# Patient Record
Sex: Female | Born: 1935 | Race: Black or African American | Hispanic: No | Marital: Married | Smoking: Former smoker
Health system: Southern US, Community
[De-identification: ages and names within clinical notes are randomized; demographics above are authoritative.]

## PROBLEM LIST (undated history)

## (undated) DIAGNOSIS — K5909 Other constipation: Secondary | ICD-10-CM

## (undated) DIAGNOSIS — I2510 Atherosclerotic heart disease of native coronary artery without angina pectoris: Secondary | ICD-10-CM

## (undated) DIAGNOSIS — E785 Hyperlipidemia, unspecified: Secondary | ICD-10-CM

## (undated) DIAGNOSIS — J189 Pneumonia, unspecified organism: Secondary | ICD-10-CM

## (undated) DIAGNOSIS — D649 Anemia, unspecified: Secondary | ICD-10-CM

## (undated) DIAGNOSIS — I6529 Occlusion and stenosis of unspecified carotid artery: Secondary | ICD-10-CM

## (undated) DIAGNOSIS — I10 Essential (primary) hypertension: Secondary | ICD-10-CM

## (undated) DIAGNOSIS — M1990 Unspecified osteoarthritis, unspecified site: Secondary | ICD-10-CM

## (undated) DIAGNOSIS — R05 Cough: Secondary | ICD-10-CM

## (undated) DIAGNOSIS — N189 Chronic kidney disease, unspecified: Secondary | ICD-10-CM

## (undated) DIAGNOSIS — R600 Localized edema: Secondary | ICD-10-CM

## (undated) DIAGNOSIS — J8410 Pulmonary fibrosis, unspecified: Secondary | ICD-10-CM

## (undated) DIAGNOSIS — R058 Other specified cough: Secondary | ICD-10-CM

## (undated) DIAGNOSIS — Z8619 Personal history of other infectious and parasitic diseases: Secondary | ICD-10-CM

## (undated) DIAGNOSIS — I5181 Takotsubo syndrome: Secondary | ICD-10-CM

## (undated) DIAGNOSIS — R609 Edema, unspecified: Secondary | ICD-10-CM

## (undated) DIAGNOSIS — M4802 Spinal stenosis, cervical region: Secondary | ICD-10-CM

## (undated) HISTORY — DX: Pneumonia, unspecified organism: J18.9

## (undated) HISTORY — DX: Spinal stenosis, cervical region: M48.02

## (undated) HISTORY — PX: CHOLECYSTECTOMY: SHX55

## (undated) HISTORY — DX: Personal history of other infectious and parasitic diseases: Z86.19

## (undated) HISTORY — DX: Anemia, unspecified: D64.9

## (undated) HISTORY — DX: Chronic kidney disease, unspecified: N18.9

## (undated) HISTORY — DX: Atherosclerotic heart disease of native coronary artery without angina pectoris: I25.10

## (undated) HISTORY — PX: ABDOMINAL HYSTERECTOMY: SHX81

## (undated) HISTORY — DX: Takotsubo syndrome: I51.81

## (undated) HISTORY — DX: Hyperlipidemia, unspecified: E78.5

## (undated) HISTORY — DX: Occlusion and stenosis of unspecified carotid artery: I65.29

---

## 1997-07-04 ENCOUNTER — Other Ambulatory Visit: Admission: RE | Admit: 1997-07-04 | Discharge: 1997-07-04 | Payer: Self-pay | Admitting: Nephrology

## 1998-08-12 ENCOUNTER — Encounter: Payer: Self-pay | Admitting: Surgery

## 1998-08-12 ENCOUNTER — Ambulatory Visit (HOSPITAL_COMMUNITY): Admission: RE | Admit: 1998-08-12 | Discharge: 1998-08-12 | Payer: Self-pay | Admitting: Surgery

## 1998-12-22 ENCOUNTER — Encounter: Payer: Self-pay | Admitting: Nephrology

## 1998-12-22 ENCOUNTER — Ambulatory Visit (HOSPITAL_COMMUNITY): Admission: RE | Admit: 1998-12-22 | Discharge: 1998-12-22 | Payer: Self-pay | Admitting: Nephrology

## 1999-02-16 ENCOUNTER — Encounter: Admission: RE | Admit: 1999-02-16 | Discharge: 1999-02-16 | Payer: Self-pay | Admitting: Nephrology

## 1999-02-16 ENCOUNTER — Encounter: Payer: Self-pay | Admitting: Nephrology

## 1999-05-28 ENCOUNTER — Encounter: Admission: RE | Admit: 1999-05-28 | Discharge: 1999-05-28 | Payer: Self-pay | Admitting: Nephrology

## 1999-05-28 ENCOUNTER — Encounter: Payer: Self-pay | Admitting: Nephrology

## 2000-09-09 ENCOUNTER — Ambulatory Visit (HOSPITAL_COMMUNITY): Admission: RE | Admit: 2000-09-09 | Discharge: 2000-09-09 | Payer: Self-pay | Admitting: Cardiology

## 2000-09-09 ENCOUNTER — Encounter: Payer: Self-pay | Admitting: Cardiology

## 2000-11-01 ENCOUNTER — Ambulatory Visit (HOSPITAL_COMMUNITY): Admission: RE | Admit: 2000-11-01 | Discharge: 2000-11-01 | Payer: Self-pay | Admitting: Cardiology

## 2001-08-11 ENCOUNTER — Encounter: Payer: Self-pay | Admitting: Nephrology

## 2001-08-11 ENCOUNTER — Ambulatory Visit (HOSPITAL_COMMUNITY): Admission: RE | Admit: 2001-08-11 | Discharge: 2001-08-11 | Payer: Self-pay | Admitting: Nephrology

## 2001-08-14 ENCOUNTER — Ambulatory Visit (HOSPITAL_COMMUNITY): Admission: RE | Admit: 2001-08-14 | Discharge: 2001-08-14 | Payer: Self-pay | Admitting: Nephrology

## 2001-08-14 ENCOUNTER — Encounter: Payer: Self-pay | Admitting: Nephrology

## 2002-07-24 ENCOUNTER — Encounter: Admission: RE | Admit: 2002-07-24 | Discharge: 2002-07-24 | Payer: Self-pay | Admitting: Nephrology

## 2002-07-24 ENCOUNTER — Encounter: Payer: Self-pay | Admitting: Nephrology

## 2002-07-31 ENCOUNTER — Encounter: Payer: Self-pay | Admitting: Nephrology

## 2002-07-31 ENCOUNTER — Encounter: Admission: RE | Admit: 2002-07-31 | Discharge: 2002-07-31 | Payer: Self-pay | Admitting: Nephrology

## 2002-08-24 ENCOUNTER — Encounter: Payer: Self-pay | Admitting: Nephrology

## 2002-08-24 ENCOUNTER — Encounter: Admission: RE | Admit: 2002-08-24 | Discharge: 2002-08-24 | Payer: Self-pay | Admitting: Nephrology

## 2002-11-15 ENCOUNTER — Other Ambulatory Visit: Admission: RE | Admit: 2002-11-15 | Discharge: 2002-11-15 | Payer: Self-pay | Admitting: Cardiology

## 2003-10-31 ENCOUNTER — Encounter: Admission: RE | Admit: 2003-10-31 | Discharge: 2003-10-31 | Payer: Self-pay | Admitting: Nephrology

## 2003-11-27 ENCOUNTER — Ambulatory Visit (HOSPITAL_COMMUNITY): Admission: RE | Admit: 2003-11-27 | Discharge: 2003-11-27 | Payer: Self-pay | Admitting: Nephrology

## 2004-01-13 ENCOUNTER — Encounter: Admission: RE | Admit: 2004-01-13 | Discharge: 2004-01-13 | Payer: Self-pay | Admitting: Nephrology

## 2004-07-08 ENCOUNTER — Encounter: Admission: RE | Admit: 2004-07-08 | Discharge: 2004-07-08 | Payer: Self-pay | Admitting: Nephrology

## 2004-11-13 ENCOUNTER — Ambulatory Visit: Payer: Self-pay | Admitting: Internal Medicine

## 2004-11-18 ENCOUNTER — Ambulatory Visit: Payer: Self-pay | Admitting: Internal Medicine

## 2004-11-18 ENCOUNTER — Ambulatory Visit: Payer: Self-pay | Admitting: Physical Medicine & Rehabilitation

## 2004-11-18 ENCOUNTER — Inpatient Hospital Stay (HOSPITAL_COMMUNITY): Admission: RE | Admit: 2004-11-18 | Discharge: 2004-12-16 | Payer: Self-pay | Admitting: Internal Medicine

## 2004-11-28 ENCOUNTER — Encounter (INDEPENDENT_AMBULATORY_CARE_PROVIDER_SITE_OTHER): Payer: Self-pay | Admitting: Specialist

## 2005-03-04 ENCOUNTER — Other Ambulatory Visit: Admission: RE | Admit: 2005-03-04 | Discharge: 2005-03-04 | Payer: Self-pay | Admitting: Nephrology

## 2005-04-19 ENCOUNTER — Ambulatory Visit (HOSPITAL_COMMUNITY): Admission: RE | Admit: 2005-04-19 | Discharge: 2005-04-19 | Payer: Self-pay | Admitting: General Surgery

## 2005-04-22 ENCOUNTER — Ambulatory Visit (HOSPITAL_COMMUNITY): Admission: RE | Admit: 2005-04-22 | Discharge: 2005-04-22 | Payer: Self-pay | Admitting: General Surgery

## 2005-06-09 ENCOUNTER — Inpatient Hospital Stay (HOSPITAL_COMMUNITY): Admission: RE | Admit: 2005-06-09 | Discharge: 2005-06-14 | Payer: Self-pay | Admitting: General Surgery

## 2006-04-26 ENCOUNTER — Other Ambulatory Visit: Admission: RE | Admit: 2006-04-26 | Discharge: 2006-04-26 | Payer: Self-pay | Admitting: Nephrology

## 2007-06-07 ENCOUNTER — Encounter: Admission: RE | Admit: 2007-06-07 | Discharge: 2007-06-07 | Payer: Self-pay | Admitting: Nephrology

## 2007-07-07 ENCOUNTER — Encounter: Admission: RE | Admit: 2007-07-07 | Discharge: 2007-07-07 | Payer: Self-pay | Admitting: Nephrology

## 2007-07-27 ENCOUNTER — Encounter: Admission: RE | Admit: 2007-07-27 | Discharge: 2007-07-27 | Payer: Self-pay | Admitting: Nephrology

## 2008-12-29 IMAGING — CR DG ANKLE COMPLETE 3+V*R*
3 series · 3 of 3 positions shown · non-contrast
Comparison: 07/08/2004

CLINICAL DATA: Right ankle pain and swelling

RIGHT ANKLE - COMPLETE 3+ VIEW

[view not recorded (1 of 3)]
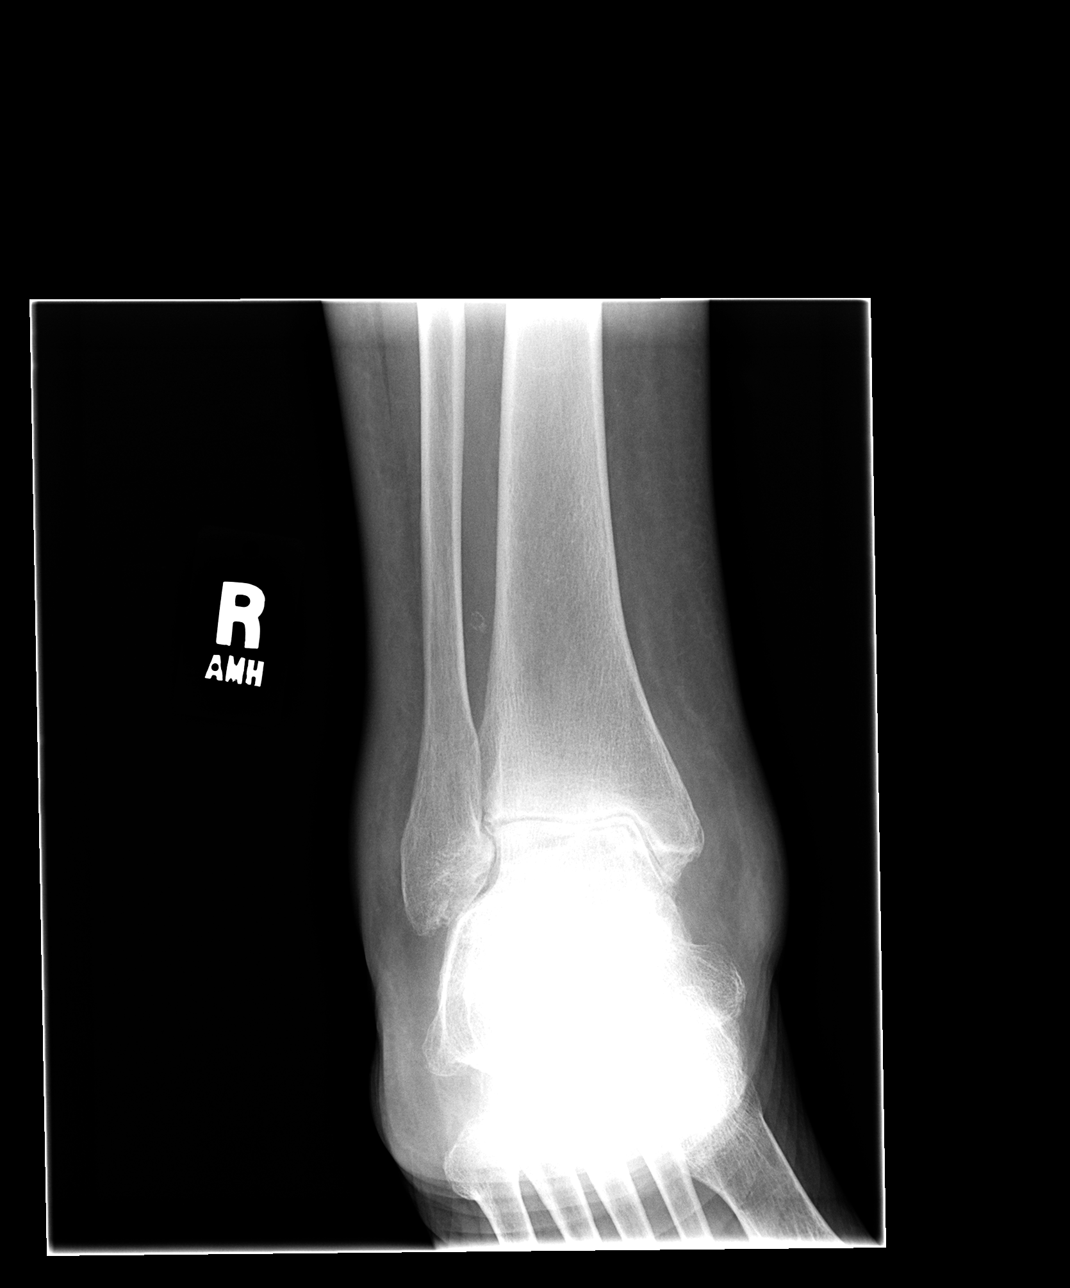

[view not recorded (2 of 3)]
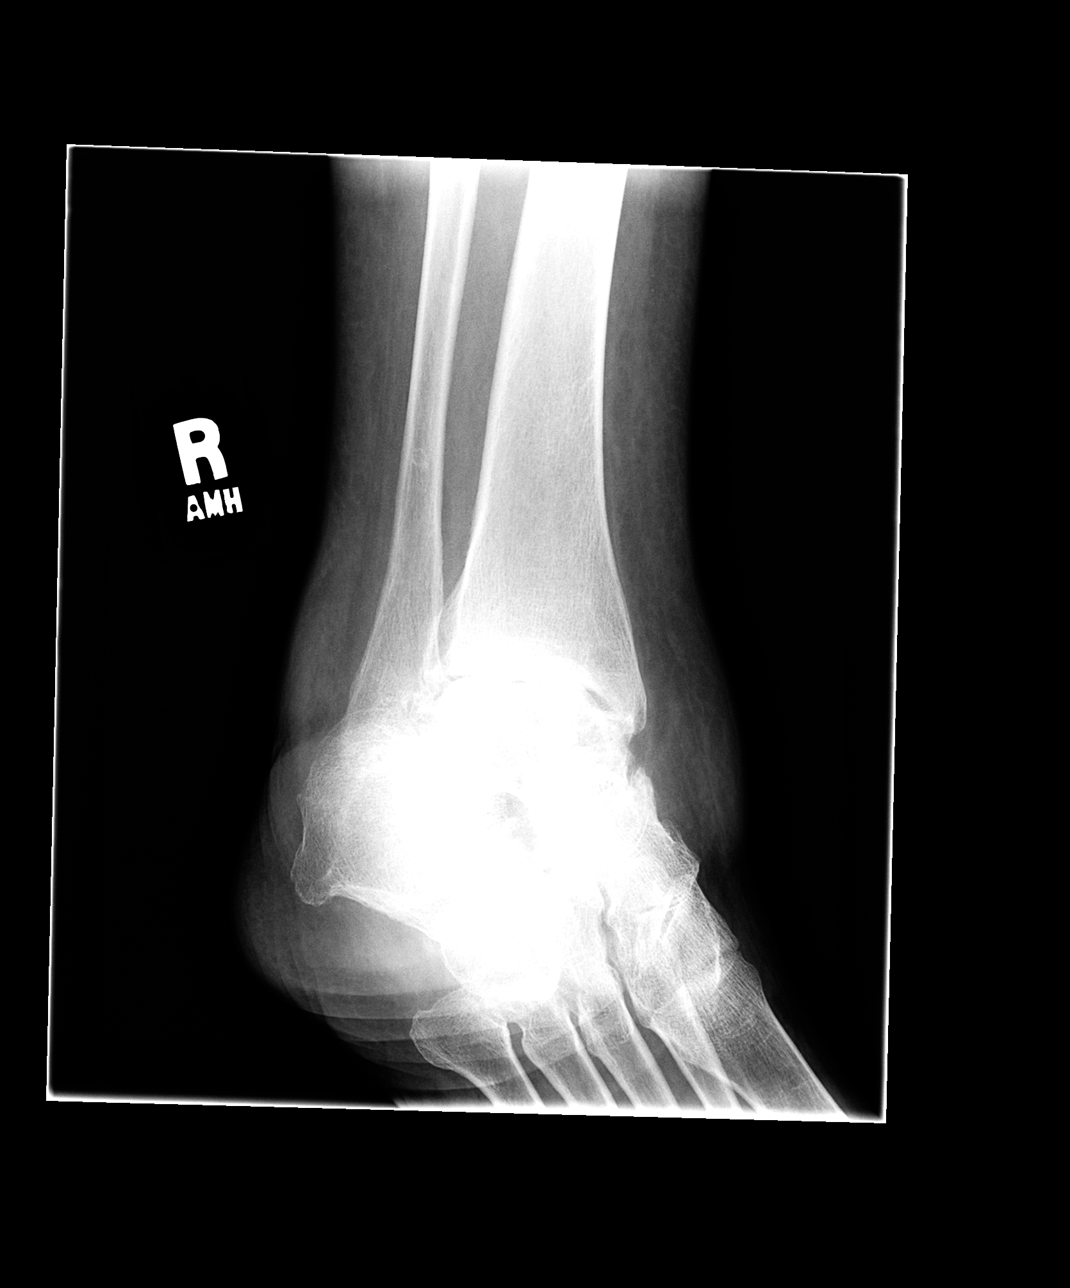

[view not recorded (3 of 3)]
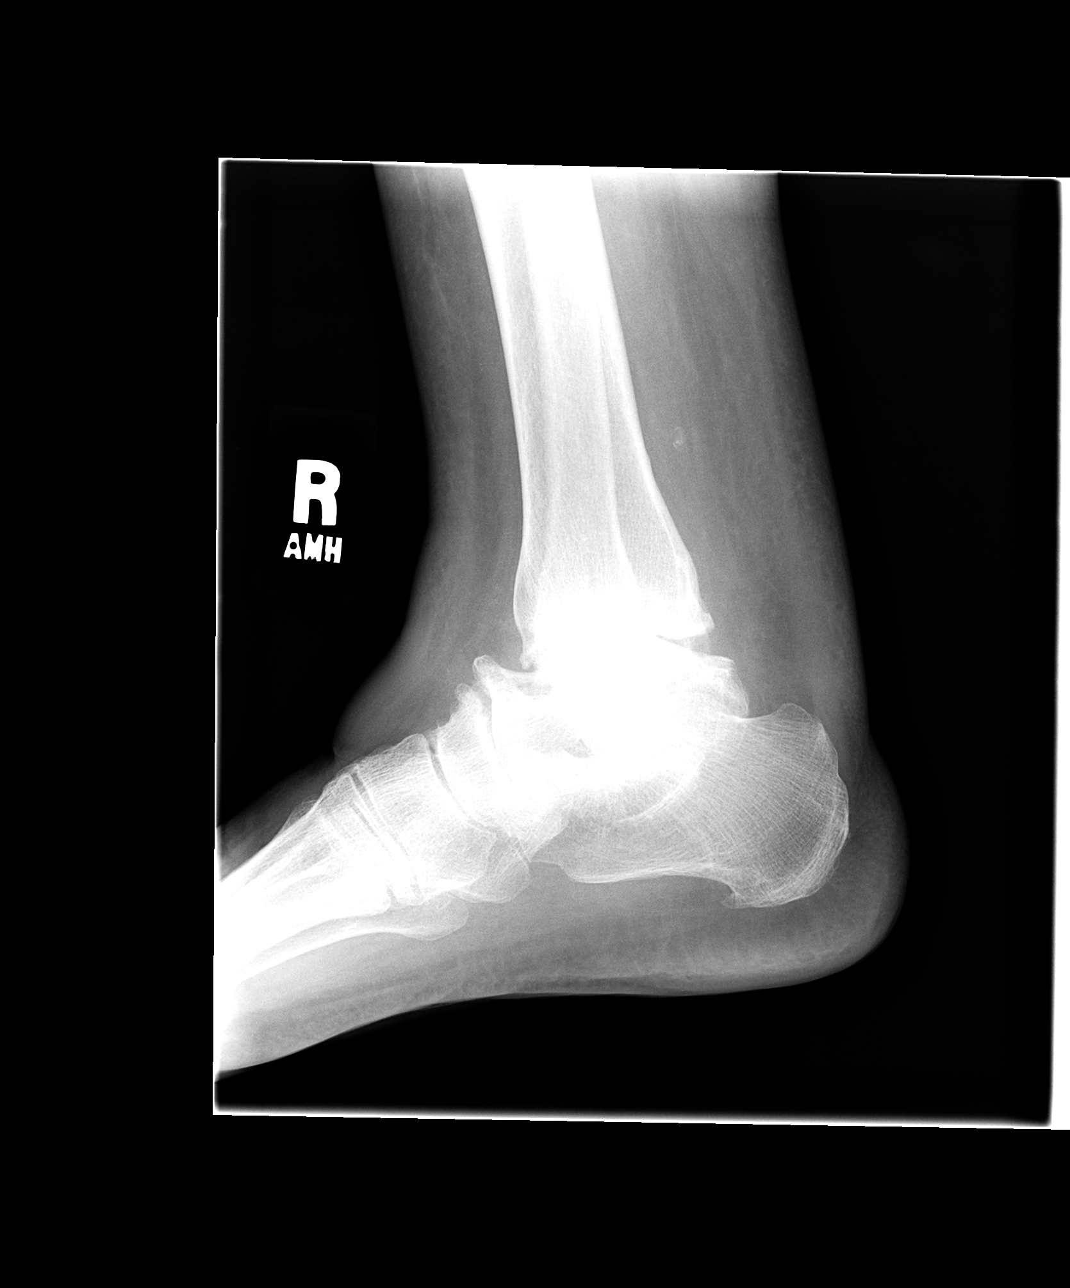

[3 of 3 positions shown; findings below may reference images not displayed]

FINDINGS: There are stable significant degenerative changes
involving the ankle with significant loss of joint space height at
the ankle mortise and proliferative changes involving the malleoli
and talus.  Diffuse soft tissue swelling present surrounding the
ankle. No evidence of fracture or dislocation.
IMPRESSION: Stable severe degenerative changes of the ankle.  Diffuse soft
tissue swelling present.

## 2009-02-06 ENCOUNTER — Encounter: Admission: RE | Admit: 2009-02-06 | Discharge: 2009-02-06 | Payer: Self-pay | Admitting: Nephrology

## 2010-06-12 NOTE — Op Note (Signed)
NAMEMATTEA, SEGER               ACCOUNT NO.:  [id number]   MEDICAL RECORD NO.:  [id number]          PATIENT TYPE:  INP   LOCATION:  1429                         FACILITY:  [facility]   PHYSICIAN:  Angelia Mould. Derrell Lolling, M.D.DATE OF BIRTH:  Jul 20, 1922   DATE OF PROCEDURE:  06/09/2005  DATE OF DISCHARGE:                                 OPERATIVE REPORT   PREOPERATIVE DIAGNOSIS:  Right transverse loop colostomy, status post repair  of rectosigmoid perforation with abscess.   POSTOPERATIVE DIAGNOSIS:  Right transverse loop colostomy, status post  repair of rectosigmoid perforation with abscess.   OPERATION PERFORMED:  Closure of the right transverse loop colostomy.   SURGEON:  Angelia Mould. Derrell Lolling, M.D.   FIRST ASSISTANT:  Anselm Pancoast. Zachery Dakins, M.D.   OPERATIVE INDICATIONS:  This is an 77 year old black female in reasonably  good health.  She was operated upon as an emergency on 11/18/04 for a  colonoscopic perforation of the proximal rectum.  That was repaired  primarily but in the postoperative period, that leaked and she developed a  pelvic abscess and we had to go back and drain the abscess and performed a  right transverse loop colostomy.  She did recover from that and we have  waited several months for her to get completely well.  She is now quite well  and fully active and wants to have her colostomy closed.  We evaluated  colostomy with flexible sigmoidoscopy and barium enema per rectum and per  ostomy and everything looks reasonably normal.  She is brought to operating  room following a two-day bowel prep at home.   OPERATIVE TECHNIQUE:  Following induction of general endotracheal  anesthesia, Foley catheter was inserted.  Intravenous antibiotics were  given.  The patient was identified.  The abdomen was prepped and draped in  sterile fashion.  The right transverse loop colostomy was observed in the  right upper quadrant.  There was an elliptical shaped opening there.  With a  sharp knife, we cut right around the edge of this and then began slowly to  mobilize the colon away from the subcutaneous tissue.  We did this with  electrocautery and sharp and blunt dissection.  Dissection was carried down  to the fascia where we dissected it away from the fascia slowly.  We had  used a lot of traction and countertraction but ultimately we were able to  completely free the colon from the underside of the fascia to where it was  completely mobile.  We closed the stoma transversely with interrupted  sutures of 3-0 silk.  We placed corner stitches of 2-0 silk to set this up  and then placed interrupted sutures of 3-0 silk in an interrupted inverting  fashion.  After all the sutures were tied, it looked good.  We compressed  the colon on each side and it had no air leak.  The colon was pink and  healthy and had bled fairly well throughout the procedure.  The anastomosis  lumen appeared to be about one and one half finger widths.  We washed out  the wound, checked for  bleeding and there was none.  We painted the suture  line with Tisseel and at that harden for about 4 or 5 minutes.  We then  dropped colon back in the abdominal cavity.  We closed the fascia  transversely with about 8 interrupted sutures of  #1 Novofil.  We irrigated the wound and closed the skin loosely with a few  staples and placed Telfa wicks in between for drainage.  Clean bandage was  placed.  The patient taken recovery in stable condition.  Estimated blood  loss was about 40-50 mL.  Complications none. Sponge, needle and instrument  counts were correct.      Angelia Mould. Derrell Lolling, M.D.  Electronically Signed     HMI/MEDQ  D:  06/09/2005  T:  06/10/2005  Job:  [id number]   cc:   Jarome Matin, M.D.  Fax: [phone]   Lina Sar, M.D. [facility]  [address]  [place]  [place] [address]

## 2010-06-12 NOTE — H&P (Signed)
Alicia [name], Alicia [name]               ACCOUNT NO.:  [id number]   MEDICAL RECORD NO.:  [id number]          PATIENT TYPE:  INP   LOCATION:  0101                         FACILITY:  [facility]   PHYSICIAN:  Angelia Mould. Derrell Lolling, M.D.DATE OF BIRTH:  December 22, 1922   DATE OF ADMISSION:  11/18/2004  DATE OF DISCHARGE:                                HISTORY & PHYSICAL   CHIEF COMPLAINT:  Abdominal pain and suspected perforated viscus.   HISTORY OF PRESENT ILLNESS:  This is a 77 year old black female who has had  a problem with chronic constipation but no other major GI problems. She  underwent elective colonoscopy this morning for evaluation of her chronic  constipation. Dr. Lina Sar called me and told her that she thinks that  she tore the colon at about 25 cm. She sent the patient to the emergency  room where an abdominal x-ray shows bilateral subdiaphragmatic air  consistent with a perforated viscus. The patient complains of lower  abdominal pain but no nausea. She is admitted to the hospital for urgent  surgery.   PAST SURGICAL HISTORY:  She thinks she has been told she had diverticulosis.  She has chronic constipation. She has had a total abdominal hysterectomy and  bilateral salpingo-oophorectomy through a lower midline incision many years  ago. She had a laparoscopic cholecystectomy by Dr. Lurene Shadow a few years ago  otherwise no real major medical problems.   CURRENT MEDICATIONS:  None.   ALLERGIES:  PENICILLIN causes a rash. She is intolerant of aspirin.   FAMILY HISTORY:  Father died of old age, mother died of some type of  abdominal cancer.   SOCIAL HISTORY:  The patient is married, lives in [place], her husband is  with her today, they have one son. They are retired. Denies the use of  alcohol or tobacco.   REVIEW OF SYMPTOMS:  A 15 system review of systems is performed and is  noncontributory except as described above.   PHYSICAL EXAMINATION:  GENERAL:  Pleasant, older, black  female in mild  distress. She is cooperative and alert.  VITAL SIGNS:  Temperature 97.7, pulse 80, respirations 20, blood pressure  143/50. Oxygen saturation 94% on room air.  HEENT:  Eyes sclera clear, extraocular movements intact. Ears, nose, mouth  and throat, nose, lips, tongue and oropharynx are without gross lesions. She  has upper and lower dentures.  NECK:  Supple, nontender, no mass, no bruit, no jugular venous distention.  LUNGS:  Clear to auscultation, no chest wall tenderness.  HEART:  Regular rate and rhythm, no murmur. Radial, femoral and dorsalis  pedis pulse are palpable bilaterally.  BREASTS:  Not examined.  ABDOMEN:  Slightly obese, reasonably soft with diminished bowel sounds. She  is tender across the lower abdomen with some guarding but there is no  rigidity. There is a lower midline scar. No palpable mass. Liver and spleen  not enlarged. No inguinal mass.  EXTREMITIES:  She moves all four extremities reasonably well without pain or  deformity.  NEUROLOGIC:  No gross motor or sensory deficits.   ASSESSMENT:  1.  Perforated viscus with  pneumoperitoneum. Suspect a developing bacterial      peritonitis secondary to sigmoid colon perforation.  2.  Status post total abdominal hysterectomy and bilateral salpingo-      oophorectomy.  3.  Status post laparoscopic cholecystectomy.   PLAN:  The patient to be taken to the operating room for exploratory  laparotomy either primary repair or if the contamination is severe resection  and diverting colostomy.   I have discussed the indications and details of surgery with the patient and  her husband. The risks and complications have been outlined, including but  not limited to bleeding, infection, postoperative abscess, injury to  adjacent organs such as the ureter, bladder, major vascular structures,  major reconstructive surgery, wound problems such as infection, hernia or  dehiscence, cardiac, pulmonary and thromboembolic  problems. She seems to  understand these issues well. All of her questions are answered. She seems  to understand these issues and is in full agreement with the plan.      Angelia Mould. Derrell Lolling, M.D.  Electronically Signed     HMI/MEDQ  D:  11/18/2004  T:  11/18/2004  Job:  [id number]   cc:   Jarome Matin, M.D.  Fax: [phone]   Lina Sar, M.D. [facility]  [address]  [place]  [place] [address]

## 2010-06-12 NOTE — Discharge Summary (Signed)
[name], [name]               ACCOUNT NO.:  [id number]   MEDICAL RECORD NO.:  [id number]           PATIENT TYPE:   LOCATION:                                 FACILITY:   PHYSICIAN:  Angelia Mould. Derrell Lolling, M.D.     DATE OF BIRTH:   DATE OF ADMISSION:  06/09/2005  DATE OF DISCHARGE:  06/14/2005                                 DISCHARGE SUMMARY   FINAL DIAGNOSES:  1.  Functioning right transverse loop colostomy.  2.  Status post perforation of rectosigmoid colon following colonoscopy      November 18, 2004.  3.  Status post laparotomy with drainage of pelvic abscess and partial      omentectomy and right transverse loop colostomy November 28, 2004.   OPERATION PERFORMED:  Closure of right transverse loop colostomy on Jun 09, 2005.   HISTORY:  This is a 77 year old black female in reasonably good health.  She  was operated upon emergently on October 25,2 006 for a colonic perforation  of the proximal rectum right lateral wall following a colonoscopy.  That was  repaired primarily, but in the postoperative period that repair broke down  and leaked and she developed a pelvic abscess; and ultimately we had to re-  explore her to drain the abscess and perform a right transverse loop  colostomy.  She recovered from that surgery; and we have waited several  months for her to get completely well.  We have evaluated the colon with  barium enema and endoscopy and have found no stricture or leak.  She has  undergone a bowel prep at home; and was brought to hospital electively for  closure of her right transverse loop colostomy.   HOSPITAL COURSE:  On the day of admission the patient was taken to the  operating room.  We found that we were able to mobilize the transverse colon  through the right upper quadrant wound and simply close the colostomy with  interrupted sutures, closing the colon transversely.  We were able to do  this without narrowing the lumen; and we simply closed the abdominal  wall  layers.  Postoperatively she did well.  Foley catheter was removed on  postoperative day #1 and she began ambulating.  She started on a liquid diet  on Jun 13, 2005, got a little gaseous, but did okay.  She had a little bit  of chest discomfort on May 19, but EKG with cardiac enzymes were normal; and  it was thought that this was more likely due to a reflux; and with antacids  she became asymptomatic.  On Jun 14, 2005 she was ready to go home.  She was tolerating a solid diet, and had  several stools per rectum.  Her wound looked good.  Her pain was under good  control.  Her staples were removed and Steri-Strips were applied.  She was  given a prescription for Darvocet N 100.  She was given an appointment to  follow-up with me in 2 weeks.      Angelia Mould. Derrell Lolling, M.D.  Electronically Signed  HMI/MEDQ  D:  06/28/2005  T:  06/28/2005  Job:  [id number]   cc:   Jarome Matin, M.D.  Fax: [phone]   Lina Sar, M.D. [facility]  [address]  [place]  [place] [address]

## 2010-06-12 NOTE — Op Note (Signed)
Alicia [name], Alicia [name]               ACCOUNT NO.:  [id number]   MEDICAL RECORD NO.:  [id number]          PATIENT TYPE:  INP   LOCATION:  0101                         FACILITY:  [facility]   PHYSICIAN:  Angelia Mould. Derrell Lolling, M.D.DATE OF BIRTH:  19-Feb-1922   DATE OF PROCEDURE:  11/18/2004  DATE OF DISCHARGE:                                 OPERATIVE REPORT   PREOPERATIVE DIAGNOSIS:  Perforated viscus.   POSTOPERATIVE DIAGNOSIS:  Perforation of rectosigmoid colon.   OPERATION PERFORMED:  Exploratory laparotomy, lysis of adhesions,  colonorrhaphy x1, rigid proctoscopy.   SURGEON:  Angelia Mould. Derrell Lolling, M.D.   OPERATIVE INDICATIONS:  This is a 77 year old black female who underwent  elective colonoscopy today because of a chronic constipation. Dr. Lina Sar felt that she tore the colon at about 25 cm and abandoned the  procedure. The patient went to the emergency room and had abdominal x-rays  which showed significant pneumoperitoneum. I came to evaluate the patient  and found that she was stable but with significant lower abdominal  tenderness consistent with a perforation of the rectosigmoid colon. She was  given IV fluid resuscitation, intravenous antibiotics and was counseled  regarding surgery as was her husband. She agreed to this plan and is brought  to the operating room urgently.   OPERATIVE FINDINGS:  There was a perforation on the right side of the  rectosigmoid junction. After untangling the colon, this was just above the  sacral promontory, but probably was below the sacral promontory at the time  of the perforation. There were extensive small bowel adhesions in the pelvis  which were chronic but there was no primary disease of the small bowel.  There were extensive omental adhesions to the intra-abdominal wall and the  pelvis which were chronic and all were taken down. The mid and proximal  sigmoid colon looked perfectly normal. There was cloudy fluid throughout the  pelvis but  did it did not have any odor and there was no gross fecal  soilage.   OPERATIVE TECHNIQUE:  Following the induction of general endotracheal  anesthesia, a Foley catheter and a nasogastric tube were placed. The abdomen  was prepped and draped in a sterile fashion. The patient had been given  broad-spectrum antibiotics preoperatively. A lower midline incision was made  through the previous scar. The fascia was incised in the midline and the  abdominal cavity entered under direct vision. We had to spend about 20-30  minutes taking down the omental adhesions to the anterior abdominal wall and  in the pelvis. We also had to take down some adhesions of the small bowel  where it was stuck to the pelvis but we also got all of that freed up  without any incident or problem. We mobilized the proximal rectum and distal  sigmoid by dividing the lateral peritoneal attachments so that we could  freely examine this. We found the perforation on the right side of the  rectosigmoid junction between a couple of large appendices epiploica. We  inspected the sigmoid and the distal rectum and found no other evidence of  injury. We  spent some time dissecting the pericolonic fat and appendices  epiploica away from the perforation until we could clearly see all the edges  all the way around for a good closure. This looked healthy, there was no  ischemia. After irrigating this area, we closed it with about 6 or 7  interrupted sutures of 2-0 silk with taking wide bites. These sutures were  placed transversely and did not compromise the lumen.   We then irrigated out the lower abdomen and pelvis more extensively. There  was no bleeding or any signs of any soilage whatsoever. We placed Tisseel  tissue sealant all around the closure and let that harden. I then put her up  in stirrups and performed rigid proctoscopy up to about 15 cm and saw no  abnormalities. I insufflated the colon until it was insufflated well  above  the repair and there was no air leak or air bubbles. We removed as much air  as we could.   I then regowned and regloved. We irrigated a little bit more. We returned  the omentum to its anatomic position. The midline fascia was closed with a  running suture of #1 PDS. The wound was irrigated with saline and I closed  the skin with a few skin staples but placed Telfa wicks between the skin  staples for drainage. A clean bandage was placed. The patient tolerated the  procedure well and was taken to the recovery room in stable condition.  Estimated blood loss was about 150 mL. Complications none. Sponge, needle  and instrument counts were correct.      Angelia Mould. Derrell Lolling, M.D.  Electronically Signed     HMI/MEDQ  D:  11/18/2004  T:  11/19/2004  Job:  [id number]   cc:   Jarome Matin, M.D.  Fax: [phone]   Lina Sar, M.D. [facility]  [address]  [place]  [place] [address]

## 2010-06-12 NOTE — Op Note (Signed)
Alicia [name], Alicia [name]               ACCOUNT NO.:  [id number]   MEDICAL RECORD NO.:  [id number]          PATIENT TYPE:  INP   LOCATION:  1419                         [facility]ACILITY:  [facility]   PHYSICIAN:  Angelia Mould. Derrell Lolling, M.D.DATE OF BIRTH:  [birth year]   DATE OF PROCEDURE:  11/28/2004  DATE OF DISCHARGE:                                 OPERATIVE REPORT   PREOPERATIVE DIAGNOSIS:  Pelvic abscess secondary to leak of colorrhaphy,  recent colonoscopic perforation of rectosigmoid colon.   POSTOPERATIVE DIAGNOSIS:  Pelvic abscess secondary to leak of colorrhaphy,  recent colonoscopic perforation of rectosigmoid colon.   OPERATION PERFORMED:  1.  Exploratory laparotomy, drainage of pelvic abscess.  2.  Partial omentectomy.  3.  Application of Tisseel to rectosigmoid leak.  4.  Right transverse loop colostomy.   SURGEON:  Dr. Claud Kelp.   FIRST ASSISTANT:  Dr. Avel Peace   OPERATIVE INDICATIONS:  This is a 77 year old African-American female, who  underwent elective colonoscopy on November 18, 2004.  A tear of the  rectosigmoid colon estimated at 25 cm was immediately recognized.  The  patient was transferred to the hospital where abdominal x-rays showed  pneumoperitoneum.  I saw the patient thereafter and found that she had a  tender abdomen, and she was taken to the operating room.  On that date, I  found that she had a single perforation of the rectosigmoid colon on the  right side.  The colon otherwise appeared reasonably healthy and given that  she had a bowel prep, I elected to debride the edges of the small wound and  closed it primarily with interrupted silk sutures and Tisseel.  The patient  initially did well but developed fever and was found to have a pelvic  abscess.  This area was drained 48 hours ago by the radiologist and was  found to contain feces material.  The abscess was walled off, and our  radiology department felt that this might control the abscess and even  allow  the rectum to heal.  The patient continued to feel somewhat poorly, although  her abdomen was not very tender from this walled off abscess.  I repeated  her CT scan today, and she still had a large abscess.  I felt that she would  need better drainage and diversion of the fecal stream to recover from this.   OPERATIVE FINDINGS:  The patient had a pelvic abscess which was well walled  off and contained mostly feces-type material.  This was very liquid.  I  examined the suture line in the rectosigmoid which was part of the wall of  the abscess, and there appeared to be a small defect in the closure.  The  colon was quite rigid and rubbery, and I did not feel that it could be  sewed.  The rest of the small bowel and colon looked fairly good except for  that which was involving the wall of the abscess cavity.  There was no  significant bleeding or hematoma.  Pigtail catheter was noted in the pelvis  and was removed.   OPERATIVE TECHNIQUE:  Following the induction of general endotracheal  anesthesia, the patient's abdomen was prepped and draped in a sterile  fashion.  I reopened the midline laparotomy incision and carefully entered  the abdominal cavity.  The adhesions were not too severe, and I was able to  gently bluntly take the adhesions of the omentum and small bowel away from  the anterior abdominal wall without any trauma.  We slowly took the  adhesions down and went down into the pelvis and entered the abscess cavity.  We identified the pigtail catheter and cut it off and removed the  intrapelvic part.  We washed this out fairly extensively, and also we washed  out the rest of the abdominal cavity, although there really did not appear  to be any infection elsewhere in the abdominal or subphrenic spaces.  We did  irrigate with several liters.   I did inspect the suture line on the right side of the rectosigmoid colon.  There was no obvious active leak but upon closer  inspection, I found a small  opening at the edge of the suture line.  I packed the pelvis with gauze.   We went above and in the upper abdomen and found the transverse colon.  It  was quite soft and mobile.  I chose to do a right transverse loop colostomy.  I made a transverse incision about 6 cm below the right costal margin.  The  fascia was incised transversely.  The muscle was simply split, and the  posterior rectus sheath was incised transversely.  We took the right  transverse colon and cleaned some of the omentum away and brought it out  through the transverse incision in the right upper quadrant.  A plastic  bridge was placed under this, and the bridge was sutured to the skin with  nylon sutures.  We were able to do this without much tension, and the colon  was quite healthy and viable.   The lower end of the omental apron had been part of the abscess wall and it  had continued to bleed, and so we felt that we had to amputate that part of  the omentum.  This was done by placing several Kelly clamps across this area  of the distal omentum, amputating the bleeding part of the omentum and  ligating the omental vessels with 2-0 silk ties.  This provided very good  closure and good hemostasis.  The omental specimen was sent to the lab.   We then took the packing out of the pelvis and took a look everything.  There really did not appear to be any bleeding.  We irrigated it out fairly  thoroughly one more time.  We placed Tisseel on the colon or at the suture  line in hopes of obtaining a good seal there.  We placed two 19-French Blake  drains in the pelvis.  One was brought out at the right lower quadrant, and  one was brought out at the left lower quadrant and sutured to the skin with  nylon sutures and hooked to suction bulbs.  The small bowel and omentum were  inserted in their anatomic positions.  The midline fascia was closed with a running suture of #1 PDS as well as 5  interrupted retention sutures of #5  Ethibond tied over red rubber catheter bolsters.  We packed this wound with  saline moistened gauze.   We matured the right transverse loop colostomy with about 8-10 interrupted  sutures of 3-0  Vicryl.  The colon was quite pink and healthy and bled  easily.  I was able to pass my finger down both proximal and distal limbs.  A  colostomy appliance was placed.  Clean bandages were placed over the midline  wound.  The patient tolerated the procedure well and was taken to the  recovery room in stable condition.  She was extubated and was stable.  Estimated blood loss was about 100 mL.  Complications none.  Sponge, needle,  and instrument counts were correct.      Angelia Mould. Derrell Lolling, M.D.  Electronically Signed     HMI/MEDQ  D:  11/28/2004  T:  11/28/2004  Job:  [id number]   cc:   Jarome Matin, M.D.  Fax: [phone]   Lina Sar, M.D. [facility]  [address]  [place]  [place] [address]

## 2010-06-12 NOTE — Cardiovascular Report (Signed)
[place]  Patient:    Alicia, [name] Visit Number: [id number] MRN: [id number]          Service Type: CAT Location: [facility] 2860 01 Attending Physician:  Robynn Pane Dictated by:   Eduardo Osier Sharyn Lull, M.D. Proc. Date: 11/01/00 Admit Date:  11/01/2000   CC:         Jarome Matin, M.D.  Cardiac Catheterization Laboratory   Cardiac Catheterization  PROCEDURE: Left cardiac catheterization with selective right and left coronary angiography, left ventriculography via right groin using Judkins technique.  INDICATIONS FOR PROCEDURE: The patient is a 77 year old, black female with a past medical history significant for hypertension, arthritis, morbid obesity, complains of vague precordial chest pain radiating to left arm without any associated symptoms. The patient also gives history of exertional dyspnea and feeling weak and tired and fatigues easily. The patient denies any diaphoresis, nausea, or vomiting. Denies lightheadedness, palpitations or syncope. The patient underwent a Persantine Cardiolite on August 16, which showed a small area of reversible ischemia in the inferolateral wall with EF of 68%.  PAST MEDICAL HISTORY: As above plus history of legionnaires disease.  PAST SURGICAL HISTORY: She had a hysterectomy 30 years ago. She had a cholecystectomy approximately 12 years ago.  ALLERGIES: She is allergic to PENICILLIN, she gets rash. She has an intolerance to ASPIRIN and CODEINE.  SOCIAL HISTORY: She is married and has one son.  Smoked one-pack per day for 15 years, quit about 21 years ago. She used to drink socially. 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  FAMILY HISTORY: Positive for cancer.  PHYSICAL EXAMINATION: On examination, she is alert, awake, oriented x3 in no acute distress.  Blood pressure was 160/90, pulse was 76, regular. Conjunctiva was pink. Neck:  Supple, no JVD, no bruit.  Lungs are clear to auscultation without rhonchi or rales.  Cardiovascular examination: S1, S2 was normal. There was no S3 gallop. Abdomen: Soft, bowel sounds are present, obese, nontender. Extremities: There is no clubbing, cyanosis or edema.  IMPRESSION: New onset angina, positive Persantine Cardiolite, hypertension, history of tobacco abuse, morbid obesity, osteoarthritis.  PLAN: Discussed with patient and husband various options of treatment, i.e., medical versus invasive, its risks, i.e. death, MI, stroke, need for emergency CABG, risk of restenosis, local vascular complications, etc., and consented for the procedure.  DESCRIPTION OF PROCEDURE: After obtaining the informed consent, the patient was brought to the catheterization lab and was placed on the fluoroscopy table.  The right groin was prepped and draped in the usual fashion. Xylocaine 2% was used for local anesthesia in the right groin. With the help of a thin-walled needle, a 6 French arterial sheath was placed.  The sheath was aspirated and flushed.  Next, a 6 French left Judkins catheter was advanced over the wire under fluoroscopic guidance up to the ascending aorta. The wire was pulled out, the catheter was aspirated and connected to the manifold. The catheter was further advanced and engaged into the left coronary ostium. Multiple views of the left system were taken. Next, the catheter was disengaged and was pulled out over the wire and was replaced with a 6 French right Judkins catheter which was advanced over the wire under fluoroscopic guidance up to the ascending aorta. The wire was pulled out, the catheter was aspirated and connected to the manifold. The catheter was further advanced and engaged into right coronary ostium.  Multiple views of  the right system were taken. Next, the catheter was disengaged and was pulled out over the wire and was replaced with a 6 French pigtail catheter  which was advanced over the wire under fluoroscopic guidance up to the ascending aorta.  The wire was pulled out, the catheter was aspirated and connected to the manifold.  The catheter was further advanced across the aortic valve into the LV.  LV pressures were recorded. Next, LV graph was done in 30 degree RAO position. Post cineangiographic pressures were recorded from LV and then pullback pressures were recorded from aorta. There was no gradient across the aortic valve. Next, a pigtail catheter was pulled out, sheaths were aspirated and flushed.  FINDINGS: LV: The patient has good left ventricular systolic function, EF of 60-65%.  The left main is patent.  LAD has mild disease in the midportion.  Diagonal #1 and diagonal #2 are very small which is patent.  Left circumflex is large which is patent.  OM-1 is medium sized which is patent.  OM-2 is small which is patent.  OM-3 is large which is patent.  RCA was patent which was nondominant.  The patient tolerated the procedure well. There were no complications. The patient was transferred to the recovery room in stable condition. Dictated by:   Eduardo Osier Sharyn Lull, M.D. Attending Physician:  Robynn Pane DD:  11/01/00 TD:  11/01/00 Job: [id number]

## 2010-06-12 NOTE — Discharge Summary (Signed)
Alicia [name], Alicia [name]               ACCOUNT NO.:  [id number]   MEDICAL RECORD NO.:  [id number]          PATIENT TYPE:  INP   LOCATION:  1516                         FACILITY:  [facility]   PHYSICIAN:  Angelia Mould. Derrell Lolling, M.D.DATE OF BIRTH:  [birth year]   DATE OF ADMISSION:  11/18/2004  DATE OF DISCHARGE:  12/16/2004                                 DISCHARGE SUMMARY   FINAL DIAGNOSES:  1.  Perforation of rectosigmoid colon, status post colonoscopy.  2.  Chronic constipation.  3.  Postop pelvic abscess.  4.  Hypertension.   OPERATIONS PERFORMED:  1.  Exploratory laparotomy with lysis of adhesions, colorrhaphy, proctoscopy      date November 18, 2004.  2.  Laparotomy with drainage of pelvic abscess, partial omentectomy, right      transverse loop colostomy date November 28, 2004.   HISTORY:  This is a 77 year old black female who has enjoyed reasonably good  health notable only for chronic constipation and hypertension. She underwent  elective colonoscopy on the day of admission by Dr. Lina Sar.  She was  able to pass the colonoscope only to 20 to 25 cm at which point she felt  that the mucosa had torn and the procedure was terminated.  Abdominal x-ray  showed free intraperitoneal air.  The patient was sent promptly to the  [facility] emergency room.  I was asked to see the patient.  The patient  was admitted for urgent laparotomy.   HISTORY AND PHYSICAL EXAM:  An elderly, black female in moderate distress  from abdominal pain.  She was hemodynamically stable.  The abdomen was  somewhat distended and diffusely tender, more so in the lower abdomen.   HOSPITAL COURSE:  The patient was taken to operating room emergently where  she underwent laparotomy.  It was found that she had extensive lysis of  adhesions.  Apparently, she had a small perforation at the rectosigmoid  junction about the level of the sacral promontory or, perhaps slightly  above.  This looked clean.  There was minimal  contamination.  A primary  repair was performed and Tisseel was placed over the primary repair and  proctoscopy was performed revealing no air leak.   Postoperatively, the patient was stable.  She was monitored in the intensive  care unit for a few days and then transferred to the floor.  She was  followed by Jarome Matin, M.D., her internist.  Her creatinine was  elevated to 1.9 but really never got much worse than that.  In fact, it  improved as the days went by.  About the fifth postop day, she began passing  flatus, was feeling somewhat better and was started on a clear liquid diet.  We involved physical therapy at that time.   On November 24, 2004 she had an episode of transient left anterior chest pain  which was pressure-like, not sharp, and not pleuritic. EKG showed no acute  changes.  This resolved.  Cardiac enzymes were negative and we presume that  this was reflux and intensified her proton pump inhibitors.   On November 25, 2004 she was noted to have fever up to 101 but was otherwise  stable.  The CT scan was then- obtained which showed a 8-10 cm pelvic  abscess which appeared walled off.  Since she was doing well, we chose to do  a percutaneous drainage of that which went well but it drained thin and  watery feculent material.  On November 26, 2004 follow-up CT scan showed that  the oral contrast entered the abscess cavity confirming that she had a leak,  presumably from the suture breakdown.  I discussed the options for  intervention with Glenn T. Fredia Sorrow, M.D. in interventional radiology.  He  felt that there was a reasonable chance that this could seal with  percutaneous drainage and so since the patient was alert and stable and not  too uncomfortable, we chose to do that. Follow-up CT on November 28, 2004  showed somewhat a smaller but still significant fluid and air collection  walled off in the pelvis.  The drain was occluded probably by feces.  At  that point, I  felt that we could not control this with percutaneous drains.  I advised the patient to return to the operating room for drainage of the  abscess and diverting colostomy.  She was returned the operating room on  November 28, 2004.  We drained the pelvic abscess which was walled off and  had not contaminated the rest of the abdominal cavity. We found a small  pinhole in the suture line.  This was oversewn and more to Tisseel was  placed on this and multiple drains were placed in the pelvis and a right  transverse loop colostomy was performed.   Following the surgery, she slowly slowly improved.  She was quite  deconditioned and had to have a lot of help with physical therapy and  occupational therapy.  Eventually, her colostomy started working and we were  able to resume a diet which she tolerated.  She was somewhat anemic with  hemoglobin 8.2 but seemed to be tolerating that so we started her on  Aranesp.  We got her Foley removed which she was incontinent of urine and  had to have that put back in for a few days.  We spent some time with the  physical medicine department talking to the patient and evaluating her and  talking to her husband about whether she should go home or to the rehab  unit.  Ostomy clinical specialist followed the patient for education and  that went well.  During the remainder of her hospitalization, she became  stable.  We also got her Foley out and she was voiding well with good  control.  She and her husband ultimately decided that they wanted her to go  home with home health nursing,home health PT and OT and that was arranged.  She had  retention sutures in place and we left those in place.   She was discharged on December 16, 2004.  At that time, she was tolerating  her diet well, voiding well, and was afebrile.  Her abdomen was soft and  nontender and her midline wound was almost healed with retention sutures in place.   DISCHARGE MEDICATIONS:  1.  Protonix  40 mg a day.  2.  Senokot-S at bedtime.  3.  Megace 40 mg 3 times a day.  4.  Zoloft 50 mg at bedtime.  5.  Ferrous sulfate 325 mg twice daily.  6.  Darvocet N 100.   She  was scheduled to return to see me in the office in 5-7 days.  She was  also advised to follow-up Dr. Jeri Cos for management of her medical  problems.      Angelia Mould. Derrell Lolling, M.D.  Electronically Signed     HMI/MEDQ  D:  01/05/2005  T:  01/06/2005  Job:  [id number]   cc:   Jarome Matin, M.D.  Fax: [phone]   Lina Sar, M.D. [facility]  [address]  [place]  [place] [address]   Jodi Marble. Fredia Sorrow, M.D.  Fax: [phone]

## 2010-06-12 NOTE — H&P (Signed)
Alicia [name], SWARTZENTRUBER NO.:  [id number]   MEDICAL RECORD NO.:  [id number]          PATIENT TYPE:  INP   LOCATION:  0163                         FACILITY:  [facility]   PHYSICIAN:  Lina Sar, M.D. [facility]  DATE OF BIRTH:  Dec 19, 1922   DATE OF ADMISSION:  11/18/2004  DATE OF DISCHARGE:                                HISTORY & PHYSICAL   HISTORY OF PRESENT ILLNESS:  This 77 year old African-American female was  admitted on emergency basis through the emergency room after her x-ray of  the abdomen showed marked pneumoperitoneum.  The patient underwent  outpatient colonoscopy at 8 o'clock this morning.  The exam was incomplete,  reached only 25 cm from the rectum because of severe angulation of the  sigmoid colon at the pelvic rim.  Due to a tear, the patient developed  perforation and pneumoperitoneum.  KUB confirmed the abnormality, and the  patient was admitted through the emergency room for surgical treatment.   The reason for colonoscopy was severe constipation and recent change in  bowel habits as well as positive family history of colon polyps in patient's  brother.  She was evaluated in the office on November 13, 2004, at which time  the abdominal exam was unremarkable with marked tenderness in the left lower  quadrant with stools hemoccult negative.   MEDICATIONS:  1.  Vitamin A and D.  2.  Vitamin B complex.  3.  Vitamin C.  4.  She is also treated for hypertension by Dr. Jeri Cos.   PAST MEDICAL HISTORY:  History of Legionnaire's disease 10 years ago   PAST SURGICAL HISTORY:  1.  Cholecystectomy 4 years ago.  2.  Hysterectomy 30 years ago.   FAMILY HISTORY:  Positive for prostate cancer in her brother, colon polyps  in another brother, diabetes in another brother.   SOCIAL HISTORY:  She is married and has 1 son.  She does not smoke and does  not drink alcohol.   REVIEW OF SYSTEMS:  Positive for arthritic complaints, back  pain, decreased   hearing.   PHYSICAL EXAMINATION:  VITAL SIGNS: Blood pressure 138/78, pulse 68, and  weight 196 pounds.  GENERAL:  The patient was in pain, in distress, complaining of bilateral  shoulder pain.  HEENT:  Sclerae nonicteric.  Oral cavity was normal.  LUNGS:  Clear to auscultation.  COR: Quiet precordium.  Normal S1 and normal S2.  ABDOMEN:  Soft and nondistended at this time with decreased bowel sounds and  tenderness diffusely, more so in upper quadrant.  There was no rebound at  the time of the exam.  RECTAL:  Not done at admission; the just had exam of the sigmoid colon.  Her  prep was good on examination.   IMPRESSION:  1.  Postprocedure pneumoperitoneum, most likely due to colon perforation.  2.  Diverticulosis of the left colon by history.  3.  History of high blood pressure.   PLAN:  1.  Surgical consultation has been obtained from Dr. Claud Kelp who will      see patient for surgical intervention.  2.  IV fluids.  3.  Antibiotics.   I have communicated with the family as well as Dr. Bascom Levels to notify them of  patient's status.      Lina Sar, M.D. [facility]  Electronically Signed     DB/MEDQ  D:  11/18/2004  T:  11/18/2004  Job:  [id number]   cc:   Jarome Matin, M.D.  Fax: [phone]   Eduardo Osier. Sharyn Lull, M.D.  Fax: [phone]   Angelia Mould. Derrell Lolling, M.D.  [address], [address]  [place]  [place] [address]

## 2013-09-25 ENCOUNTER — Ambulatory Visit
Admission: RE | Admit: 2013-09-25 | Discharge: 2013-09-25 | Disposition: A | Discharge status: Home / Self Care | Payer: Medicare Other | Source: Ambulatory Visit | Attending: Internal Medicine | Admitting: Internal Medicine

## 2013-09-25 ENCOUNTER — Other Ambulatory Visit: Payer: Self-pay | Admitting: Internal Medicine

## 2013-09-25 DIAGNOSIS — R05 Cough: Secondary | ICD-10-CM

## 2013-09-25 DIAGNOSIS — R059 Cough, unspecified: Secondary | ICD-10-CM

## 2013-10-08 ENCOUNTER — Encounter: Payer: Self-pay | Admitting: Internal Medicine

## 2013-10-08 ENCOUNTER — Ambulatory Visit (INDEPENDENT_AMBULATORY_CARE_PROVIDER_SITE_OTHER): Payer: Medicare Other | Admitting: Internal Medicine

## 2013-10-08 VITALS — BP 142/70 | HR 72 | Temp 98.2°F | Ht 64.5 in | Wt 140.0 lb

## 2013-10-08 DIAGNOSIS — J8410 Pulmonary fibrosis, unspecified: Secondary | ICD-10-CM | POA: Insufficient documentation

## 2013-10-08 DIAGNOSIS — R0600 Dyspnea, unspecified: Secondary | ICD-10-CM

## 2013-10-08 DIAGNOSIS — R058 Other specified cough: Secondary | ICD-10-CM

## 2013-10-08 DIAGNOSIS — R059 Cough, unspecified: Secondary | ICD-10-CM

## 2013-10-08 DIAGNOSIS — R0609 Other forms of dyspnea: Secondary | ICD-10-CM

## 2013-10-08 DIAGNOSIS — R0989 Other specified symptoms and signs involving the circulatory and respiratory systems: Secondary | ICD-10-CM

## 2013-10-08 DIAGNOSIS — R05 Cough: Secondary | ICD-10-CM

## 2013-10-08 NOTE — Patient Instructions (Addendum)
Please see patient coordinator before you leave today  to schedule sinus CT   No further chest xrays recommended Late add Based on neg ct sinus rec For drainage take chlortrimeton (chlorpheniramine) 4 mg every 4 hours available over the counter (may cause drowsiness)   Return in one month for repeat walking sats

## 2013-10-08 NOTE — Progress Notes (Signed)
   Subjective:    Patient ID: Alicia [name], female    DOB: 1922/12/05  MRN: [id number]  HPI  71 yobf quit smoking 1979 when dx of Legionaire's dz in [place] sick x 2 months with extensive scarring on cxr's dating back to 2004 involving both lower lobes referred by Dr Arther Dames to pulmonary clinic seen first on 10/08/2013 for PF  10/08/2013 1st [facility] office visit/ Wert   Chief Complaint  Patient presents with  . Pulmonary Consult    Referred by Dr Christene Slates for eval of abn cxr. Pt c/o night sweats for the past few months. She denies any respiratory co's today.   was coughing per report but now pt denies Post nasal Drainage sensation daytime only  x 3-4 m s itching sneezing ant rhinorrhea Not limited by breathing from desired activities  But very sedentary Does have overt HB  No obvious other patterns in day to day or daytime variabilty or assoc chronic cough or cp or chest tightness, subjective wheeze    No unusual exp hx or h/o childhood pna/ asthma or knowledge of premature birth.  Sleeping ok without nocturnal  or early am exacerbation  of respiratory  c/o's or need for noct saba. Also denies any obvious fluctuation of symptoms with weather or environmental changes or other aggravating or alleviating factors except as outlined above   Current Medications, Allergies, Complete Past Medical History, Past Surgical History, Family History, and Social History were reviewed in [employer] record.               Review of Systems  Constitutional: Positive for unexpected weight change. Negative for fever and chills.  HENT: Negative for congestion, dental problem, ear pain, nosebleeds, postnasal drip, rhinorrhea, sinus pressure, sneezing, sore throat, trouble swallowing and voice change.   Eyes: Negative for visual disturbance.  Respiratory: Negative for cough, choking and shortness of breath.   Cardiovascular: Negative for chest pain and leg swelling.    Gastrointestinal: Negative for vomiting, abdominal pain and diarrhea.  Genitourinary: Negative for difficulty urinating.       Acid heartburn  Indigestion  Musculoskeletal: Negative for arthralgias.  Skin: Positive for rash.  Neurological: Negative for tremors, syncope and headaches.  Hematological: Does not bruise/bleed easily.       Objective:   Physical Exam  Wt Readings from Last 3 Encounters:  10/08/13 140 lb (63.504 kg)      HEENT: edentulous, turbinates, and orophanx. Nl external ear canals without cough reflex   NECK :  without JVD/Nodes/TM/ nl carotid upstrokes bilaterally   LUNGS: no acc muscle use, clear to A and P bilaterally without cough on insp or exp maneuvers   CV:  RRR  no s3 or murmur or increase in P2, no edema   ABD:  soft and nontender with nl excursion in the supine position. No bruits or organomegaly, bowel sounds nl  MS:  warm without deformities, calf tenderness, cyanosis or clubbing  SKIN: warm and dry without lesions    NEURO:  alert, approp, no deficits    chest X-ray 09/25/13 COPD with pulmonary parenchymal scarring. There is no evidence of  CHF, pneumonia, nor suspicious masses. Further evaluation with chest  CT scanning is recommended if the patient's cough persists.       Assessment & Plan:

## 2013-10-09 ENCOUNTER — Ambulatory Visit (INDEPENDENT_AMBULATORY_CARE_PROVIDER_SITE_OTHER)
Admission: RE | Admit: 2013-10-09 | Discharge: 2013-10-09 | Disposition: A | Discharge status: Home / Self Care | Payer: Medicare Other | Source: Ambulatory Visit | Attending: Internal Medicine | Admitting: Internal Medicine

## 2013-10-09 DIAGNOSIS — R05 Cough: Secondary | ICD-10-CM

## 2013-10-09 DIAGNOSIS — R058 Other specified cough: Secondary | ICD-10-CM

## 2013-10-09 DIAGNOSIS — R059 Cough, unspecified: Secondary | ICD-10-CM

## 2013-10-10 ENCOUNTER — Encounter: Payer: Self-pay | Admitting: Internal Medicine

## 2013-10-10 ENCOUNTER — Telehealth: Payer: Self-pay | Admitting: *Deleted

## 2013-10-10 NOTE — Assessment & Plan Note (Signed)
10/08/2013  Walked @ slow pace  RA x 1 lap @ 185 ft each stopped due to  desat to 84% s sob   She is not aware when her sats are dropping and Not limited by breathing from desired activities  - she's a candidate for amb 02 but doubt she would wear it. - absence of symptoms and h/o remote severe lung injury s new changes on cxr strongly suggest this is a very longstanding problem and not likely at all to be responsive or benefit from aggressive w/u or rx so will f/u conservatively for now

## 2013-10-10 NOTE — Assessment & Plan Note (Signed)
The most common causes of chronic cough in immunocompetent adults include the following: upper airway cough syndrome (UACS), previously referred to as postnasal drip syndrome (PNDS), which is caused by variety of rhinosinus conditions; (2) asthma; (3) GERD; (4) chronic bronchitis from cigarette smoking or other inhaled environmental irritants; (5) nonasthmatic eosinophilic bronchitis; and (6) bronchiectasis.   These conditions, singly or in combination, have accounted for up to 94% of the causes of chronic cough in prospective studies.   Other conditions have constituted no >6% of the causes in prospective studies These have included bronchogenic carcinoma, chronic interstitial pneumonia, sarcoidosis, left ventricular failure, ACEI-induced cough, and aspiration from a condition associated with pharyngeal dysfunction.    Chronic cough is often simultaneously caused by more than one condition. A single cause has been found from 38 to 82% of the time, multiple causes from 18 to 62%. Multiply caused cough has been the result of three diseases up to 42% of the time.       Based on hx and exam, this is most likely:  Classic Upper airway cough syndrome, so named because it's frequently impossible to sort out how much is  CR/sinusitis with freq throat clearing (which can be related to primary GERD)   vs  causing  secondary (" extra esophageal")  GERD from wide swings in gastric pressure that occur with throat clearing, often  promoting self use of mint and menthol lozenges that reduce the lower esophageal sphincter tone and exacerbate the problem further in a cyclical fashion.   These are the same pts (now being labeled as having "irritable larynx syndrome" by some cough centers) who not infrequently have a history of having failed to tolerate ace inhibitors,  dry powder inhalers or biphosphonates or report having atypical reflux symptoms that don't respond to standard doses of PPI , and are easily confused as  having aecopd or asthma flares by even experienced allergists/ pulmonologists.   The first step is to  Rule out structural sinus dz then try antihistamines keeping in mind the ones that dry up the dry the best are the most sedating and may not be as well tolerated

## 2013-10-10 NOTE — Telephone Encounter (Signed)
Message copied by Christen Butter on Wed Oct 10, 2013  8:39 AM ------      Message from: Sandrea Hughs B      Created: Wed Oct 10, 2013  7:26 AM       Based on neg ct sinus rec      For drainage take chlortrimeton (chlorpheniramine) 4 mg every 4 hours available over the counter (may cause drowsiness)             Return in one month for repeat walking sats ------

## 2013-10-10 NOTE — Telephone Encounter (Signed)
Spoke with the pt and notified of recs per MW  Pt verbalized understanding   

## 2013-10-10 NOTE — Progress Notes (Signed)
Quick Note:  Spoke with pt and notified of results per Dr. Wert. Pt verbalized understanding and denied any questions.  ______ 

## 2013-11-05 ENCOUNTER — Ambulatory Visit (INDEPENDENT_AMBULATORY_CARE_PROVIDER_SITE_OTHER): Payer: Medicare Other | Admitting: Internal Medicine

## 2013-11-05 ENCOUNTER — Encounter: Payer: Self-pay | Admitting: Internal Medicine

## 2013-11-05 VITALS — BP 132/80 | HR 74 | Temp 98.4°F | Ht 64.0 in | Wt 139.0 lb

## 2013-11-05 DIAGNOSIS — R05 Cough: Secondary | ICD-10-CM

## 2013-11-05 DIAGNOSIS — R058 Other specified cough: Secondary | ICD-10-CM

## 2013-11-05 DIAGNOSIS — J8410 Pulmonary fibrosis, unspecified: Secondary | ICD-10-CM

## 2013-11-05 MED ORDER — FAMOTIDINE 20 MG PO TABS: ORAL_TABLET | ORAL | Status: DC

## 2013-11-05 MED ORDER — PANTOPRAZOLE SODIUM 40 MG PO TBEC: 40.0000 mg | DELAYED_RELEASE_TABLET | Freq: Every day | ORAL | Status: DC

## 2013-11-05 NOTE — Progress Notes (Signed)
Subjective:    Patient ID: Alicia Karvonenlberta S Peed, female    DOB: [birth year]  MRN: [id number]    Brief patient profile:  [age] yobf quit smoking 1979 when dx of Legionaire's dz in [place] sick x 2 months with extensive scarring on cxr's dating back to 2004 involving both lower lobes referred by Dr Arther DamesK husain to pulmonary clinic seen first on 10/08/2013 for PF   History of Present Illness  10/08/2013 1st [facility] office visit/ Jovonte Commins   Chief Complaint  Patient presents with  . Pulmonary Consult    Referred by Dr Christene SlatesKarar Hussain for eval of abn cxr. Pt c/o night sweats for the past few months. She denies any respiratory co's today.   was coughing per report but now pt denies Post nasal Drainage sensation daytime only  x 3-4 m s itching sneezing ant rhinorrhea Not limited by breathing from desired activities  But very sedentary Does not  have overt HB rec Please see patient coordinator before you leave today  to schedule sinus CT  No further chest xrays recommended Late add Based on neg ct sinus rec For drainage take chlortrimeton (chlorpheniramine) 4 mg every 4 hours available over the counter (may cause drowsiness)       11/05/2013 f/u ov/Stephone Gum re: pf/ uacs  Chief Complaint  Patient presents with  . Follow-up    Pt states that she is doing well and denies any new co's today.    Not limited by breathing from desired activities   Still has drainge / nasal drainage clear and nasal congestion complaints day > noct s flare in am or excess mucus and sinus ct neg/ no excess or purulent secretions No better on h1 to date but present x years   No obvious day to day or daytime variabilty or assoc  cp or chest tightness, subjective wheeze overt sinus or hb symptoms. No unusual exp hx or h/o childhood pna/ asthma or knowledge of premature birth.  Sleeping ok without nocturnal  or early am exacerbation  of respiratory  c/o's or need for noct saba. Also denies any obvious fluctuation of symptoms with  weather or environmental changes or other aggravating or alleviating factors except as outlined above   Current Medications, Allergies, Complete Past Medical History, Past Surgical History, Family History, and Social History were reviewed in [facility] electronic medical record.  ROS  The following are not active complaints unless bolded sore throat, dysphagia, dental problems, itching, sneezing,  nasal congestion or excess/ purulent secretions, ear ache,   fever, chills, sweats, unintended wt loss, pleuritic or exertional cp, hemoptysis,  orthopnea pnd or leg swelling, presyncope, palpitations, heartburn, abdominal pain, anorexia, nausea, vomiting, diarrhea  or change in bowel or urinary habits, change in stools or urine, dysuria,hematuria,  rash, arthralgias, visual complaints, headache, numbness weakness or ataxia or problems with walking or coordination,  change in mood/affect or memory.                   Objective:   Physical Exam  Wt Readings from Last 3 Encounters:  11/05/13 139 lb (63.05 kg)  10/08/13 140 lb (63.504 kg)   Mod  hoarse amb wf nad    HEENT: edentulous, turbinates, and orophanx. Nl external ear canals without cough reflex   NECK :  without JVD/Nodes/TM/ nl carotid upstrokes bilaterally   LUNGS: no acc muscle use, clear to A and P bilaterally without cough on insp or exp maneuvers   CV:  RRR  no  s3 or murmur or increase in P2, no edema   ABD:  soft and nontender with nl excursion in the supine position. No bruits or organomegaly, bowel sounds nl  MS:  warm without deformities, calf tenderness, cyanosis or clubbing  SKIN: warm and dry without lesions          chest X-ray 09/25/13 COPD with pulmonary parenchymal scarring. There is no evidence of  CHF, pneumonia, nor suspicious masses. Further evaluation with chest  CT scanning is recommended if the patient's cough persists.       Assessment & Plan:

## 2013-11-05 NOTE — Patient Instructions (Addendum)
Add Pantoprazole (protonix) 40 mg   Take 30-60 min before first meal of the day and Pepcid 20 mg one bedtime until return to office - this is the best way to tell whether stomach acid is contributing to your problem.    GERD (REFLUX)  is an extremely common cause of respiratory symptoms, many times with no significant heartburn at all.    It can be treated with medication, but also with lifestyle changes including avoidance of late meals, excessive alcohol, smoking cessation, and avoid fatty foods, chocolate, peppermint, colas, red wine, and acidic juices such as orange juice.  NO MINT OR MENTHOL PRODUCTS SO NO COUGH DROPS  USE SUGARLESS CANDY INSTEAD (jolley ranchers or Stover's)  NO OIL BASED VITAMINS - use powdered substitutes.    Please schedule a follow up office visit in 6 weeks, call sooner if needed

## 2013-11-05 NOTE — Assessment & Plan Note (Addendum)
Upper airway cough syndrome, so named because it's frequently impossible to sort out how much is  CR/sinusitis with freq throat clearing (which can be related to primary GERD)   vs  causing  secondary (" extra esophageal")  GERD from wide swings in gastric pressure that occur with throat clearing, often  promoting self use of mint and menthol lozenges that reduce the lower esophageal sphincter tone and exacerbate the problem further in a cyclical fashion.   These are the same pts (now being labeled as having "irritable larynx syndrome" by some cough centers) who not infrequently have a history of having failed to tolerate ace inhibitors,  dry powder inhalers or biphosphonates or report having atypical reflux symptoms that don't respond to standard doses of PPI , and are easily confused as having aecopd or asthma flares by even experienced allergists/ pulmonologists.     - Sinus CT  10/09/13> No evidence of sinusitis > rec trial of 1st gen H1 plus H2 hs and am ppi

## 2013-11-06 NOTE — Assessment & Plan Note (Signed)
-   10/08/2013  Walked @ slow pace  RA x 1 lap @ 185 ft each stopped due to  desat to 84% s sob  - 11/06/2013  Walked RA slow pace  2 laps @ 185 ft each stopped due to sat 85%  NO SOB   She walked twice as far at baseline with less desat but still in mid 80s s sob which tells me this has been a longstanding problem c/w remote lung injury that never completely healed   Nevertheless Use of PPI is associated with improved survival time and with decreased radiologic fibrosis per King's study published in AJRCCM vol 184 p1390.  Dec 2011  This may not be cause and effect, but given how universally unhelpful all the otherstudy drugs have been for pf,   rec start  rx ppi / diet/ lifestyle modification.    [name].    .[name]

## 2013-12-17 ENCOUNTER — Other Ambulatory Visit (INDEPENDENT_AMBULATORY_CARE_PROVIDER_SITE_OTHER): Payer: Medicare Other

## 2013-12-17 ENCOUNTER — Encounter: Payer: Self-pay | Admitting: Internal Medicine

## 2013-12-17 ENCOUNTER — Ambulatory Visit (INDEPENDENT_AMBULATORY_CARE_PROVIDER_SITE_OTHER): Payer: Medicare Other | Admitting: Internal Medicine

## 2013-12-17 VITALS — BP 126/64 | HR 84 | Ht 64.0 in | Wt 139.0 lb

## 2013-12-17 DIAGNOSIS — R05 Cough: Secondary | ICD-10-CM

## 2013-12-17 DIAGNOSIS — J8410 Pulmonary fibrosis, unspecified: Secondary | ICD-10-CM

## 2013-12-17 DIAGNOSIS — R058 Other specified cough: Secondary | ICD-10-CM

## 2013-12-17 LAB — CBC WITH DIFFERENTIAL/PLATELET
Basophils Absolute: 0 10*3/uL (ref 0.0–0.1)
Basophils Relative: 0.5 % (ref 0.0–3.0)
EOS ABS: 0.1 10*3/uL (ref 0.0–0.7)
EOS PCT: 1.6 % (ref 0.0–5.0)
HCT: 34.4 % — ABNORMAL LOW (ref 36.0–46.0)
Hemoglobin: 11.1 g/dL — ABNORMAL LOW (ref 12.0–15.0)
LYMPHS PCT: 20.9 % (ref 12.0–46.0)
Lymphs Abs: 1.4 10*3/uL (ref 0.7–4.0)
MCHC: 32.3 g/dL (ref 30.0–36.0)
MCV: 89.1 fl (ref 78.0–100.0)
MONO ABS: 0.5 10*3/uL (ref 0.1–1.0)
Monocytes Relative: 6.9 % (ref 3.0–12.0)
NEUTROS PCT: 70.1 % (ref 43.0–77.0)
Neutro Abs: 4.6 10*3/uL (ref 1.4–7.7)
PLATELETS: 269 10*3/uL (ref 150.0–400.0)
RBC: 3.86 Mil/uL — ABNORMAL LOW (ref 3.87–5.11)
RDW: 14.7 % (ref 11.5–15.5)
WBC: 6.6 10*3/uL (ref 4.0–10.5)

## 2013-12-17 LAB — SEDIMENTATION RATE: SED RATE: 46 mm/h — AB (ref 0–22)

## 2013-12-17 NOTE — Progress Notes (Signed)
Subjective:    Patient ID: Alicia [name], female    DOB: [birth year]  MRN: [id number]    Brief patient profile:  [age] yobf quit smoking 1979 when dx of Legionaire's dz in [place] sick x 2 months with extensive scarring on cxr's dating back to 2004 involving both lower lobes referred by Dr Arther DamesK husain to pulmonary clinic seen first on 10/08/2013 for PF   History of Present Illness  10/08/2013 1st [facility] office visit/ Alicia [name]   Chief Complaint  Patient presents with  . Pulmonary Consult    Referred by Dr Christene SlatesKarar Hussain for eval of abn cxr. Pt c/o night sweats for the past few months. She denies any respiratory co's today.   was coughing per report but now pt denies Post nasal Drainage sensation daytime only  x 3-4 m s itching sneezing ant rhinorrhea Not limited by breathing from desired activities  But very sedentary Does not  have overt HB rec Please see patient coordinator before you leave today  to schedule sinus CT  No further chest xrays recommended Late add Based on neg ct sinus rec For drainage take chlortrimeton (chlorpheniramine) 4 mg every 4 hours available over the counter (may cause drowsiness)       11/05/2013 f/u ov/Alicia [name] re: pf/ uacs  Chief Complaint  Patient presents with  . Follow-up    Pt states that she is doing well and denies any new co's today.   Not limited by breathing from desired activities   Still has drainge / nasal drainage clear and nasal congestion complaints day > noct s flare in am or excess mucus and sinus ct neg/ no excess or purulent secretions No better on h1 to date but present x years rec Chlortrimeton 4 mg every 4 hours as needed  Add Pantoprazole (protonix) 40 mg   Take 30-60 min before first meal of the day and Pepcid 20 mg one bedtime until return to office - this is the best way to tell whether stomach acid is contributing to your problem.   GERD diet   12/17/2013 f/u ov/Alicia [name] re: PF p legionaire's / pnds  Chief Complaint  Patient  presents with  . Follow-up    Pt states she is doing well and denies any new co's today.   Not limited by breathing from desired activities   Has not tried 1st gen h1 as rec for pnds / no noct or early am flares of cough or excess drainage disturbing sleep No inhalers at all     No obvious day to day or daytime variabilty or assoc excess or purulent secretions  cp or chest tightness, subjective wheeze or overt  hb symptoms. No unusual exp hx or h/o childhood pna/ asthma or knowledge of premature birth.  Sleeping ok without nocturnal  or early am exacerbation  of respiratory  c/o's or need for noct saba. Also denies any obvious fluctuation of symptoms with weather or environmental changes or other aggravating or alleviating factors except as outlined above   Current Medications, Allergies, Complete Past Medical History, Past Surgical History, Family History, and Social History were reviewed in [facility] electronic medical record.  ROS  The following are not active complaints unless bolded sore throat, dysphagia, dental problems, itching, sneezing,  nasal congestion or excess/ purulent secretions, ear ache,   fever, chills, sweats, unintended wt loss, pleuritic or exertional cp, hemoptysis,  orthopnea pnd or leg swelling, presyncope, palpitations, heartburn, abdominal pain, anorexia, nausea, vomiting, diarrhea  or change  in bowel or urinary habits, change in stools or urine, dysuria,hematuria,  rash, arthralgias, visual complaints, headache, numbness weakness or ataxia or problems with walking or coordination,  change in mood/affect or memory.                   Objective:   Physical Exam  Mod  hoarse amb wf nad  Wt Readings from Last 3 Encounters:  12/17/13 139 lb (63.05 kg)  11/05/13 139 lb (63.05 kg)  10/08/13 140 lb (63.504 kg)    Vital signs reviewed     HEENT: edentulous, turbinates, and orophanx. Nl external ear canals without cough reflex   NECK :  without  JVD/Nodes/TM/ nl carotid upstrokes bilaterally   LUNGS: no acc muscle use, clear to A and P bilaterally without cough on insp or exp maneuvers   CV:  RRR  no s3 or murmur or increase in P2, no edema   ABD:  soft and nontender with nl excursion in the supine position. No bruits or organomegaly, bowel sounds nl  MS:  warm without deformities, calf tenderness, cyanosis or clubbing  SKIN: warm and dry without lesions          chest X-ray 09/25/13 COPD with pulmonary parenchymal scarring. There is no evidence of  CHF, pneumonia, nor suspicious masses. Further evaluation with chest  CT scanning is recommended if the patient's cough persists.       Assessment & Plan:

## 2013-12-17 NOTE — Assessment & Plan Note (Addendum)
-   Sinus CT  10/09/13> No evidence of sinusitis. - 11/06/2013 rec ppi/h2 and h1 at hs - allergy profile 12/17/13 > Eos 0> IgE 38 with neg RAST  Not following instructions with use of 1st gen H1> rec she do so and f/u ENT prn

## 2013-12-17 NOTE — Patient Instructions (Addendum)
Continue  Pantoprazole (protonix) 40 mg   Take 30-60 min before first meal of the day and Pepcid 20 mg one bedtime until return to office - this is the best way to tell whether stomach acid is contributing to your problem.    GERD (REFLUX)  is an extremely common cause of respiratory symptoms, many times with no significant heartburn at all.    It can be treated with medication, but also with lifestyle changes including avoidance of late meals, excessive alcohol, smoking cessation, and avoid fatty foods, chocolate, peppermint, colas, red wine, and acidic juices such as orange juice.  NO MINT OR MENTHOL PRODUCTS SO NO COUGH DROPS  USE SUGARLESS CANDY INSTEAD (jolley ranchers or Stover's)  NO OIL BASED VITAMINS - use powdered substitutes.  For drainage take chlortrimeton (chlorpheniramine) 4 mg every 4 hours available over the counter (may cause drowsiness) -would recommend ENT evaluation if hoarsenees not better    Please remember to go to the lab   department downstairs for your tests - we will call you with the results when they are available.    Please schedule a follow up visit in 3 months but call sooner if needed

## 2013-12-18 LAB — ALLERGY FULL PROFILE
Allergen,Goose feathers, e70: <0.1 kU/L
Alternaria Alternata: <0.1 kU/L
Aspergillus fumigatus, m3: <0.1 kU/L
Bermuda Grass: <0.1 kU/L
Candida Albicans: <0.1 kU/L
Cat Dander: <0.1 kU/L
Curvularia lunata: <0.1 kU/L
Dog Dander: <0.1 kU/L
Fescue: <0.1 kU/L
G009 Red Top: <0.1 kU/L
Goldenrod: <0.1 kU/L
Helminthosporium halodes: <0.1 kU/L
IgE (Immunoglobulin E), Serum: 38 kU/L (ref <115)
Lamb's Quarters: <0.1 kU/L
Plantain: <0.1 kU/L
Stemphylium Botryosum: <0.1 kU/L

## 2013-12-21 NOTE — Assessment & Plan Note (Addendum)
-   10/08/2013  Walked @ slow pace  RA x 1 lap @ 185 ft each stopped due to  desat to 84% s sob  - 11/06/2013  Walked RA slow pace  2 laps @ 185 ft each stopped due to sat 85%  NO SOB  - 12/17/2013  Walked RA  2 laps @ 185 ft each stopped due to mild Sob but not desats/slow  pace    As in most pts with ali assoc PF, she has not progressed over time and presently  Not really  limited by restrictive dz/ no desaturation so prognosis is good and will see back for final f/u in 3 months for baseline 02 sats with ex and cxr

## 2014-02-21 ENCOUNTER — Other Ambulatory Visit: Payer: Self-pay | Admitting: Internal Medicine

## 2014-04-15 ENCOUNTER — Encounter: Payer: Self-pay | Admitting: Internal Medicine

## 2014-04-15 ENCOUNTER — Ambulatory Visit (INDEPENDENT_AMBULATORY_CARE_PROVIDER_SITE_OTHER): Payer: Medicare Other | Admitting: Internal Medicine

## 2014-04-15 ENCOUNTER — Other Ambulatory Visit (INDEPENDENT_AMBULATORY_CARE_PROVIDER_SITE_OTHER): Payer: Medicare Other

## 2014-04-15 VITALS — BP 110/84 | HR 74 | Ht 64.0 in | Wt 136.0 lb

## 2014-04-15 DIAGNOSIS — J8410 Pulmonary fibrosis, unspecified: Secondary | ICD-10-CM

## 2014-04-15 DIAGNOSIS — R058 Other specified cough: Secondary | ICD-10-CM

## 2014-04-15 DIAGNOSIS — R609 Edema, unspecified: Secondary | ICD-10-CM

## 2014-04-15 DIAGNOSIS — R0600 Dyspnea, unspecified: Secondary | ICD-10-CM | POA: Diagnosis not present

## 2014-04-15 DIAGNOSIS — R05 Cough: Secondary | ICD-10-CM | POA: Diagnosis not present

## 2014-04-15 LAB — BRAIN NATRIURETIC PEPTIDE: Pro B Natriuretic peptide (BNP): 131 pg/mL — ABNORMAL HIGH (ref 0.0–100.0)

## 2014-04-15 LAB — CBC WITH DIFFERENTIAL/PLATELET
BASOS ABS: 0 10*3/uL (ref 0.0–0.1)
BASOS PCT: 0.4 % (ref 0.0–3.0)
EOS PCT: 2.5 % (ref 0.0–5.0)
Eosinophils Absolute: 0.2 10*3/uL (ref 0.0–0.7)
HCT: 34.1 % — ABNORMAL LOW (ref 36.0–46.0)
HEMOGLOBIN: 11.1 g/dL — AB (ref 12.0–15.0)
LYMPHS PCT: 20.1 % (ref 12.0–46.0)
Lymphs Abs: 1.4 10*3/uL (ref 0.7–4.0)
MCHC: 32.7 g/dL (ref 30.0–36.0)
MCV: 87.2 fl (ref 78.0–100.0)
MONOS PCT: 7.5 % (ref 3.0–12.0)
Monocytes Absolute: 0.5 10*3/uL (ref 0.1–1.0)
NEUTROS ABS: 4.8 10*3/uL (ref 1.4–7.7)
Neutrophils Relative %: 69.5 % (ref 43.0–77.0)
Platelets: 252 10*3/uL (ref 150.0–400.0)
RBC: 3.91 Mil/uL (ref 3.87–5.11)
RDW: 14.9 % (ref 11.5–15.5)
WBC: 7 10*3/uL (ref 4.0–10.5)

## 2014-04-15 LAB — BASIC METABOLIC PANEL
BUN: 25 mg/dL — ABNORMAL HIGH (ref 6–23)
CHLORIDE: 104 meq/L (ref 96–112)
CO2: 29 meq/L (ref 19–32)
Calcium: 9.6 mg/dL (ref 8.4–10.5)
Creatinine, Ser: 1.39 mg/dL — ABNORMAL HIGH (ref 0.40–1.20)
GFR: 45.88 mL/min — ABNORMAL LOW (ref >60.00)
GLUCOSE: 93 mg/dL (ref 70–99)
POTASSIUM: 4.4 meq/L (ref 3.5–5.1)
SODIUM: 136 meq/L (ref 135–145)

## 2014-04-15 LAB — TSH: TSH: 3.16 u[IU]/mL (ref 0.35–4.50)

## 2014-04-15 MED ORDER — FAMOTIDINE 20 MG PO TABS: 20.0000 mg | ORAL_TABLET | Freq: Every day | ORAL | Status: DC

## 2014-04-15 MED ORDER — PANTOPRAZOLE SODIUM 40 MG PO TBEC: 40.0000 mg | DELAYED_RELEASE_TABLET | Freq: Every day | ORAL | Status: DC

## 2014-04-15 NOTE — Progress Notes (Signed)
Subjective:    Patient ID: Alicia [name], female    DOB: 1922-11-24  MRN: [id number]    Brief patient profile:  [age] yobf quit smoking 1979 when dx of Legionaire's dz in [place] sick x 2 months with extensive scarring on cxr's dating back to 2004 involving both lower lobes referred by Dr Arther DamesK husain to pulmonary clinic seen first on 10/08/2013 for PF   History of Present Illness  10/08/2013 1st [facility] office visit/ Alicia [name]   Chief Complaint  Patient presents with  . Pulmonary Consult    Referred by Dr Christene SlatesKarar Hussain for eval of abn cxr. Pt c/o night sweats for the past few months. She denies any respiratory co's today.   was coughing per report but now pt denies Post nasal Drainage sensation daytime only  x 3-4 m s itching sneezing ant rhinorrhea Not limited by breathing from desired activities  But very sedentary Does not  have overt HB rec Please see patient coordinator before you leave today  to schedule sinus CT  No further chest xrays recommended Late add Based on neg ct sinus rec For drainage take chlortrimeton (chlorpheniramine) 4 mg every 4 hours available over the counter (may cause drowsiness)       11/05/2013 f/u ov/Alicia [name] re: pf/ uacs  Chief Complaint  Patient presents with  . Follow-up    Pt states that she is doing well and denies any new co's today.   Not limited by breathing from desired activities   Still has drainge / nasal drainage clear and nasal congestion complaints day > noct s flare in am or excess mucus and sinus ct neg/ no excess or purulent secretions No better on h1 to date but present x years rec Chlortrimeton 4 mg every 4 hours as needed  Add Pantoprazole (protonix) 40 mg   Take 30-60 min before first meal of the day and Pepcid 20 mg one bedtime until return to office - this is the best way to tell whether stomach acid is contributing to your problem.   GERD diet   12/17/2013 f/u ov/Alicia [name] re: PF p legionaire's / pnds  Chief Complaint  Patient  presents with  . Follow-up    Pt states she is doing well and denies any new co's today.   Not limited by breathing from desired activities   Has not tried 1st gen h1 as rec for pnds / no noct or early am flares of cough or excess drainage disturbing sleep No inhalers at all  rec Continue  Pantoprazole (protonix) 40 mg   Take 30-60 min before first meal of the day and Pepcid 20 mg one bedtime until return to office - this is the best way to tell whether stomach acid is contributing to your problem.   GERD (REFLUX)   For drainage take chlortrimeton (chlorpheniramine) 4 mg every 4 hours available over the counter (may cause drowsiness) -would recommend ENT evaluation if hoarsenees not better     04/15/2014 f/u ov/Alicia [name] re: chronic rhinitis/ new bilateral ankle edema  Chief Complaint  Patient presents with  . Follow-up    allergy issues; no c/o SOB or cough; mucus due to allergies   breathing fine except nasal congestion/ obst sensation esp  on L  Ran out of meds end of February  2016 and swelling worse since  Not limited by breathing from desired activities       No obvious day to day or daytime variabilty or assoc excess or purulent secretions  cp or chest tightness, subjective wheeze or overt  hb symptoms. No unusual exp hx or h/o childhood pna/ asthma or knowledge of premature birth.  Sleeping ok without nocturnal  or early am exacerbation  of respiratory  c/o's or need for noct saba. Also denies any obvious fluctuation of symptoms with weather or environmental changes or other aggravating or alleviating factors except as outlined above   Current Medications, Allergies, Complete Past Medical History, Past Surgical History, Family History, and Social History were reviewed in [facility] record.  ROS  The following are not active complaints unless bolded sore throat, dysphagia, dental problems, itching, sneezing,  nasal congestion x 7 d or excess/ purulent  secretions, ear ache,   fever, chills, sweats, unintended wt loss, pleuritic or exertional cp, hemoptysis,  orthopnea pnd or leg swelling, presyncope, palpitations, heartburn, abdominal pain, anorexia, nausea, vomiting, diarrhea  or change in bowel or urinary habits, change in stools or urine, dysuria,hematuria,  rash, arthralgias, visual complaints, headache, numbness weakness or ataxia or problems with walking or coordination,  change in mood/affect or memory.                   Objective:   Physical Exam  Mod  hoarse amb wf nad  04/15/2014        136  Wt Readings from Last 3 Encounters:  12/17/13 139 lb (63.05 kg)  11/05/13 139 lb (63.05 kg)  10/08/13 140 lb (63.504 kg)    Vital signs reviewed     HEENT: edentulous, turbinates, and orophanx. Nl external ear canals without cough reflex   NECK :  without JVD/Nodes/TM/ nl carotid upstrokes bilaterally   LUNGS: no acc muscle use, clear to A and P bilaterally without cough on insp or exp maneuvers   CV:  RRR  no s3 or murmur or increase in P2,   Trace to 1 + pitting bilateral pitting lower ext   ABD:  soft and nontender with nl excursion in the supine position. No bruits or organomegaly, bowel sounds nl  MS:  warm without deformities, calf tenderness, cyanosis or clubbing  SKIN: warm and dry without lesions           Labs ordered/ reviewed     Recent Labs Lab 04/15/14 1456  NA 136  K 4.4  CL 104  CO2 29  BUN 25*  CREATININE 1.39*  GLUCOSE 93    Recent Labs Lab 04/15/14 1456  HGB 11.1*  HCT 34.1*  WBC 7.0  PLT 252.0     Lab Results  Component Value Date   TSH 3.16 04/15/2014     Lab Results  Component Value Date   PROBNP 131.0* 04/15/2014            Assessment & Plan:

## 2014-04-15 NOTE — Patient Instructions (Signed)
Continue your medications as before, we refilled all of them   Please remember to go to the lab   department downstairs for your tests - we will call you with the results when they are available.  Please schedule a follow up office visit in 6 weeks, call sooner if needed with all medications in hand to see Tammy NP for recheck

## 2014-04-21 ENCOUNTER — Encounter: Payer: Self-pay | Admitting: Internal Medicine

## 2014-04-21 DIAGNOSIS — R609 Edema, unspecified: Secondary | ICD-10-CM | POA: Insufficient documentation

## 2014-04-21 NOTE — Assessment & Plan Note (Signed)
Sym, no evidence of chf > continue prn lasix/K as before (refilled)

## 2014-04-21 NOTE — Assessment & Plan Note (Signed)
-   Sinus CT  10/09/13> No evidence of sinusitis. - 11/06/2013 started ppi/h2 and h1 at hs  - allergy profile 12/17/2013 > Eos 0 > IgE  38 with neg RAST   rx 1st gen h1 if tolerated, if not try zyrtec

## 2014-04-21 NOTE — Assessment & Plan Note (Signed)
-   10/08/2013  Walked @ slow pace  RA x 1 lap @ 185 ft each stopped due to  desat to 84% s sob  - 11/06/2013  Walked RA slow pace  2 laps @ 185 ft each stopped due to sat 85%  NO SOB  - 12/17/2013  Walked RA  2 laps @ 185 ft each stopped due to mild Sob but not desats/slow  pace   No evidence of clinical progression while on gerd rx chronically based on Use of PPI is associated with improved survival time and with decreased radiologic fibrosis per King's study published in AJRCCM vol 184 p1390.  Dec 2011  This may not be cause and effect, but given how universally unimpressive and expensive  all the otherstudy drugs have been for pf,   rec continue    rx ppi / diet/ lifestyle modification.

## 2014-05-28 ENCOUNTER — Ambulatory Visit: Payer: Medicare Other | Admitting: Adult Health

## 2014-07-26 DIAGNOSIS — I5181 Takotsubo syndrome: Secondary | ICD-10-CM

## 2014-07-26 HISTORY — DX: Takotsubo syndrome: I51.81

## 2014-08-15 ENCOUNTER — Emergency Department (HOSPITAL_COMMUNITY): Payer: Medicare Other

## 2014-08-15 ENCOUNTER — Encounter (HOSPITAL_COMMUNITY): Payer: Self-pay | Admitting: Emergency Medicine

## 2014-08-15 ENCOUNTER — Inpatient Hospital Stay (HOSPITAL_COMMUNITY)
Admission: EM | Admit: 2014-08-15 | Discharge: 2014-08-18 | DRG: 091 | Disposition: A | Discharge status: Home / Self Care | Payer: Medicare Other | Attending: Internal Medicine | Admitting: Internal Medicine

## 2014-08-15 DIAGNOSIS — N289 Disorder of kidney and ureter, unspecified: Secondary | ICD-10-CM | POA: Diagnosis not present

## 2014-08-15 DIAGNOSIS — D649 Anemia, unspecified: Secondary | ICD-10-CM | POA: Diagnosis not present

## 2014-08-15 DIAGNOSIS — D638 Anemia in other chronic diseases classified elsewhere: Secondary | ICD-10-CM | POA: Diagnosis present

## 2014-08-15 DIAGNOSIS — I2510 Atherosclerotic heart disease of native coronary artery without angina pectoris: Secondary | ICD-10-CM | POA: Diagnosis not present

## 2014-08-15 DIAGNOSIS — Z79899 Other long term (current) drug therapy: Secondary | ICD-10-CM | POA: Diagnosis not present

## 2014-08-15 DIAGNOSIS — G959 Disease of spinal cord, unspecified: Secondary | ICD-10-CM | POA: Diagnosis not present

## 2014-08-15 DIAGNOSIS — R531 Weakness: Secondary | ICD-10-CM | POA: Diagnosis present

## 2014-08-15 DIAGNOSIS — I10 Essential (primary) hypertension: Secondary | ICD-10-CM | POA: Diagnosis not present

## 2014-08-15 DIAGNOSIS — I214 Non-ST elevation (NSTEMI) myocardial infarction: Secondary | ICD-10-CM | POA: Diagnosis present

## 2014-08-15 DIAGNOSIS — I5181 Takotsubo syndrome: Secondary | ICD-10-CM | POA: Diagnosis present

## 2014-08-15 DIAGNOSIS — R29898 Other symptoms and signs involving the musculoskeletal system: Secondary | ICD-10-CM | POA: Diagnosis present

## 2014-08-15 DIAGNOSIS — N183 Chronic kidney disease, stage 3 (moderate): Secondary | ICD-10-CM | POA: Diagnosis not present

## 2014-08-15 DIAGNOSIS — M4802 Spinal stenosis, cervical region: Secondary | ICD-10-CM | POA: Diagnosis present

## 2014-08-15 DIAGNOSIS — E785 Hyperlipidemia, unspecified: Secondary | ICD-10-CM | POA: Diagnosis present

## 2014-08-15 DIAGNOSIS — G9520 Unspecified cord compression: Principal | ICD-10-CM | POA: Insufficient documentation

## 2014-08-15 DIAGNOSIS — I129 Hypertensive chronic kidney disease with stage 1 through stage 4 chronic kidney disease, or unspecified chronic kidney disease: Secondary | ICD-10-CM | POA: Diagnosis present

## 2014-08-15 DIAGNOSIS — M6289 Other specified disorders of muscle: Secondary | ICD-10-CM | POA: Diagnosis not present

## 2014-08-15 DIAGNOSIS — J8410 Pulmonary fibrosis, unspecified: Secondary | ICD-10-CM | POA: Diagnosis present

## 2014-08-15 DIAGNOSIS — F17210 Nicotine dependence, cigarettes, uncomplicated: Secondary | ICD-10-CM | POA: Diagnosis present

## 2014-08-15 DIAGNOSIS — I272 Other secondary pulmonary hypertension: Secondary | ICD-10-CM | POA: Diagnosis present

## 2014-08-15 DIAGNOSIS — G459 Transient cerebral ischemic attack, unspecified: Secondary | ICD-10-CM

## 2014-08-15 DIAGNOSIS — N189 Chronic kidney disease, unspecified: Secondary | ICD-10-CM | POA: Insufficient documentation

## 2014-08-15 DIAGNOSIS — N179 Acute kidney failure, unspecified: Secondary | ICD-10-CM | POA: Diagnosis present

## 2014-08-15 HISTORY — DX: Cough: R05

## 2014-08-15 HISTORY — DX: Localized edema: R60.0

## 2014-08-15 HISTORY — DX: Other specified cough: R05.8

## 2014-08-15 HISTORY — DX: Edema, unspecified: R60.9

## 2014-08-15 HISTORY — DX: Pulmonary fibrosis, unspecified: J84.10

## 2014-08-15 HISTORY — DX: Unspecified osteoarthritis, unspecified site: M19.90

## 2014-08-15 HISTORY — DX: Essential (primary) hypertension: I10

## 2014-08-15 HISTORY — DX: Other constipation: K59.09

## 2014-08-15 LAB — I-STAT CHEM 8, ED
BUN: 27 mg/dL — ABNORMAL HIGH (ref 6–20)
CALCIUM ION: 1.27 mmol/L (ref 1.13–1.30)
Chloride: 97 mmol/L — ABNORMAL LOW (ref 101–111)
Creatinine, Ser: 1.4 mg/dL — ABNORMAL HIGH (ref 0.44–1.00)
Glucose, Bld: 82 mg/dL (ref 65–99)
HCT: 35 % — ABNORMAL LOW (ref 36.0–46.0)
HEMOGLOBIN: 11.9 g/dL — AB (ref 12.0–15.0)
Potassium: 5 mmol/L (ref 3.5–5.1)
SODIUM: 132 mmol/L — AB (ref 135–145)
TCO2: 24 mmol/L (ref 0–100)

## 2014-08-15 LAB — COMPREHENSIVE METABOLIC PANEL
ALT: 11 U/L — ABNORMAL LOW (ref 14–54)
ANION GAP: 5 (ref 5–15)
AST: 32 U/L (ref 15–41)
Albumin: 3.9 g/dL (ref 3.5–5.0)
Alkaline Phosphatase: 65 U/L (ref 38–126)
BUN: 28 mg/dL — AB (ref 6–20)
CO2: 28 mmol/L (ref 22–32)
Calcium: 9.5 mg/dL (ref 8.9–10.3)
Chloride: 98 mmol/L — ABNORMAL LOW (ref 101–111)
Creatinine, Ser: 1.45 mg/dL — ABNORMAL HIGH (ref 0.44–1.00)
GFR, EST AFRICAN AMERICAN: 36 mL/min — AB (ref >60)
GFR, EST NON AFRICAN AMERICAN: 31 mL/min — AB (ref >60)
GLUCOSE: 90 mg/dL (ref 65–99)
POTASSIUM: 6.2 mmol/L — AB (ref 3.5–5.1)
SODIUM: 131 mmol/L — AB (ref 135–145)
Total Bilirubin: 1.5 mg/dL — ABNORMAL HIGH (ref 0.3–1.2)
Total Protein: 7.6 g/dL (ref 6.5–8.1)

## 2014-08-15 LAB — CBC
HEMATOCRIT: 32.8 % — AB (ref 36.0–46.0)
Hemoglobin: 10.7 g/dL — ABNORMAL LOW (ref 12.0–15.0)
MCH: 28.5 pg (ref 26.0–34.0)
MCHC: 32.6 g/dL (ref 30.0–36.0)
MCV: 87.2 fL (ref 78.0–100.0)
Platelets: 141 10*3/uL — ABNORMAL LOW (ref 150–400)
RBC: 3.76 MIL/uL — AB (ref 3.87–5.11)
RDW: 13.7 % (ref 11.5–15.5)
WBC: 5.3 10*3/uL (ref 4.0–10.5)

## 2014-08-15 LAB — I-STAT TROPONIN, ED: Troponin i, poc: 0.01 ng/mL (ref 0.00–0.08)

## 2014-08-15 LAB — URINALYSIS, ROUTINE W REFLEX MICROSCOPIC
Bilirubin Urine: NEGATIVE
Glucose, UA: NEGATIVE mg/dL
Hgb urine dipstick: NEGATIVE
Ketones, ur: NEGATIVE mg/dL
Leukocytes, UA: NEGATIVE
Nitrite: NEGATIVE
PH: 6 (ref 5.0–8.0)
Protein, ur: NEGATIVE mg/dL
SPECIFIC GRAVITY, URINE: 1.004 — AB (ref 1.005–1.030)
UROBILINOGEN UA: 0.2 mg/dL (ref 0.0–1.0)

## 2014-08-15 LAB — DIFFERENTIAL
BASOS PCT: 0 % (ref 0–1)
Basophils Absolute: 0 10*3/uL (ref 0.0–0.1)
EOS ABS: 0.1 10*3/uL (ref 0.0–0.7)
Eosinophils Relative: 2 % (ref 0–5)
LYMPHS PCT: 27 % (ref 12–46)
Lymphs Abs: 1.4 10*3/uL (ref 0.7–4.0)
MONO ABS: 0.4 10*3/uL (ref 0.1–1.0)
MONOS PCT: 7 % (ref 3–12)
Neutro Abs: 3.4 10*3/uL (ref 1.7–7.7)
Neutrophils Relative %: 64 % (ref 43–77)

## 2014-08-15 LAB — RAPID URINE DRUG SCREEN, HOSP PERFORMED
Amphetamines: NOT DETECTED
BARBITURATES: NOT DETECTED
Benzodiazepines: NOT DETECTED
Cocaine: NOT DETECTED
OPIATES: NOT DETECTED
Tetrahydrocannabinol: NOT DETECTED

## 2014-08-15 LAB — PROTIME-INR
INR: 0.94 (ref 0.00–1.49)
Prothrombin Time: 12.8 seconds (ref 11.6–15.2)

## 2014-08-15 LAB — APTT: aPTT: 20 seconds — ABNORMAL LOW (ref 24–37)

## 2014-08-15 LAB — POTASSIUM: POTASSIUM: 5.1 mmol/L (ref 3.5–5.1)

## 2014-08-15 LAB — ETHANOL: Alcohol, Ethyl (B): <5 mg/dL (ref <5)

## 2014-08-15 MED ORDER — HEPARIN SODIUM (PORCINE) 5000 UNIT/ML IJ SOLN: 5000.0000 [IU] | Freq: Three times a day (TID) | INTRAMUSCULAR | Status: DC

## 2014-08-15 MED ORDER — SODIUM CHLORIDE 0.9 % IV SOLN
INTRAVENOUS | Status: DC
  Administered 2014-08-16: 01:00:00 via INTRAVENOUS

## 2014-08-15 MED ORDER — VITAMIN B 12 100 MCG PO LOZG: LOZENGE | Freq: Every day | ORAL | Status: DC

## 2014-08-15 MED ORDER — IRON PO TABS: 1.0000 | ORAL_TABLET | Freq: Every day | ORAL | Status: DC

## 2014-08-15 MED ORDER — STROKE: EARLY STAGES OF RECOVERY BOOK
Freq: Once | Status: AC
  Administered 2014-08-16: 01:00:00

## 2014-08-15 NOTE — ED Notes (Signed)
Pt states that about two hours ago she started having right arm cramps and right sided headache.  Pt states that she has RA.  Pt grips equal and strong, no facial deficits.

## 2014-08-15 NOTE — ED Notes (Signed)
MD at bedside. 

## 2014-08-15 NOTE — ED Provider Notes (Signed)
CSN: [id number]     Arrival date & time 08/15/14  1648 History   First MD Initiated Contact with Patient 08/15/14 1739     Chief Complaint  Patient presents with  . Headache  . Extremity Weakness      HPI Pt was seen at 1745. Per pt, c/o gradual onset and resolution of one episode of headache and extremity weakness that occurred today between 1500/1600. Pt states she developed a right sided "headache" then RUE "weakness." Pt states she "couldn't move my hand or fingers." Pt's symptoms lasted approximately 30 minutes before resolving. Pt states she "feels fine now." Denies CP/palpitations, no SOB/cough, no abd pain, no N/V/D, no neck or back pain, no visual changes, no other focal motor weakness, no tingling/numbness in extremities, no ataxia, no slurred speech, no facial droop.     Past Medical History  Diagnosis Date  . H/O Legionnaire's disease   . Pneumonia   . Anemia   . CKD (chronic kidney disease)   . Hyperlipidemia   . Arthritis   . Postinflammatory pulmonary fibrosis   . Upper airway cough syndrome   . Peripheral edema   . Chronic constipation   . Hypertension    Past Surgical History  Procedure Laterality Date  . Cholecystectomy    . Abdominal hysterectomy     Family History  Problem Relation Age of Onset  . Lung cancer Brother     never smoker   History  Substance Use Topics  . Smoking status: Former Smoker -- 0.50 packs/day for 36 years    Types: Cigarettes    Quit date: 01/25/1977  . Smokeless tobacco: Never Used  . Alcohol Use: No    Review of Systems ROS: Statement: All systems negative except as marked or noted in the HPI; Constitutional: Negative for fever and chills. ; ; Eyes: Negative for eye pain, redness and discharge. ; ; ENMT: Negative for ear pain, hoarseness, nasal congestion, sinus pressure and sore throat. ; ; Cardiovascular: Negative for chest pain, palpitations, diaphoresis, dyspnea and peripheral edema. ; ; Respiratory: Negative for cough,  wheezing and stridor. ; ; Gastrointestinal: Negative for nausea, vomiting, diarrhea, abdominal pain, blood in stool, hematemesis, jaundice and rectal bleeding. . ; ; Genitourinary: Negative for dysuria, flank pain and hematuria. ; ; Musculoskeletal: Negative for back pain and neck pain. Negative for swelling and trauma.; ; Skin: Negative for pruritus, rash, abrasions, blisters, bruising and skin lesion.; ; Neuro: +headache, extremity weakness. Negative for lightheadedness and neck stiffness. Negative for weakness, altered level of consciousness , altered mental status, paresthesias, involuntary movement, seizure and syncope.      Allergies  Asa; Tylenol 8 hour; and Penicillins  Home Medications   Prior to Admission medications   Medication Sig Start Date End Date Taking? Authorizing Provider  Cyanocobalamin (VITAMIN B 12 PO) Take 1 tablet by mouth daily.   Yes Historical Provider, MD  folic acid (FOLVITE) 1 MG tablet Take 1 mg by mouth daily.   Yes Historical Provider, MD  furosemide (LASIX) 20 MG tablet Take 20 mg by mouth every other day.    Yes Historical Provider, MD  Iron TABS Take 1 tablet by mouth daily.   Yes Historical Provider, MD  Multiple Vitamin (MULTIVITAMIN WITH MINERALS) TABS tablet Take 1 tablet by mouth daily.   Yes Historical Provider, MD  potassium chloride (K-DUR) 10 MEQ tablet Take 10 mEq by mouth every other day.    Yes Historical Provider, MD  famotidine (PEPCID) 20 MG tablet Take  1 tablet (20 mg total) by mouth at bedtime. Patient not taking: Reported on 08/15/2014 04/15/14   Nyoka Cowden, MD  pantoprazole (PROTONIX) 40 MG tablet Take 1 tablet (40 mg total) by mouth daily. Take 30-60 min before first meal of the day Patient not taking: Reported on 08/15/2014 04/15/14   Nyoka Cowden, MD   BP 169/64 mmHg  Pulse 65  Temp(Src) 98.4 F (36.9 C) (Oral)  Resp 18  SpO2 98% Physical Exam  1750: Physical examination:  Nursing notes reviewed; Vital signs and O2 SAT  reviewed;  Constitutional: Well developed, Well nourished, Well hydrated, In no acute distress; Head:  Normocephalic, atraumatic. NT over bilat temporal arteries.; Eyes: EOMI, PERRL, No scleral icterus; ENMT: Mouth and pharynx normal, Mucous membranes moist; Neck: Supple, Full range of motion, No lymphadenopathy; Cardiovascular: Regular rate and rhythm, No murmur, rub, or gallop; Respiratory: Breath sounds clear & equal bilaterally, No rales, rhonchi, wheezes.  Speaking full sentences with ease, Normal respiratory effort/excursion; Chest: Nontender, Movement normal; Abdomen: Soft, Nontender, Nondistended, Normal bowel sounds; Genitourinary: No CVA tenderness; Spine:  No midline CS, TS, LS tenderness.;; Extremities: Pulses normal, No tenderness, No edema, No calf edema or asymmetry.; Neuro: AA&Ox3, Major CN grossly intact. Speech clear.  No facial droop.  No nystagmus. Grips equal. Strength 5/5 equal bilat UE's and LE's.  DTR 2/4 equal bilat UE's and LE's.  No gross sensory deficits.  Normal cerebellar testing bilat UE's (finger-nose) and LE's (heel-shin)..; Skin: Color normal, Warm, Dry.   ED Course  Procedures      EKG Interpretation   Date/Time:  Thursday August 15 2014 18:40:04 EDT Ventricular Rate:  83 PR Interval:  210 QRS Duration: 91 QT Interval:  389 QTC Calculation: 457 R Axis:   -27 Text Interpretation:  Sinus rhythm Borderline left axis deviation When  compared with ECG of 06/12/2005 No significant change was found Confirmed  by [facility]  MD, Nicholos Johns [phone]) on 08/15/2014 7:00:42 PM      MDM  MDM Reviewed: previous chart, nursing note and vitals Reviewed previous: labs and ECG Interpretation: labs, ECG and CT scan      Results for orders placed or performed during the hospital encounter of 08/15/14  Ethanol  Result Value Ref Range   Alcohol, Ethyl (B) <5 <5 mg/dL  Protime-INR  Result Value Ref Range   Prothrombin Time 12.8 11.6 - 15.2 seconds   INR 0.94 0.00 - 1.49   APTT  Result Value Ref Range   aPTT 20 (L) 24 - 37 seconds  CBC  Result Value Ref Range   WBC 5.3 4.0 - 10.5 K/uL   RBC 3.76 (L) 3.87 - 5.11 MIL/uL   Hemoglobin 10.7 (L) 12.0 - 15.0 g/dL   HCT 60.4 (L) 54.0 - 98.1 %   MCV 87.2 78.0 - 100.0 fL   MCH 28.5 26.0 - 34.0 pg   MCHC 32.6 30.0 - 36.0 g/dL   RDW 19.1 47.8 - 29.5 %   Platelets 141 (L) 150 - 400 K/uL  Differential  Result Value Ref Range   Neutrophils Relative % 64 43 - 77 %   Neutro Abs 3.4 1.7 - 7.7 K/uL   Lymphocytes Relative 27 12 - 46 %   Lymphs Abs 1.4 0.7 - 4.0 K/uL   Monocytes Relative 7 3 - 12 %   Monocytes Absolute 0.4 0.1 - 1.0 K/uL   Eosinophils Relative 2 0 - 5 %   Eosinophils Absolute 0.1 0.0 - 0.7 K/uL   Basophils  Relative 0 0 - 1 %   Basophils Absolute 0.0 0.0 - 0.1 K/uL  Comprehensive metabolic panel  Result Value Ref Range   Sodium 131 (L) 135 - 145 mmol/L   Potassium 6.2 (HH) 3.5 - 5.1 mmol/L   Chloride 98 (L) 101 - 111 mmol/L   CO2 28 22 - 32 mmol/L   Glucose, Bld 90 65 - 99 mg/dL   BUN 28 (H) 6 - 20 mg/dL   Creatinine, Ser 4.13 (H) 0.44 - 1.00 mg/dL   Calcium 9.5 8.9 - 24.4 mg/dL   Total Protein 7.6 6.5 - 8.1 g/dL   Albumin 3.9 3.5 - 5.0 g/dL   AST 32 15 - 41 U/L   ALT 11 (L) 14 - 54 U/L   Alkaline Phosphatase 65 38 - 126 U/L   Total Bilirubin 1.5 (H) 0.3 - 1.2 mg/dL   GFR calc non Af Amer 31 (L) >60 mL/min   GFR calc Af Amer 36 (L) >60 mL/min   Anion gap 5 5 - 15  Urine rapid drug screen (hosp performed)not at [facility]  Result Value Ref Range   Opiates NONE DETECTED NONE DETECTED   Cocaine NONE DETECTED NONE DETECTED   Benzodiazepines NONE DETECTED NONE DETECTED   Amphetamines NONE DETECTED NONE DETECTED   Tetrahydrocannabinol NONE DETECTED NONE DETECTED   Barbiturates NONE DETECTED NONE DETECTED  Urinalysis, Routine w reflex microscopic (not at [facility])  Result Value Ref Range   Color, Urine YELLOW YELLOW   APPearance CLEAR CLEAR   Specific Gravity, Urine 1.004 (L) 1.005 - 1.030   pH  6.0 5.0 - 8.0   Glucose, UA NEGATIVE NEGATIVE mg/dL   Hgb urine dipstick NEGATIVE NEGATIVE   Bilirubin Urine NEGATIVE NEGATIVE   Ketones, ur NEGATIVE NEGATIVE mg/dL   Protein, ur NEGATIVE NEGATIVE mg/dL   Urobilinogen, UA 0.2 0.0 - 1.0 mg/dL   Nitrite NEGATIVE NEGATIVE   Leukocytes, UA NEGATIVE NEGATIVE  I-Stat Chem 8, ED  (not at [facility])  Result Value Ref Range   Sodium 132 (L) 135 - 145 mmol/L   Potassium 5.0 3.5 - 5.1 mmol/L   Chloride 97 (L) 101 - 111 mmol/L   BUN 27 (H) 6 - 20 mg/dL   Creatinine, Ser 0.10 (H) 0.44 - 1.00 mg/dL   Glucose, Bld 82 65 - 99 mg/dL   Calcium, Ion 2.72 5.36 - 1.30 mmol/L   TCO2 24 0 - 100 mmol/L   Hemoglobin 11.9 (L) 12.0 - 15.0 g/dL   HCT 64.4 (L) 03.4 - 74.2 %  I-stat troponin, ED (not at [facility], [facility])  Result Value Ref Range   Troponin i, poc 0.01 0.00 - 0.08 ng/mL   Comment 3           Ct Head Wo Contrast 08/15/2014   CLINICAL DATA:  81 year old female with right hand weakness  EXAM: CT HEAD WITHOUT CONTRAST  TECHNIQUE: Contiguous axial images were obtained from the base of the skull through the vertex without intravenous contrast.  COMPARISON:  None.  FINDINGS: The ventricles are dilated and the sulci are prominent compatible with age-related atrophy. Periventricular and deep white matter hypodensities represent chronic microvascular ischemic changes. There is no intracranial hemorrhage. No mass effect or midline shift identified.  The visualized paranasal sinuses and mastoid air cells are well aerated. The calvarium is intact.  IMPRESSION: No acute intracranial pathology.  Age-related atrophy and chronic microvascular ischemic disease.  If symptoms persist and there are no contraindications, MRI may provide better evaluation if clinically  indicated.   Electronically Signed   By: Elgie Collard M.D.   On: 08/15/2014 19:04    2000:  BUN/Cr per baseline. Initial potassium hemolyzed; re-check was normal. MRI brain pending. Dx and testing d/w pt and  family.  Questions answered.  Verb understanding, agreeable to observation admit.  T/C to [facility] Dr. Welton Flakes, case discussed, including:  HPI, pertinent PM/SHx, VS/PE, dx testing, ED course and treatment:  Agreeable to admit, requests he will come to the ED for evaluation.    Samuel Jester, DO 08/18/14 [phone]

## 2014-08-15 NOTE — ED Notes (Signed)
Pt not in room at this time....delay in lab draw

## 2014-08-15 NOTE — Progress Notes (Signed)
Pt arrived to unit via stretcher by [facility]. Pt alert and oriented to staff, unit and plan of care.  Pt complaining of chest pain and pain on inhalation. MD notified and stat EKG obtained. Awaiting new orders. Tele applied and central monitoring notified. Will continue to monitor.

## 2014-08-15 NOTE — H&P (Signed)
[facility] History and Physical  Alicia [name]:[id number] DOB: 06/04/1922 DOA: 08/15/2014  Referring physician: Samuel Jester, DO PCP: [name], MD   Chief Complaint: Weakness  HPI: Alicia [name] is a 81 y.o. female with history of pulmonary fibrosis anemia hyperlipidemia HTN presents with weakness. Patient states that she was watching television and noted that her right arm went numb. She states it went down to her hand also. She states that her hand went into a spasm. Associated with this she had a pain in her side of her head. She states that everything has almost completely resolved. Patient has no headache noted. She has no dizziness. She did not have any syncope. She had no chest pain. She has no weakness in her legs. She states that she has general arthritis so it is hard for her to get around. She has no SOB noted no cough or congestion.   Review of Systems:  Systems reviewed and is unremarkable other than HPI  Past Medical History  Diagnosis Date  . H/O Legionnaire's disease   . Pneumonia   . Anemia   . CKD (chronic kidney disease)   . Hyperlipidemia   . Arthritis   . Postinflammatory pulmonary fibrosis   . Upper airway cough syndrome   . Peripheral edema   . Chronic constipation   . Hypertension    Past Surgical History  Procedure Laterality Date  . Cholecystectomy    . Abdominal hysterectomy     Social History:  reports that she quit smoking about 37 years ago. Her smoking use included Cigarettes. She has a 18 pack-year smoking history. She has never used smokeless tobacco. She reports that she does not drink alcohol or use illicit drugs.  Allergies  Allergen Reactions  . Asa [Aspirin]     GI Upset  . Tylenol 8 Hour [Acetaminophen]     GI Upset  . Penicillins Rash    Family History  Problem Relation Age of Onset  . Lung cancer Brother     never smoker     Prior to Admission medications   Medication Sig Start Date End Date Taking?  Authorizing Provider  Cyanocobalamin (VITAMIN B 12 PO) Take 1 tablet by mouth daily.   Yes Historical Provider, MD  folic acid (FOLVITE) 1 MG tablet Take 1 mg by mouth daily.   Yes Historical Provider, MD  furosemide (LASIX) 20 MG tablet Take 20 mg by mouth every other day.    Yes Historical Provider, MD  Iron TABS Take 1 tablet by mouth daily.   Yes Historical Provider, MD  Multiple Vitamin (MULTIVITAMIN WITH MINERALS) TABS tablet Take 1 tablet by mouth daily.   Yes Historical Provider, MD  potassium chloride (K-DUR) 10 MEQ tablet Take 10 mEq by mouth every other day.    Yes Historical Provider, MD  famotidine (PEPCID) 20 MG tablet Take 1 tablet (20 mg total) by mouth at bedtime. Patient not taking: Reported on 08/15/2014 04/15/14   Nyoka Cowden, MD  pantoprazole (PROTONIX) 40 MG tablet Take 1 tablet (40 mg total) by mouth daily. Take 30-60 min before first meal of the day Patient not taking: Reported on 08/15/2014 04/15/14   Nyoka Cowden, MD   Physical Exam: Filed Vitals:   08/15/14 1701 08/15/14 1845 08/15/14 1932 08/15/14 1936  BP: 169/64   197/77  Pulse: 65   74  Temp: 98.4 F (36.9 C)  98.2 F (36.8 C)   TempSrc: Oral     Resp: 18 19  14  SpO2: 98%   99%    Wt Readings from Last 3 Encounters:  04/15/14 61.689 kg (136 lb)  12/17/13 63.05 kg (139 lb)  11/05/13 63.05 kg (139 lb)    General:  Appears calm and comfortable Eyes: PERRL, normal lids, irises & conjunctiva ENT: grossly normal hearing, lips & tongue Neck: no LAD, masses or thyromegaly Cardiovascular: RR, no m/r/g. +LE edema. Respiratory: CTA bilaterally, no w/r/r. Normal respiratory effort. Abdomen: soft, ntnd Skin: no rash or induration seen on limited exam Musculoskeletal: grossly normal tone BUE/BLE Psychiatric: grossly normal mood and affect Neurologic: grossly non-focal gait not checked          Labs on Admission:  Basic Metabolic Panel:  Recent Labs Lab 08/15/14 1807 08/15/14 1857  NA 131* 132*    K 6.2* 5.0  CL 98* 97*  CO2 28  --   GLUCOSE 90 82  BUN 28* 27*  CREATININE 1.45* 1.40*  CALCIUM 9.5  --    Liver Function Tests:  Recent Labs Lab 08/15/14 1807  AST 32  ALT 11*  ALKPHOS 65  BILITOT 1.5*  PROT 7.6  ALBUMIN 3.9   No results for input(s): LIPASE, AMYLASE in the last 168 hours. No results for input(s): AMMONIA in the last 168 hours. CBC:  Recent Labs Lab 08/15/14 1807 08/15/14 1857  WBC 5.3  --   NEUTROABS 3.4  --   HGB 10.7* 11.9*  HCT 32.8* 35.0*  MCV 87.2  --   PLT 141*  --    Cardiac Enzymes: No results for input(s): CKTOTAL, CKMB, CKMBINDEX, TROPONINI in the last 168 hours.  BNP (last 3 results) No results for input(s): BNP in the last 8760 hours.  ProBNP (last 3 results)  Recent Labs  04/15/14 1456  PROBNP 131.0*    CBG: No results for input(s): GLUCAP in the last 168 hours.  Radiological Exams on Admission: Ct Head Wo Contrast  08/15/2014   CLINICAL DATA:  81 year old female with right hand weakness  EXAM: CT HEAD WITHOUT CONTRAST  TECHNIQUE: Contiguous axial images were obtained from the base of the skull through the vertex without intravenous contrast.  COMPARISON:  None.  FINDINGS: The ventricles are dilated and the sulci are prominent compatible with age-related atrophy. Periventricular and deep white matter hypodensities represent chronic microvascular ischemic changes. There is no intracranial hemorrhage. No mass effect or midline shift identified.  The visualized paranasal sinuses and mastoid air cells are well aerated. The calvarium is intact.  IMPRESSION: No acute intracranial pathology.  Age-related atrophy and chronic microvascular ischemic disease.  If symptoms persist and there are no contraindications, MRI may provide better evaluation if clinically indicated.   Electronically Signed   By: Elgie Collard M.D.   On: 08/15/2014 19:04      Assessment/Plan Principal Problem:   TIA (transient ischemic attack) Active  Problems:   Postinflammatory pulmonary fibrosis   AKI (acute kidney injury)   Anemia   1. TIA -will admit to telemetry -will get MRI MRA -Neurochecks -will get carotid dopplers -will get echo -will check A1C -PT/OT ordered -allergic to Aspirin  2. CKD III -her creatinine appears to be at baseline compared to the last labs in the chart -will monitor labs  3. Anemia -will get iron studies -she will continue with iron folate and B12  4. Pulmonary Fibrosis -also likely has PAH to explain edema with right heart involvement -she will get an echo   Code Status: Full Code (must indicate code status--if unknown  or must be presumed, indicate so) DVT Prophylaxis:heparin Family Communication: none (indicate person spoken with, if applicable, with phone number if by telephone) Disposition Plan: home (indicate anticipated LOS)  Time spent:  [facility] Pager [phone]

## 2014-08-15 NOTE — ED Notes (Signed)
Critical K+ 6.2 and is grossly hemolyzed.  Lab suggests re-collect.  Brandi RN aware

## 2014-08-15 NOTE — ED Notes (Signed)
Nurse drawing labs. 

## 2014-08-16 ENCOUNTER — Inpatient Hospital Stay (HOSPITAL_COMMUNITY): Payer: Medicare Other

## 2014-08-16 ENCOUNTER — Encounter (HOSPITAL_COMMUNITY)
Admission: EM | Disposition: A | Discharge status: Home / Self Care | Payer: Medicare Other | Source: Home / Self Care | Attending: Internal Medicine

## 2014-08-16 DIAGNOSIS — I2510 Atherosclerotic heart disease of native coronary artery without angina pectoris: Secondary | ICD-10-CM

## 2014-08-16 DIAGNOSIS — N183 Chronic kidney disease, stage 3 (moderate): Secondary | ICD-10-CM

## 2014-08-16 DIAGNOSIS — N179 Acute kidney failure, unspecified: Secondary | ICD-10-CM

## 2014-08-16 DIAGNOSIS — R938 Abnormal findings on diagnostic imaging of other specified body structures: Secondary | ICD-10-CM

## 2014-08-16 DIAGNOSIS — M4802 Spinal stenosis, cervical region: Secondary | ICD-10-CM

## 2014-08-16 DIAGNOSIS — I10 Essential (primary) hypertension: Secondary | ICD-10-CM

## 2014-08-16 DIAGNOSIS — E785 Hyperlipidemia, unspecified: Secondary | ICD-10-CM

## 2014-08-16 DIAGNOSIS — G459 Transient cerebral ischemic attack, unspecified: Secondary | ICD-10-CM

## 2014-08-16 DIAGNOSIS — J8410 Pulmonary fibrosis, unspecified: Secondary | ICD-10-CM

## 2014-08-16 DIAGNOSIS — I214 Non-ST elevation (NSTEMI) myocardial infarction: Secondary | ICD-10-CM | POA: Diagnosis present

## 2014-08-16 DIAGNOSIS — D638 Anemia in other chronic diseases classified elsewhere: Secondary | ICD-10-CM

## 2014-08-16 HISTORY — PX: CARDIAC CATHETERIZATION: SHX172

## 2014-08-16 LAB — IRON AND TIBC
IRON: 26 ug/dL — AB (ref 28–170)
SATURATION RATIOS: 11 % (ref 10.4–31.8)
TIBC: 241 ug/dL — AB (ref 250–450)
UIBC: 215 ug/dL

## 2014-08-16 LAB — CBC WITH DIFFERENTIAL/PLATELET
Basophils Absolute: 0 10*3/uL (ref 0.0–0.1)
Basophils Relative: 0 % (ref 0–1)
EOS PCT: 1 % (ref 0–5)
Eosinophils Absolute: 0.1 10*3/uL (ref 0.0–0.7)
HCT: 29.4 % — ABNORMAL LOW (ref 36.0–46.0)
HEMOGLOBIN: 9.7 g/dL — AB (ref 12.0–15.0)
LYMPHS PCT: 13 % (ref 12–46)
Lymphs Abs: 0.8 10*3/uL (ref 0.7–4.0)
MCH: 28.2 pg (ref 26.0–34.0)
MCHC: 33 g/dL (ref 30.0–36.0)
MCV: 85.5 fL (ref 78.0–100.0)
MONO ABS: 0.3 10*3/uL (ref 0.1–1.0)
MONOS PCT: 5 % (ref 3–12)
Neutro Abs: 5.2 10*3/uL (ref 1.7–7.7)
Neutrophils Relative %: 81 % — ABNORMAL HIGH (ref 43–77)
PLATELETS: 226 10*3/uL (ref 150–400)
RBC: 3.44 MIL/uL — ABNORMAL LOW (ref 3.87–5.11)
RDW: 13.7 % (ref 11.5–15.5)
WBC: 6.4 10*3/uL (ref 4.0–10.5)

## 2014-08-16 LAB — CK TOTAL AND CKMB (NOT AT ARMC)
CK TOTAL: 49 U/L (ref 38–234)
CK, MB: 6.2 ng/mL — ABNORMAL HIGH (ref 0.5–5.0)
RELATIVE INDEX: INVALID (ref 0.0–2.5)

## 2014-08-16 LAB — COMPREHENSIVE METABOLIC PANEL
ALT: 7 U/L — ABNORMAL LOW (ref 14–54)
ANION GAP: 7 (ref 5–15)
AST: 19 U/L (ref 15–41)
Albumin: 2.9 g/dL — ABNORMAL LOW (ref 3.5–5.0)
Alkaline Phosphatase: 52 U/L (ref 38–126)
BUN: 21 mg/dL — ABNORMAL HIGH (ref 6–20)
CO2: 25 mmol/L (ref 22–32)
CREATININE: 1.29 mg/dL — AB (ref 0.44–1.00)
Calcium: 9.1 mg/dL (ref 8.9–10.3)
Chloride: 99 mmol/L — ABNORMAL LOW (ref 101–111)
GFR, EST AFRICAN AMERICAN: 41 mL/min — AB (ref >60)
GFR, EST NON AFRICAN AMERICAN: 36 mL/min — AB (ref >60)
Glucose, Bld: 123 mg/dL — ABNORMAL HIGH (ref 65–99)
Potassium: 4.2 mmol/L (ref 3.5–5.1)
Sodium: 131 mmol/L — ABNORMAL LOW (ref 135–145)
Total Bilirubin: 0.5 mg/dL (ref 0.3–1.2)
Total Protein: 6.3 g/dL — ABNORMAL LOW (ref 6.5–8.1)

## 2014-08-16 LAB — TROPONIN I
TROPONIN I: 0.69 ng/mL — AB (ref <0.031)
TROPONIN I: 1.01 ng/mL — AB (ref <0.031)
TROPONIN I: 0.91 ng/mL — AB (ref <0.031)

## 2014-08-16 LAB — FERRITIN: FERRITIN: 311 ng/mL — AB (ref 11–307)

## 2014-08-16 LAB — CREATININE, SERUM
CREATININE: 1.35 mg/dL — AB (ref 0.44–1.00)
GFR calc Af Amer: 39 mL/min — ABNORMAL LOW (ref >60)
GFR calc non Af Amer: 34 mL/min — ABNORMAL LOW (ref >60)

## 2014-08-16 LAB — HEPARIN LEVEL (UNFRACTIONATED): HEPARIN UNFRACTIONATED: 0.12 [IU]/mL — AB (ref 0.30–0.70)

## 2014-08-16 LAB — PROTIME-INR
INR: 1.2 (ref 0.00–1.49)
Prothrombin Time: 15.4 seconds — ABNORMAL HIGH (ref 11.6–15.2)

## 2014-08-16 LAB — VITAMIN B12: Vitamin B-12: 853 pg/mL (ref 180–914)

## 2014-08-16 SURGERY — LEFT HEART CATH AND CORONARY ANGIOGRAPHY

## 2014-08-16 MED ORDER — VERAPAMIL HCL 2.5 MG/ML IV SOLN
INTRAVENOUS | Status: AC
  Filled 2014-08-16: qty 2

## 2014-08-16 MED ORDER — GI COCKTAIL ~~LOC~~
30.0000 mL | Freq: Once | ORAL | Status: AC
  Administered 2014-08-16: 30 mL via ORAL
  Filled 2014-08-16: qty 30

## 2014-08-16 MED ORDER — SODIUM CHLORIDE 0.9 % IV SOLN
INTRAVENOUS | Status: AC
  Administered 2014-08-16: 19:00:00 via INTRAVENOUS

## 2014-08-16 MED ORDER — SODIUM CHLORIDE 0.9 % WEIGHT BASED INFUSION: 1.0000 mL/kg/h | INTRAVENOUS | Status: DC

## 2014-08-16 MED ORDER — HEPARIN SODIUM (PORCINE) 5000 UNIT/ML IJ SOLN
5000.0000 [IU] | Freq: Three times a day (TID) | INTRAMUSCULAR | Status: DC
  Administered 2014-08-16 – 2014-08-18 (×5): 5000 [IU] via SUBCUTANEOUS
  Filled 2014-08-16 (×6): qty 1

## 2014-08-16 MED ORDER — VITAMIN B-12 100 MCG PO TABS
50.0000 ug | ORAL_TABLET | Freq: Every day | ORAL | Status: DC
  Administered 2014-08-17 – 2014-08-18 (×2): 50 ug via ORAL
  Filled 2014-08-16 (×3): qty 1

## 2014-08-16 MED ORDER — NITROGLYCERIN 0.4 MG SL SUBL
0.4000 mg | SUBLINGUAL_TABLET | SUBLINGUAL | Status: DC | PRN
  Administered 2014-08-16: 0.4 mg via SUBLINGUAL
  Filled 2014-08-16: qty 1

## 2014-08-16 MED ORDER — ACETAMINOPHEN 325 MG PO TABS: 650.0000 mg | ORAL_TABLET | ORAL | Status: DC | PRN

## 2014-08-16 MED ORDER — HEPARIN BOLUS VIA INFUSION
1800.0000 [IU] | Freq: Once | INTRAVENOUS | Status: DC
  Filled 2014-08-16: qty 1800

## 2014-08-16 MED ORDER — SODIUM CHLORIDE 0.9 % IJ SOLN
INTRAMUSCULAR | Status: DC | PRN
  Administered 2014-08-16 (×2): via INTRA_ARTERIAL

## 2014-08-16 MED ORDER — SODIUM CHLORIDE 0.9 % IJ SOLN: 3.0000 mL | INTRAMUSCULAR | Status: DC | PRN

## 2014-08-16 MED ORDER — ONDANSETRON HCL 4 MG/2ML IJ SOLN: 4.0000 mg | Freq: Four times a day (QID) | INTRAMUSCULAR | Status: DC | PRN

## 2014-08-16 MED ORDER — FAMOTIDINE IN NACL 20-0.9 MG/50ML-% IV SOLN
20.0000 mg | Freq: Every day | INTRAVENOUS | Status: DC
  Administered 2014-08-16 – 2014-08-17 (×2): 20 mg via INTRAVENOUS
  Filled 2014-08-16 (×2): qty 50

## 2014-08-16 MED ORDER — HEPARIN (PORCINE) IN NACL 100-0.45 UNIT/ML-% IJ SOLN
900.0000 [IU]/h | INTRAMUSCULAR | Status: DC
  Administered 2014-08-16: 700 [IU]/h via INTRAVENOUS
  Filled 2014-08-16: qty 250

## 2014-08-16 MED ORDER — FERROUS SULFATE 325 (65 FE) MG PO TABS
325.0000 mg | ORAL_TABLET | Freq: Every day | ORAL | Status: DC
  Administered 2014-08-17 – 2014-08-18 (×2): 325 mg via ORAL
  Filled 2014-08-16 (×3): qty 1

## 2014-08-16 MED ORDER — LIDOCAINE HCL (PF) 1 % IJ SOLN
INTRAMUSCULAR | Status: AC
  Filled 2014-08-16: qty 30

## 2014-08-16 MED ORDER — LIDOCAINE HCL (PF) 1 % IJ SOLN
INTRAMUSCULAR | Status: DC | PRN
  Administered 2014-08-16: 8 mL via INTRADERMAL

## 2014-08-16 MED ORDER — ASPIRIN EC 325 MG PO TBEC
325.0000 mg | DELAYED_RELEASE_TABLET | Freq: Every day | ORAL | Status: DC
  Administered 2014-08-16 – 2014-08-18 (×3): 325 mg via ORAL
  Filled 2014-08-16 (×3): qty 1

## 2014-08-16 MED ORDER — SODIUM CHLORIDE 0.9 % IV SOLN: 250.0000 mL | INTRAVENOUS | Status: DC | PRN

## 2014-08-16 MED ORDER — ATORVASTATIN CALCIUM 80 MG PO TABS
80.0000 mg | ORAL_TABLET | Freq: Every day | ORAL | Status: DC
  Administered 2014-08-16 – 2014-08-18 (×4): 80 mg via ORAL
  Filled 2014-08-16 (×4): qty 1

## 2014-08-16 MED ORDER — METOPROLOL TARTRATE 25 MG PO TABS
25.0000 mg | ORAL_TABLET | Freq: Two times a day (BID) | ORAL | Status: DC
  Administered 2014-08-16 – 2014-08-18 (×4): 25 mg via ORAL
  Filled 2014-08-16 (×4): qty 1

## 2014-08-16 MED ORDER — SODIUM CHLORIDE 0.9 % WEIGHT BASED INFUSION: 3.0000 mL/kg/h | INTRAVENOUS | Status: DC

## 2014-08-16 MED ORDER — SODIUM CHLORIDE 0.9 % IJ SOLN: 3.0000 mL | Freq: Two times a day (BID) | INTRAMUSCULAR | Status: DC

## 2014-08-16 MED ORDER — NITROGLYCERIN 2 % TD OINT
0.5000 [in_us] | TOPICAL_OINTMENT | Freq: Four times a day (QID) | TRANSDERMAL | Status: DC
  Administered 2014-08-16 – 2014-08-17 (×5): 0.5 [in_us] via TOPICAL
  Filled 2014-08-16: qty 30

## 2014-08-16 MED ORDER — HEPARIN (PORCINE) IN NACL 2-0.9 UNIT/ML-% IJ SOLN
INTRAMUSCULAR | Status: AC
  Filled 2014-08-16: qty 1000

## 2014-08-16 MED ORDER — HEPARIN SODIUM (PORCINE) 1000 UNIT/ML IJ SOLN
INTRAMUSCULAR | Status: DC | PRN
  Administered 2014-08-16: 3000 [IU] via INTRAVENOUS

## 2014-08-16 MED ORDER — LORAZEPAM 2 MG/ML IJ SOLN
0.5000 mg | Freq: Once | INTRAMUSCULAR | Status: AC
  Administered 2014-08-16: 0.5 mg via INTRAVENOUS

## 2014-08-16 MED ORDER — MORPHINE SULFATE 2 MG/ML IJ SOLN: 2.0000 mg | INTRAMUSCULAR | Status: DC | PRN

## 2014-08-16 MED ORDER — LORAZEPAM 2 MG/ML IJ SOLN
INTRAMUSCULAR | Status: AC
  Filled 2014-08-16: qty 1

## 2014-08-16 MED ORDER — SODIUM CHLORIDE 0.9 % IJ SOLN
3.0000 mL | Freq: Two times a day (BID) | INTRAMUSCULAR | Status: DC
  Administered 2014-08-16 – 2014-08-18 (×4): 3 mL via INTRAVENOUS

## 2014-08-16 MED ORDER — HEPARIN SODIUM (PORCINE) 1000 UNIT/ML IJ SOLN
INTRAMUSCULAR | Status: AC
  Filled 2014-08-16: qty 1

## 2014-08-16 SURGICAL SUPPLY — 15 items
CATH INFINITI 5 FR JL3.5 (CATHETERS) ×3 IMPLANT
CATH INFINITI 5FR ANG PIGTAIL (CATHETERS) ×3 IMPLANT
CATH INFINITI 5FR MULTPACK ANG (CATHETERS) IMPLANT
CATH INFINITI JR4 5F (CATHETERS) ×3 IMPLANT
DEVICE RAD COMP TR BAND LRG (VASCULAR PRODUCTS) ×3 IMPLANT
GLIDESHEATH SLEND SS 6F .021 (SHEATH) ×3 IMPLANT
KIT HEART LEFT (KITS) ×3 IMPLANT
PACK CARDIAC CATHETERIZATION (CUSTOM PROCEDURE TRAY) ×3 IMPLANT
SHEATH PINNACLE 5F 10CM (SHEATH) IMPLANT
SYR MEDRAD MARK V 150ML (SYRINGE) ×3 IMPLANT
TRANSDUCER W/STOPCOCK (MISCELLANEOUS) ×3 IMPLANT
TUBING CIL FLEX 10 FLL-RA (TUBING) ×3 IMPLANT
WIRE EMERALD 3MM-J .035X150CM (WIRE) IMPLANT
WIRE HI TORQ VERSACORE-J 145CM (WIRE) ×2 IMPLANT
WIRE SAFE-T 1.5MM-J .035X260CM (WIRE) ×3 IMPLANT

## 2014-08-16 NOTE — Progress Notes (Signed)
VASCULAR LAB PRELIMINARY  PRELIMINARY  PRELIMINARY  PRELIMINARY  Carotid duplex  completed.    Preliminary report:  Bilateral:  1-39% ICA stenosis.  Vertebral artery flow is antegrade.      Donal Lynam, RVT 08/16/2014, 2:10 PM

## 2014-08-16 NOTE — Progress Notes (Signed)
ANTICOAGULATION CONSULT NOTE  Pharmacy Consult for Heparin  Indication: chest pain/ACS, elevated troponin  Allergies  Allergen Reactions  . Asa [Aspirin]     GI Upset  . Tylenol 8 Hour [Acetaminophen]     GI Upset  . Penicillins Rash    Patient Measurements: Height:  (165.1 cm) Weight: 136 lb (61.689 kg) IBW/kg (Calculated) : 57  Vital Signs: Temp: 97.7 F (36.5 C) (07/22 0953) Temp Source: Oral (07/22 0953) BP: 123/58 mmHg (07/22 0953) Pulse Rate: 61 (07/22 0953)  Labs:  Recent Labs  08/15/14 1807 08/15/14 1857 08/16/14 0208 08/16/14 0551 08/16/14 1110 08/16/14 1345  HGB 10.7* 11.9*  --  9.7*  --   --   HCT 32.8* 35.0*  --  29.4*  --   --   PLT 141*  --   --  226  --   --   APTT 20*  --   --   --   --   --   LABPROT 12.8  --   --  15.4*  --   --   INR 0.94  --   --  1.20  --   --   HEPARINUNFRC  --   --   --   --   --  0.12*  CREATININE 1.45* 1.40* 1.35* 1.29*  --   --   CKTOTAL  --   --   --  49  --   --   CKMB  --   --   --  6.2*  --   --   TROPONINI  --   --  0.69* 1.01* 0.91*  --     Estimated Creatinine Clearance: 26.6 mL/min (by C-G formula based on Cr of 1.29).   Medical History: Past Medical History  Diagnosis Date  . H/O Legionnaire's disease   . Pneumonia   . Anemia   . CKD (chronic kidney disease)   . Hyperlipidemia   . Arthritis   . Postinflammatory pulmonary fibrosis   . Upper airway cough syndrome   . Peripheral edema   . Chronic constipation   . Hypertension     Assessment: Heparin for rising troponin, was here for weakness and undergoing stroke/TIA work-up, Hgb 9.7, Plts wnl, mild renal dysfunction. Heparin level 0.12  Goal of Therapy:  Heparin level 0.3-0.5 units/ml Monitor platelets by anticoagulation protocol: Yes   Plan:  - Heparin bolus 1800 units, increase infusion to 900 units/hr - 2100 HL -Daily CBC/HL -Monitor for bleeding  Greggory Stallion, PharmD Clinical Pharmacist Pager # [phone] 08/16/2014 3:00  PM

## 2014-08-16 NOTE — Interval H&P Note (Signed)
History and Physical Interval Note:  08/16/2014 4:58 PM  Alicia [name]  has presented today for surgery, with the diagnosis of Chest Pain  The various methods of treatment have been discussed with the patient and family. After consideration of risks, benefits and other options for treatment, the patient has consented to  Procedure(s): Left Heart Cath and Coronary Angiography (N/A) with possible coronary angioplasty as a surgical intervention .  The patient's history has been reviewed, patient examined, no change in status, stable for surgery.  I have reviewed the patient's chart and labs.  Questions were answered to the patient's satisfaction.     Bensimhon, Reuel Boom

## 2014-08-16 NOTE — Progress Notes (Signed)
Nutrition Brief Note  Patient identified on the Malnutrition Screening Tool (MST) Report for recent weight loss. Patient with stable weight per documentation in EMR for the past 4 months.  Wt Readings from Last 15 Encounters:  08/16/14 136 lb (61.689 kg)  08/15/14 136 lb (61.689 kg)  04/15/14 136 lb (61.689 kg)  12/17/13 139 lb (63.05 kg)  11/05/13 139 lb (63.05 kg)  10/08/13 140 lb (63.504 kg)    Body mass index is 22.63 kg/(m^2). Patient meets criteria for normal weight based on current BMI.   Current diet order is NPO. Labs and medications reviewed.   No nutrition interventions warranted at this time. If nutrition issues arise, please consult RD.    Joaquin Courts, RD, LDN, CNSC Pager [phone] After Hours Pager [phone]

## 2014-08-16 NOTE — Progress Notes (Addendum)
[facility] PROGRESS NOTE  Alicia [name]  UJW:[id number]  DOB: 31-Oct-1922  DOS: 08/16/2014  PCP: [name], MD  Subjective and Objective: Patient is a pain in the chest located on the left side feels like soreness radiates from her neck to her chest. On examination her pain gets worsened with movement of the flexion of left arm.  Data Reviewed: EKG shows inferolateral T wave inversions which are not present on admission EKG Troponin level elevated. MRI negative for any acute stroke but shows possible cord compression.  Patient's list of allergy with aspirin is only gastric disturbances and does not have any shortness of breath or hives or rash or tongue swelling with it  Assessment and Plan: Stat cardiology consult. Aspirin 325 mg, heparin per cardiology, Lipitor 40. Patient remains nothing by mouth except medication. 2-D echo already ordered Nitroglycerin ointment and morphine when necessary for pain management.  Discussed with neurology about the findings of the cervical cord. Recommends no further workup required for stroke but patient will require stat MRI C-spine to check for cord abnormality. Orders are in, we will consult neurosurgery/neurology depending on the findings of the MRI as per discussion with neurology.  Author: Lynden Oxford, MD [facility] Pager: [phone] 08/16/2014 5:19 AM   If 7PM-7AM, please contact night-coverage at [email or web address], password [facility]

## 2014-08-16 NOTE — Progress Notes (Signed)
Spoke to Dr Allena Katz on the unit regarding my pt having ongoing chest pain that was now radiating to her left jaw along with nausea and positive troponin's. New orders received.

## 2014-08-16 NOTE — Progress Notes (Signed)
ANTICOAGULATION CONSULT NOTE - Initial Consult  Pharmacy Consult for Heparin  Indication: chest pain/ACS, elevated troponin  Allergies  Allergen Reactions  . Asa [Aspirin]     GI Upset  . Tylenol 8 Hour [Acetaminophen]     GI Upset  . Penicillins Rash    Patient Measurements: Height:  (165.1 cm) Weight: 136 lb (61.689 kg) IBW/kg (Calculated) : 57  Vital Signs: Temp: 97.9 F (36.6 C) (07/22 0357) Temp Source: Oral (07/22 0357) BP: 156/59 mmHg (07/22 0425) Pulse Rate: 70 (07/22 0425)  Labs:  Recent Labs  08/15/14 1807 08/15/14 1857 08/16/14 0208  HGB 10.7* 11.9*  --   HCT 32.8* 35.0*  --   PLT 141*  --   --   APTT 20*  --   --   LABPROT 12.8  --   --   INR 0.94  --   --   CREATININE 1.45* 1.40* 1.35*  TROPONINI  --   --  0.69*    Estimated Creatinine Clearance: 25.4 mL/min (by C-G formula based on Cr of 1.35).   Medical History: Past Medical History  Diagnosis Date  . H/O Legionnaire's disease   . Pneumonia   . Anemia   . CKD (chronic kidney disease)   . Hyperlipidemia   . Arthritis   . Postinflammatory pulmonary fibrosis   . Upper airway cough syndrome   . Peripheral edema   . Chronic constipation   . Hypertension     Assessment: Heparin for rising troponin, was here for weakness and undergoing stroke/TIA work-up, Hgb 11.9, mild renal dysfunction   Goal of Therapy:  Heparin level 0.3-0.5 units/ml Monitor platelets by anticoagulation protocol: Yes   Plan:  -Will start heparin at 700 units/hr (no bolus with ongoing stroke w/u) -1400 HL -Daily CBC/HL -Monitor for bleeding  Alicia [name] 08/16/2014,5:23 AM

## 2014-08-16 NOTE — Progress Notes (Signed)
Paged MD on call regarding pt having continued left side chest pain that is now radiating to her jaw along with nausea.  Awaiting orders

## 2014-08-16 NOTE — Progress Notes (Signed)
Utilization review completed. Vineet Kinney, RN, BSN. 

## 2014-08-16 NOTE — Progress Notes (Signed)
Order to administer heparin bolus and rate change received at 1459, verbal order from pharmacy to hold this order if patient was going for cardiac cath within the hour.  Pt going at current time to cath lab, staff made aware of this prior to transport.  Rate change and bolus to be carried out post catheterization.

## 2014-08-16 NOTE — Progress Notes (Signed)
CRITICAL VALUE ALERT  Critical value received:  Troponin 0.69  Date of notification:  08/16/14  Time of notification:  0325  Critical value read back:Yes.    Nurse who received alert:  Abe People  MD notified (1st page):  [place]  Time of first page:  0325  MD notified (2nd page): [place]  Time of second page: [phone]  Responding MD:  Allena Katz  Time MD responded:  [phone]

## 2014-08-16 NOTE — Progress Notes (Addendum)
[facility] PROGRESS NOTE  [name]:[id number] DOB: 1922-07-31 DOA: 08/15/2014 PCP: [name], MD  Summary Chart reviewed. D/w Dr. [place]. 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 MRI brain negative, but MRI C-spine shows severe spinal stenosis with flattening of the cord.  Complicating the case further, patient developed chest pain, elevated troponins and appears to have suffered an NSTEMI. Cardiology has been consulted  Assessment/Plan:  Principal Problem:   Right hand weakness:  Likely secondary to severe spinal stenosis at C3-4.  Have consulted Dr. [name].  We discussed the case over the phone. As patient's symptoms have resolved, she does not require surgery at this time. He recommends that cardiology do whatever intervention is needed. Should patient become symptomatic again down the line, may consider surgery at that point in the future if fails conservative management. Active Problems:   NSTEMI (non-ST elevated myocardial infarction):  For cardiac catheterization later today.   Postinflammatory pulmonary fibrosis stable   AKI (acute kidney injury) stable   Anemia:  Folate pending. Looks like chronic disease. No evidence of bleeding currently.   Spinal stenosis in cervical region:  See above.  HPI/Subjective: No pain currently. No weakness. Develop chest pain overnight with positive troponins and was seen by cardiology.  Objective: Filed Vitals:   08/16/14 0541  BP: 134/60  Pulse: 66  Temp: 98.2 F (36.8 C)  Resp: 16   No intake or output data in the 24 hours ending 08/16/14 0813 Filed Weights   08/16/14 0004  Weight: 61.689 kg (136 lb)    Exam:   General:  Asleep. Arousable. Oriented and appropriate.  Cardiovascular: Regular rate rhythm without murmurs gallops rubs  Respiratory: Clear to  auscultation bilaterally without wheezes rhonchi or rales  Abdomen: Soft nontender nondistended  Ext: No clubbing cyanosis or edema  Neurologic: Cranial nerves intact. Motor strength 5 out of 5 throughout.  Basic Metabolic Panel:  Recent Labs Lab 08/15/14 1807 08/15/14 1857 08/16/14 0208 08/16/14 0551  NA 131* 132*  --  131*  K 5.1  6.2* 5.0  --  4.2  CL 98* 97*  --  99*  CO2 28  --   --  25  GLUCOSE 90 82  --  123*  BUN 28* 27*  --  21*  CREATININE 1.45* 1.40* 1.35* 1.29*  CALCIUM 9.5  --   --  9.1   Liver Function Tests:  Recent Labs Lab 08/15/14 1807 08/16/14 0551  AST 32 19  ALT 11* 7*  ALKPHOS 65 52  BILITOT 1.5* 0.5  PROT 7.6 6.3*  ALBUMIN 3.9 2.9*   No results for input(s): LIPASE, AMYLASE in the last 168 hours. No results for input(s): AMMONIA in the last 168 hours. CBC:  Recent Labs Lab 08/15/14 1807 08/15/14 1857 08/16/14 0551  WBC 5.3  --  6.4  NEUTROABS 3.4  --  5.2  HGB 10.7* 11.9* 9.7*  HCT 32.8* 35.0* 29.4*  MCV 87.2  --  85.5  PLT 141*  --  226   Cardiac Enzymes:  Recent Labs Lab 08/16/14 0208 08/16/14 0551  CKTOTAL  --  49  CKMB  --  6.2*  TROPONINI 0.69* 1.01*   BNP (last 3 results) No results for input(s): BNP in the last 8760 hours.  ProBNP (last 3 results)  Recent Labs  04/15/14 1456  PROBNP 131.0*   CBG: No results for input(s): GLUCAP in the last  168 hours.  No results found for this or any previous visit (from the past 240 hour(s)).   Studies: Ct Head Wo Contrast  08/15/2014   CLINICAL DATA:  81 year old female with right hand weakness  EXAM: CT HEAD WITHOUT CONTRAST  TECHNIQUE: Contiguous axial images were obtained from the base of the skull through the vertex without intravenous contrast.  COMPARISON:  None.  FINDINGS: The ventricles are dilated and the sulci are prominent compatible with age-related atrophy. Periventricular and deep white matter hypodensities represent chronic microvascular ischemic changes.  There is no intracranial hemorrhage. No mass effect or midline shift identified.  The visualized paranasal sinuses and mastoid air cells are well aerated. The calvarium is intact.  IMPRESSION: No acute intracranial pathology.  Age-related atrophy and chronic microvascular ischemic disease.  If symptoms persist and there are no contraindications, MRI may provide better evaluation if clinically indicated.   Electronically Signed   By: Elgie Collard M.D.   On: 08/15/2014 19:04   Mr Brain Wo Contrast (neuro Protocol)  08/15/2014   CLINICAL DATA:  Right arm weakness  EXAM: MRI HEAD WITHOUT CONTRAST  TECHNIQUE: Multiplanar, multiecho pulse sequences of the brain and surrounding structures were obtained without intravenous contrast.  COMPARISON:  CT head 08/15/2014  FINDINGS: Patient was claustrophobic and was not able to complete the study however the majority of the study was completed including diffusion-weighted imaging and axial T2 and FLAIR imaging. No contrast was administered.  Age-appropriate atrophy.  Negative for hydrocephalus.  Negative for acute infarct. Moderate chronic microvascular ischemic changes in the white matter and pons.  Marked disc degeneration and spurring at C3-4 with bone marrow edema. Posterior osteophyte formation is causing cord deformity and spinal stenosis. Possible mild cord compression.  Paranasal sinuses are clear. Left mastoid sinus effusion. Right mastoid sinus clear.  IMPRESSION: Atrophy and chronic microvascular ischemia.  No acute infarct  Marked disc degeneration and spurring at C3-4 causing spinal stenosis and cord deformity. This could be a cause of right arm weakness. Consider cervical spine MRI for follow-up. The patient may need sedation as she was not able to complete the MRI of the brain   Electronically Signed   By: Marlan Palau M.D.   On: 08/15/2014 20:46   Mr Cervical Spine Wo Contrast  08/16/2014   CLINICAL DATA:  Initial evaluation for cervical spinal cord  compression.  EXAM: MRI CERVICAL SPINE WITHOUT CONTRAST  TECHNIQUE: Multiplanar, multisequence MR imaging of the cervical spine was performed. No intravenous contrast was administered.  COMPARISON:  Prior brain MRI from 08/15/2014  FINDINGS: Chronic small vessel ischemic type changes present within the pons. Visualized portions of the brain and posterior fossa are grossly unremarkable. Craniocervical junction widely patent.  Reversal of the normal cervical lordosis with apex at C4-5. There is 3 mm anterolisthesis of C4 on C5, likely chronic. Trace anterolisthesis of C7 on T1. Vertebral body heights maintained. Signal intensity within the vertebral body bone marrow within normal limits.  Signal intensity within the cervical spinal cord is normal.  Paraspinous soft tissues within normal limits. Normal intravascular flow voids within the vertebral arteries bilaterally.  C2-C3: Mild posterior disc bulge with bilateral uncovertebral hypertrophy. There is resultant partial effacement of the ventral thecal sac with mild canal stenosis. Left greater than right facet arthrosis. There is moderate left with mild right foraminal narrowing.  C3-C4: Degenerative endplate changes with intervertebral disc space narrowing. Diffuse disc bulge with bilateral uncovertebral spurring and facet arthrosis, worse on the left. Ligamentum flavum thickening.  There is resultant severe canal stenosis with the thecal sac measuring 4 mm in AP diameter. Flattening of the cervical spinal cord without cord signal changes. Severe bilateral foraminal stenosis present as well, left worse than right.  C4-C5: 3 mm anterolisthesis of C4 on C5. Associated diffuse degenerative disc osteophyte with bilateral uncovertebral hypertrophy and facet arthrosis, right worse than left. Posterior disc osteophyte flattens and indents the ventral thecal sac results in mild canal stenosis. Fairly severe bilateral foraminal narrowing, left worse than right.  C5-C6: Diffuse  broad-based degenerative disc osteophyte with bilateral car vertebral hypertrophy. Superimposed bilateral facet arthrosis, left greater than right. Posterior disc osteophyte flattens and effaces the ventral thecal sac and results in mild to moderate canal stenosis. There is severe left with moderate to severe right foraminal narrowing.  C6-C7: Diffuse degenerative disc osteophyte with left greater than right uncovertebral hypertrophy. Superimposed facet hypertrophy present as well with mild ligamentum flavum thickening. There is very mild canal narrowing at this level. Severe left with moderate right foraminal stenosis.  C7-T1: Trace anterolisthesis of C7 on T1. Mild diffuse disc bulge. No significant stenosis.  Visualized upper thoracic spine within normal limits.  IMPRESSION: 1. Advanced degenerative spondylolysis and facet arthrosis at C3-4 with resultant severe canal stenosis and flattening of the cervical spinal cord. No associated cord signal changes. Thecal sac measures 4 mm in AP diameter at this level. 2. Additional advanced multilevel degenerative spondylolysis with secondary canal and foraminal stenosis as detailed above. No other severe canal stenosis within the cervical spine.   Electronically Signed   By: Rise Mu M.D.   On: 08/16/2014 06:57    Scheduled Meds: . aspirin EC  325 mg Oral Daily  . atorvastatin  80 mg Oral q1800  . famotidine (PEPCID) IV  20 mg Intravenous Daily  . ferrous sulfate  325 mg Oral Q breakfast  . LORazepam      . metoprolol tartrate  25 mg Oral BID  . nitroGLYCERIN  0.5 inch Topical 4 times per day  . vitamin B-12  50 mcg Oral Daily   Continuous Infusions: . heparin      Time spent: 35 minutes  Damont Balles L  [facility] Pager [phone]. If 7PM-7AM, please contact night-coverage at [email or web address], password [facility] 08/16/2014, 8:13 AM  LOS: 1 day

## 2014-08-16 NOTE — Progress Notes (Signed)
TELEMETRY: Reviewed telemetry pt in NSR with one isolated PVC and one blocked PAC: Filed Vitals:   08/16/14 0200 08/16/14 0357 08/16/14 0425 08/16/14 0541  BP: 168/78 129/62 156/59 134/60  Pulse: 84 64 70 66  Temp: 97.8 F (36.6 C) 97.9 F (36.6 C)  98.2 F (36.8 C)  TempSrc:  Oral  Oral  Resp:  16  16  Height:      Weight:      SpO2: 100% 95% 100% 100%   No intake or output data in the 24 hours ending 08/16/14 0749 Filed Weights   08/16/14 0004  Weight: 61.689 kg (136 lb)    Subjective Patient denies any current chest pain. Earlier she complained of mid-substernal chest pain/tightness associated with dyspnea and diaphoresis. She cannot tell me how long it lasted.   [name] aspirin EC  325 mg Oral Daily  . atorvastatin  80 mg Oral q1800  . famotidine (PEPCID) IV  20 mg Intravenous Daily  . ferrous sulfate  325 mg Oral Q breakfast  . LORazepam      . nitroGLYCERIN  0.5 inch Topical 4 times per day  . vitamin B-12  50 mcg Oral Daily   . heparin      LABS: Basic Metabolic Panel:  Recent Labs  11/91/47 1807 08/15/14 1857 08/16/14 0208 08/16/14 0551  NA 131* 132*  --  131*  K 5.1  6.2* 5.0  --  4.2  CL 98* 97*  --  99*  CO2 28  --   --  25  GLUCOSE 90 82  --  123*  BUN 28* 27*  --  21*  CREATININE 1.45* 1.40* 1.35* 1.29*  CALCIUM 9.5  --   --  9.1   Liver Function Tests:  Recent Labs  08/15/14 1807 08/16/14 0551  AST 32 19  ALT 11* 7*  ALKPHOS 65 52  BILITOT 1.5* 0.5  PROT 7.6 6.3*  ALBUMIN 3.9 2.9*   No results for input(s): LIPASE, AMYLASE in the last 72 hours. CBC:  Recent Labs  08/15/14 1807 08/15/14 1857 08/16/14 0551  WBC 5.3  --  6.4  NEUTROABS 3.4  --  5.2  HGB 10.7* 11.9* 9.7*  HCT 32.8* 35.0* 29.4*  MCV 87.2  --  85.5  PLT 141*  --  226   Cardiac Enzymes:  Recent Labs  08/16/14 0208 08/16/14 0551  CKTOTAL  --  49  CKMB  --  6.2*  TROPONINI 0.69* 1.01*   BNP: No results for input(s): PROBNP in the last 72  hours. D-Dimer: No results for input(s): DDIMER in the last 72 hours. Hemoglobin A1C: No results for input(s): HGBA1C in the last 72 hours. Fasting Lipid Panel: No results for input(s): CHOL, HDL, LDLCALC, TRIG, CHOLHDL, LDLDIRECT in the last 72 hours. Thyroid Function Tests: No results for input(s): TSH, T4TOTAL, T3FREE, THYROIDAB in the last 72 hours.  Invalid input(s): FREET3   Radiology/Studies:  Ct Head Wo Contrast  08/15/2014   CLINICAL DATA:  81 year old female with right hand weakness  EXAM: CT HEAD WITHOUT CONTRAST  TECHNIQUE: Contiguous axial images were obtained from the base of the skull through the vertex without intravenous contrast.  COMPARISON:  None.  FINDINGS: The ventricles are dilated and the sulci are prominent compatible with age-related atrophy. Periventricular and deep white matter hypodensities represent chronic microvascular ischemic changes. There is no intracranial hemorrhage. No mass effect or midline shift identified.  The visualized paranasal sinuses and mastoid air cells are well aerated.  The calvarium is intact.  IMPRESSION: No acute intracranial pathology.  Age-related atrophy and chronic microvascular ischemic disease.  If symptoms persist and there are no contraindications, MRI may provide better evaluation if clinically indicated.   Electronically Signed   By: Elgie Collard M.D.   On: 08/15/2014 19:04   Mr Brain Wo Contrast (neuro Protocol)  08/15/2014   CLINICAL DATA:  Right arm weakness  EXAM: MRI HEAD WITHOUT CONTRAST  TECHNIQUE: Multiplanar, multiecho pulse sequences of the brain and surrounding structures were obtained without intravenous contrast.  COMPARISON:  CT head 08/15/2014  FINDINGS: Patient was claustrophobic and was not able to complete the study however the majority of the study was completed including diffusion-weighted imaging and axial T2 and FLAIR imaging. No contrast was administered.  Age-appropriate atrophy.  Negative for hydrocephalus.   Negative for acute infarct. Moderate chronic microvascular ischemic changes in the white matter and pons.  Marked disc degeneration and spurring at C3-4 with bone marrow edema. Posterior osteophyte formation is causing cord deformity and spinal stenosis. Possible mild cord compression.  Paranasal sinuses are clear. Left mastoid sinus effusion. Right mastoid sinus clear.  IMPRESSION: Atrophy and chronic microvascular ischemia.  No acute infarct  Marked disc degeneration and spurring at C3-4 causing spinal stenosis and cord deformity. This could be a cause of right arm weakness. Consider cervical spine MRI for follow-up. The patient may need sedation as she was not able to complete the MRI of the brain   Electronically Signed   By: Marlan Palau M.D.   On: 08/15/2014 20:46   Mr Cervical Spine Wo Contrast  08/16/2014   CLINICAL DATA:  Initial evaluation for cervical spinal cord compression.  EXAM: MRI CERVICAL SPINE WITHOUT CONTRAST  TECHNIQUE: Multiplanar, multisequence MR imaging of the cervical spine was performed. No intravenous contrast was administered.  COMPARISON:  Prior brain MRI from 08/15/2014  FINDINGS: Chronic small vessel ischemic type changes present within the pons. Visualized portions of the brain and posterior fossa are grossly unremarkable. Craniocervical junction widely patent.  Reversal of the normal cervical lordosis with apex at C4-5. There is 3 mm anterolisthesis of C4 on C5, likely chronic. Trace anterolisthesis of C7 on T1. Vertebral body heights maintained. Signal intensity within the vertebral body bone marrow within normal limits.  Signal intensity within the cervical spinal cord is normal.  Paraspinous soft tissues within normal limits. Normal intravascular flow voids within the vertebral arteries bilaterally.  C2-C3: Mild posterior disc bulge with bilateral uncovertebral hypertrophy. There is resultant partial effacement of the ventral thecal sac with mild canal stenosis. Left  greater than right facet arthrosis. There is moderate left with mild right foraminal narrowing.  C3-C4: Degenerative endplate changes with intervertebral disc space narrowing. Diffuse disc bulge with bilateral uncovertebral spurring and facet arthrosis, worse on the left. Ligamentum flavum thickening. There is resultant severe canal stenosis with the thecal sac measuring 4 mm in AP diameter. Flattening of the cervical spinal cord without cord signal changes. Severe bilateral foraminal stenosis present as well, left worse than right.  C4-C5: 3 mm anterolisthesis of C4 on C5. Associated diffuse degenerative disc osteophyte with bilateral uncovertebral hypertrophy and facet arthrosis, right worse than left. Posterior disc osteophyte flattens and indents the ventral thecal sac results in mild canal stenosis. Fairly severe bilateral foraminal narrowing, left worse than right.  C5-C6: Diffuse broad-based degenerative disc osteophyte with bilateral car vertebral hypertrophy. Superimposed bilateral facet arthrosis, left greater than right. Posterior disc osteophyte flattens and effaces the ventral thecal sac  and results in mild to moderate canal stenosis. There is severe left with moderate to severe right foraminal narrowing.  C6-C7: Diffuse degenerative disc osteophyte with left greater than right uncovertebral hypertrophy. Superimposed facet hypertrophy present as well with mild ligamentum flavum thickening. There is very mild canal narrowing at this level. Severe left with moderate right foraminal stenosis.  C7-T1: Trace anterolisthesis of C7 on T1. Mild diffuse disc bulge. No significant stenosis.  Visualized upper thoracic spine within normal limits.  IMPRESSION: 1. Advanced degenerative spondylolysis and facet arthrosis at C3-4 with resultant severe canal stenosis and flattening of the cervical spinal cord. No associated cord signal changes. Thecal sac measures 4 mm in AP diameter at this level. 2. Additional advanced  multilevel degenerative spondylolysis with secondary canal and foraminal stenosis as detailed above. No other severe canal stenosis within the cervical spine.   Electronically Signed   By: Rise Mu M.D.   On: 08/16/2014 06:57   Ecg shows new T wave inversion in the inferolateral leads.   PHYSICAL EXAM General: Well developed, thin, elderly BF in no acute distress. Head: Normocephalic, atraumatic, sclera non-icteric, oropharynx is clear Neck: Negative for carotid bruits. JVD not elevated. No adenopathy Lungs: Clear bilaterally to auscultation without wheezes, rales, or rhonchi. Breathing is unlabored. Heart: RRR S1 S2 with grade 2/6 systolic murmur. LSB Abdomen: Soft, non-tender, non-distended with normoactive bowel sounds. No hepatomegaly. No rebound/guarding. No obvious abdominal masses. Msk:  Strength and tone appears normal for age. Extremities: No clubbing, cyanosis or edema.  Distal pedal pulses are 2+ and equal bilaterally. Neuro: Alert and oriented X 3. Moves all extremities spontaneously. Psych:  Responds to questions appropriately with a normal affect.  ASSESSMENT AND PLAN: 1. NSTEMI. New onset chest pain. Troponin initially normal then 0.69>>1.01. New Ecg changes of ischemia. Patient has no history of CAD. She is a chronic smoker 1 PPD. History of HTN and hyperlipidemia. Now pain free. Continue topical nitrates, IV heparin. Add beta blocker. Statin therapy. I discussed further ischemic work up with her. She is normally active and lives by herself. Able to do her own cooking and cleaning. She has been in fairly good health. I would recommend cardiac cath. Will need to have this evaluation done prior to clearing her for any potential spine procedures.  The procedure and risks were reviewed including but not limited to death, myocardial infarction, stroke, arrythmias, bleeding, transfusion, emergency surgery, dye allergy, or renal dysfunction. The patient voices understanding and  is agreeable to proceed. Will begin hydration now for cath.   2. HTN   3. HL - needs statin  4. Right arm numbness- possibly due to nerve/cord compression and cervical area. Neurosurgery to see. If surgery is anticipated this may impact how we approach coronary revascularization decision- since if stent placed she may need to be on DAPT for an extended period of time. Her cath will not be until later today.  5. History of pulmonary fibrosis.  6. CKD stage 3. Minimize contrast load today. Will check Echo for LV function  7. Anemia.  Present on Admission:  . Postinflammatory pulmonary fibrosis  Signed, Peter [place], MDFACC 08/16/2014 7:49 AM

## 2014-08-16 NOTE — Progress Notes (Signed)
RN, Judeth Cornfield, paged this NP secondary to pt having chest pain with deep breathing. She just arrived on the unit from the ED. VSS. 12 lead EKG-don't see any ST depression or elevation. RN went back to the room and questioned pt as this NP was on the phone-no radiation of pain, no n/v, pain just feels "sore". Sometimes gets "acid". Pt is allergic to ASA, so will hold off on that. Will cycle troponins even though this NP doesn't feel this is cardiac in origin. Try GI cocktail. Asked RN to call back if gets worse or recurs.  Jimmye Norman, NP [facility]

## 2014-08-16 NOTE — Consult Note (Signed)
Referring Physician: Dr. Allena Katz Primary Physician: Primary Cardiologist: Reason for Consultation: positive troponin   HPI: 81 yo woman with HTN, HLD, anemia and CKD stage III who presented to [facility] ED with CC of right hand numbness and right temporal headache.  Initial stroke work up negative except for brain MRI that showed marked DDD and spurring at C3-4 causing spinal stenosis and cord deformity, MRI C-spine just ordered by consulting team.  Patient was transferred from [facility] to [facility] ED.  Upon arrival she was complaining of some left sided chest pain, described as an ache or pressing sensation.  This radiated to left neck and was associated with nausea.  This in addition to positive troponin prompted cardiology consultation.  She denies having these symptoms before.  She received 1 NTG which helped discomfort and currently getting nitro paste applied.  Her ECGs upon arrival to [facility] show new and worsening TWI in the inferior and anterolateral leads.    Review of Systems:     Cardiac Review of Systems: {Y] = yes [ ]  = no  Chest Pain [ x   ]  Resting SOB [   ] Exertional SOB  [  ]  Orthopnea [  ]   Pedal Edema [ x  ]    Palpitations [  ] Syncope  [  ]   Presyncope [   ]  General Review of Systems: [Y] = yes [  ]=no Constitional: recent weight change [  ]; anorexia [  ]; fatigue [  ]; nausea [x  ]; night sweats [  ]; fever [  ]; or chills [  ];                                                                      Eyes : blurred vision [  ]; diplopia [   ]; vision changes [  ];  Amaurosis fugax[  ]; Resp: cough [  ];  wheezing[  ];  hemoptysis[  ];  PND [  ];  GI:  gallstones[  ], vomiting[  ];  dysphagia[  ]; melena[  ];  hematochezia [  ]; heartburn[  ];   GU: kidney stones [  ]; hematuria[  ];   dysuria [  ];  nocturia[  ]; incontinence [  ];             Skin: rash, swelling[  ];, hair loss[  ];  peripheral edema[  ];  or itching[  ]; Musculosketetal: myalgias[  ];  joint swelling[  ];  joint  erythema[  ];  joint pain[  ];  back pain[  ];  Heme/Lymph: bruising[  ];  bleeding[  ];  anemia[  ];  Neuro: TIA[  ];  headaches[ x ];  stroke[  ];  vertigo[  ];  seizures[  ];   paresthesias[x ];  difficulty walking[  ];  Psych:depression[  ]; anxiety[  ];  Endocrine: diabetes[  ];  thyroid dysfunction[  ];  Other:  Past Medical History  Diagnosis Date  . H/O Legionnaire's disease   . Pneumonia   . Anemia   . CKD (chronic kidney disease)   . Hyperlipidemia   . Arthritis   . Postinflammatory pulmonary fibrosis   .  Upper airway cough syndrome   . Peripheral edema   . Chronic constipation   . Hypertension     Medications Prior to Admission  Medication Sig Dispense Refill  . Cyanocobalamin (VITAMIN B 12 PO) Take 1 tablet by mouth daily.    . folic acid (FOLVITE) 1 MG tablet Take 1 mg by mouth daily.    . furosemide (LASIX) 20 MG tablet Take 20 mg by mouth every other day.     . Iron TABS Take 1 tablet by mouth daily.    . Multiple Vitamin (MULTIVITAMIN WITH MINERALS) TABS tablet Take 1 tablet by mouth daily.    . potassium chloride (K-DUR) 10 MEQ tablet Take 10 mEq by mouth every other day.     . famotidine (PEPCID) 20 MG tablet Take 1 tablet (20 mg total) by mouth at bedtime. (Patient not taking: Reported on 08/15/2014) 30 tablet 11  . pantoprazole (PROTONIX) 40 MG tablet Take 1 tablet (40 mg total) by mouth daily. Take 30-60 min before first meal of the day (Patient not taking: Reported on 08/15/2014) 30 tablet 11     . aspirin EC  325 mg Oral Daily  . famotidine (PEPCID) IV  20 mg Intravenous Q12H  . ferrous sulfate  325 mg Oral Q breakfast  . heparin  5,000 Units Subcutaneous 3 times per day  . vitamin B-12  50 mcg Oral Daily    Infusions: . sodium chloride 50 mL/hr at 08/16/14 0058    Allergies  Allergen Reactions  . Asa [Aspirin]     GI Upset  . Tylenol 8 Hour [Acetaminophen]     GI Upset  . Penicillins Rash    History   Social History  . Marital Status:  Married    Spouse Name: N/A  . Number of Children: N/A  . Years of Education: N/A   Occupational History  . Not on file.   Social History Main Topics  . Smoking status: Former Smoker -- 0.50 packs/day for 36 years    Types: Cigarettes    Quit date: 01/25/1977  . Smokeless tobacco: Never Used  . Alcohol Use: No  . Drug Use: No  . Sexual Activity: Not on file   Other Topics Concern  . Not on file   Social History Narrative    Family History  Problem Relation Age of Onset  . Lung cancer Brother     never smoker    PHYSICAL EXAM: Filed Vitals:   08/16/14 0004  BP: 166/79  Pulse: 78  Temp: 98 F (36.7 C)  Resp: 18    No intake or output data in the 24 hours ending 08/16/14 0404  General:  Elderly woman, no acute distress. No respiratory difficulty HEENT: [place], AT, PERRL, EOMI, anicteric, moist MM, clear OP Neck:  no JVD. Carotids 2+ bilat; no bruits. No lymphadenopathy or thryomegaly appreciated. Cor: PMI nondisplaced. Regular rate & rhythm with frequent ectopy. No rubs or gallops. 2/6 systolic murmur. Lungs: clear anterior fields Abdomen: soft, nontender, nondistended. No hepatosplenomegaly. No bruits or masses. Good bowel sounds. Extremities: no cyanosis, clubbing, rash, edema Neuro: alert & oriented x 3, cranial nerves grossly intact. moves all 4 extremities w/o difficulty. Affect pleasant.  ECG: SR with inferior and anterolateral TWI  Results for orders placed or performed during the hospital encounter of 08/15/14 (from the past 24 hour(s))  Urine rapid drug screen (hosp performed)not at [facility]     Status: None   Collection Time: 08/15/14  6:00 PM  Result  Value Ref Range   Opiates NONE DETECTED NONE DETECTED   Cocaine NONE DETECTED NONE DETECTED   Benzodiazepines NONE DETECTED NONE DETECTED   Amphetamines NONE DETECTED NONE DETECTED   Tetrahydrocannabinol NONE DETECTED NONE DETECTED   Barbiturates NONE DETECTED NONE DETECTED  Urinalysis, Routine w reflex  microscopic (not at [facility])     Status: Abnormal   Collection Time: 08/15/14  6:00 PM  Result Value Ref Range   Color, Urine YELLOW YELLOW   APPearance CLEAR CLEAR   Specific Gravity, Urine 1.004 (L) 1.005 - 1.030   pH 6.0 5.0 - 8.0   Glucose, UA NEGATIVE NEGATIVE mg/dL   Hgb urine dipstick NEGATIVE NEGATIVE   Bilirubin Urine NEGATIVE NEGATIVE   Ketones, ur NEGATIVE NEGATIVE mg/dL   Protein, ur NEGATIVE NEGATIVE mg/dL   Urobilinogen, UA 0.2 0.0 - 1.0 mg/dL   Nitrite NEGATIVE NEGATIVE   Leukocytes, UA NEGATIVE NEGATIVE  Ethanol     Status: None   Collection Time: 08/15/14  6:07 PM  Result Value Ref Range   Alcohol, Ethyl (B) <5 <5 mg/dL  Protime-INR     Status: None   Collection Time: 08/15/14  6:07 PM  Result Value Ref Range   Prothrombin Time 12.8 11.6 - 15.2 seconds   INR 0.94 0.00 - 1.49  APTT     Status: Abnormal   Collection Time: 08/15/14  6:07 PM  Result Value Ref Range   aPTT 20 (L) 24 - 37 seconds  CBC     Status: Abnormal   Collection Time: 08/15/14  6:07 PM  Result Value Ref Range   WBC 5.3 4.0 - 10.5 K/uL   RBC 3.76 (L) 3.87 - 5.11 MIL/uL   Hemoglobin 10.7 (L) 12.0 - 15.0 g/dL   HCT 47.8 (L) 29.5 - 62.1 %   MCV 87.2 78.0 - 100.0 fL   MCH 28.5 26.0 - 34.0 pg   MCHC 32.6 30.0 - 36.0 g/dL   RDW 30.8 65.7 - 84.6 %   Platelets 141 (L) 150 - 400 K/uL  Differential     Status: None   Collection Time: 08/15/14  6:07 PM  Result Value Ref Range   Neutrophils Relative % 64 43 - 77 %   Neutro Abs 3.4 1.7 - 7.7 K/uL   Lymphocytes Relative 27 12 - 46 %   Lymphs Abs 1.4 0.7 - 4.0 K/uL   Monocytes Relative 7 3 - 12 %   Monocytes Absolute 0.4 0.1 - 1.0 K/uL   Eosinophils Relative 2 0 - 5 %   Eosinophils Absolute 0.1 0.0 - 0.7 K/uL   Basophils Relative 0 0 - 1 %   Basophils Absolute 0.0 0.0 - 0.1 K/uL  Comprehensive metabolic panel     Status: Abnormal   Collection Time: 08/15/14  6:07 PM  Result Value Ref Range   Sodium 131 (L) 135 - 145 mmol/L   Potassium 6.2 (HH)  3.5 - 5.1 mmol/L   Chloride 98 (L) 101 - 111 mmol/L   CO2 28 22 - 32 mmol/L   Glucose, Bld 90 65 - 99 mg/dL   BUN 28 (H) 6 - 20 mg/dL   Creatinine, Ser 9.62 (H) 0.44 - 1.00 mg/dL   Calcium 9.5 8.9 - 95.2 mg/dL   Total Protein 7.6 6.5 - 8.1 g/dL   Albumin 3.9 3.5 - 5.0 g/dL   AST 32 15 - 41 U/L   ALT 11 (L) 14 - 54 U/L   Alkaline Phosphatase 65 38 - 126 U/L  Total Bilirubin 1.5 (H) 0.3 - 1.2 mg/dL   GFR calc non Af Amer 31 (L) >60 mL/min   GFR calc Af Amer 36 (L) >60 mL/min   Anion gap 5 5 - 15  Potassium     Status: None   Collection Time: 08/15/14  6:07 PM  Result Value Ref Range   Potassium 5.1 3.5 - 5.1 mmol/L  I-stat troponin, ED (not at [facility], [facility])     Status: None   Collection Time: 08/15/14  6:28 PM  Result Value Ref Range   Troponin i, poc 0.01 0.00 - 0.08 ng/mL   Comment 3          I-Stat Chem 8, ED  (not at [facility], [facility])     Status: Abnormal   Collection Time: 08/15/14  6:57 PM  Result Value Ref Range   Sodium 132 (L) 135 - 145 mmol/L   Potassium 5.0 3.5 - 5.1 mmol/L   Chloride 97 (L) 101 - 111 mmol/L   BUN 27 (H) 6 - 20 mg/dL   Creatinine, Ser 1.61 (H) 0.44 - 1.00 mg/dL   Glucose, Bld 82 65 - 99 mg/dL   Calcium, Ion 0.96 0.45 - 1.30 mmol/L   TCO2 24 0 - 100 mmol/L   Hemoglobin 11.9 (L) 12.0 - 15.0 g/dL   HCT 40.9 (L) 81.1 - 91.4 %  Creatinine, serum     Status: Abnormal   Collection Time: 08/16/14  2:08 AM  Result Value Ref Range   Creatinine, Ser 1.35 (H) 0.44 - 1.00 mg/dL   GFR calc non Af Amer 34 (L) >60 mL/min   GFR calc Af Amer 39 (L) >60 mL/min  Iron and TIBC     Status: Abnormal   Collection Time: 08/16/14  2:08 AM  Result Value Ref Range   Iron 26 (L) 28 - 170 ug/dL   TIBC 782 (L) 956 - 213 ug/dL   Saturation Ratios 11 10.4 - 31.8 %   UIBC 215 ug/dL  Ferritin     Status: Abnormal   Collection Time: 08/16/14  2:08 AM  Result Value Ref Range   Ferritin 311 (H) 11 - 307 ng/mL  Vitamin B12     Status: None   Collection Time: 08/16/14  2:08 AM    Result Value Ref Range   Vitamin B-12 853 180 - 914 pg/mL  Troponin I (q 6hr x 3)     Status: Abnormal   Collection Time: 08/16/14  2:08 AM  Result Value Ref Range   Troponin I 0.69 (HH) <0.031 ng/mL   Ct Head Wo Contrast  08/15/2014   CLINICAL DATA:  81 year old female with right hand weakness  EXAM: CT HEAD WITHOUT CONTRAST  TECHNIQUE: Contiguous axial images were obtained from the base of the skull through the vertex without intravenous contrast.  COMPARISON:  None.  FINDINGS: The ventricles are dilated and the sulci are prominent compatible with age-related atrophy. Periventricular and deep white matter hypodensities represent chronic microvascular ischemic changes. There is no intracranial hemorrhage. No mass effect or midline shift identified.  The visualized paranasal sinuses and mastoid air cells are well aerated. The calvarium is intact.  IMPRESSION: No acute intracranial pathology.  Age-related atrophy and chronic microvascular ischemic disease.  If symptoms persist and there are no contraindications, MRI may provide better evaluation if clinically indicated.   Electronically Signed   By: Elgie Collard M.D.   On: 08/15/2014 19:04   Mr Brain Wo Contrast (neuro Protocol)  08/15/2014   CLINICAL  DATA:  Right arm weakness  EXAM: MRI HEAD WITHOUT CONTRAST  TECHNIQUE: Multiplanar, multiecho pulse sequences of the brain and surrounding structures were obtained without intravenous contrast.  COMPARISON:  CT head 08/15/2014  FINDINGS: Patient was claustrophobic and was not able to complete the study however the majority of the study was completed including diffusion-weighted imaging and axial T2 and FLAIR imaging. No contrast was administered.  Age-appropriate atrophy.  Negative for hydrocephalus.  Negative for acute infarct. Moderate chronic microvascular ischemic changes in the white matter and pons.  Marked disc degeneration and spurring at C3-4 with bone marrow edema. Posterior osteophyte  formation is causing cord deformity and spinal stenosis. Possible mild cord compression.  Paranasal sinuses are clear. Left mastoid sinus effusion. Right mastoid sinus clear.  IMPRESSION: Atrophy and chronic microvascular ischemia.  No acute infarct  Marked disc degeneration and spurring at C3-4 causing spinal stenosis and cord deformity. This could be a cause of right arm weakness. Consider cervical spine MRI for follow-up. The patient may need sedation as she was not able to complete the MRI of the brain   Electronically Signed   By: Marlan Palau M.D.   On: 08/15/2014 20:46     ASSESSMENT: 81 yo woman admitted with right hand numbness and headache with negative stroke work up but possible C3-4 DDD and spurring with mild cord compression who developed left sided chest pain concerning for angina with associated inferolateral TWI and mildly elevated troponin consistent with NSTEMI   PLAN/DISCUSSION: NTG for pain Consider low dose beta blocker given elevated BP and normal HR in setting of CP ASA and heparin drip if no contraindication Statin Cycle troponins to peak Echo in AM Risk stratification and risk factor modification Keep NPO in case further testing/intervention required

## 2014-08-16 NOTE — H&P (View-Only) (Signed)
TELEMETRY: Reviewed telemetry pt in NSR with one isolated PVC and one blocked PAC: Filed Vitals:   08/16/14 0200 08/16/14 0357 08/16/14 0425 08/16/14 0541  BP: 168/78 129/62 156/59 134/60  Pulse: 84 64 70 66  Temp: 97.8 F (36.6 C) 97.9 F (36.6 C)  98.2 F (36.8 C)  TempSrc:  Oral  Oral  Resp:  16  16  Height:      Weight:      SpO2: 100% 95% 100% 100%   No intake or output data in the 24 hours ending 08/16/14 0749 Filed Weights   08/16/14 0004  Weight: 61.689 kg (136 lb)    Subjective Patient denies any current chest pain. Earlier she complained of mid-substernal chest pain/tightness associated with dyspnea and diaphoresis. She cannot tell me how long it lasted.   [name] aspirin EC  325 mg Oral Daily  . atorvastatin  80 mg Oral q1800  . famotidine (PEPCID) IV  20 mg Intravenous Daily  . ferrous sulfate  325 mg Oral Q breakfast  . LORazepam      . nitroGLYCERIN  0.5 inch Topical 4 times per day  . vitamin B-12  50 mcg Oral Daily   . heparin      LABS: Basic Metabolic Panel:  Recent Labs  11/91/47 1807 08/15/14 1857 08/16/14 0208 08/16/14 0551  NA 131* 132*  --  131*  K 5.1  6.2* 5.0  --  4.2  CL 98* 97*  --  99*  CO2 28  --   --  25  GLUCOSE 90 82  --  123*  BUN 28* 27*  --  21*  CREATININE 1.45* 1.40* 1.35* 1.29*  CALCIUM 9.5  --   --  9.1   Liver Function Tests:  Recent Labs  08/15/14 1807 08/16/14 0551  AST 32 19  ALT 11* 7*  ALKPHOS 65 52  BILITOT 1.5* 0.5  PROT 7.6 6.3*  ALBUMIN 3.9 2.9*   No results for input(s): LIPASE, AMYLASE in the last 72 hours. CBC:  Recent Labs  08/15/14 1807 08/15/14 1857 08/16/14 0551  WBC 5.3  --  6.4  NEUTROABS 3.4  --  5.2  HGB 10.7* 11.9* 9.7*  HCT 32.8* 35.0* 29.4*  MCV 87.2  --  85.5  PLT 141*  --  226   Cardiac Enzymes:  Recent Labs  08/16/14 0208 08/16/14 0551  CKTOTAL  --  49  CKMB  --  6.2*  TROPONINI 0.69* 1.01*   BNP: No results for input(s): PROBNP in the last 72  hours. D-Dimer: No results for input(s): DDIMER in the last 72 hours. Hemoglobin A1C: No results for input(s): HGBA1C in the last 72 hours. Fasting Lipid Panel: No results for input(s): CHOL, HDL, LDLCALC, TRIG, CHOLHDL, LDLDIRECT in the last 72 hours. Thyroid Function Tests: No results for input(s): TSH, T4TOTAL, T3FREE, THYROIDAB in the last 72 hours.  Invalid input(s): FREET3   Radiology/Studies:  Ct Head Wo Contrast  08/15/2014   CLINICAL DATA:  81 year old female with right hand weakness  EXAM: CT HEAD WITHOUT CONTRAST  TECHNIQUE: Contiguous axial images were obtained from the base of the skull through the vertex without intravenous contrast.  COMPARISON:  None.  FINDINGS: The ventricles are dilated and the sulci are prominent compatible with age-related atrophy. Periventricular and deep white matter hypodensities represent chronic microvascular ischemic changes. There is no intracranial hemorrhage. No mass effect or midline shift identified.  The visualized paranasal sinuses and mastoid air cells are well aerated.  The calvarium is intact.  IMPRESSION: No acute intracranial pathology.  Age-related atrophy and chronic microvascular ischemic disease.  If symptoms persist and there are no contraindications, MRI may provide better evaluation if clinically indicated.   Electronically Signed   By: Elgie Collard M.D.   On: 08/15/2014 19:04   Mr Brain Wo Contrast (neuro Protocol)  08/15/2014   CLINICAL DATA:  Right arm weakness  EXAM: MRI HEAD WITHOUT CONTRAST  TECHNIQUE: Multiplanar, multiecho pulse sequences of the brain and surrounding structures were obtained without intravenous contrast.  COMPARISON:  CT head 08/15/2014  FINDINGS: Patient was claustrophobic and was not able to complete the study however the majority of the study was completed including diffusion-weighted imaging and axial T2 and FLAIR imaging. No contrast was administered.  Age-appropriate atrophy.  Negative for hydrocephalus.   Negative for acute infarct. Moderate chronic microvascular ischemic changes in the white matter and pons.  Marked disc degeneration and spurring at C3-4 with bone marrow edema. Posterior osteophyte formation is causing cord deformity and spinal stenosis. Possible mild cord compression.  Paranasal sinuses are clear. Left mastoid sinus effusion. Right mastoid sinus clear.  IMPRESSION: Atrophy and chronic microvascular ischemia.  No acute infarct  Marked disc degeneration and spurring at C3-4 causing spinal stenosis and cord deformity. This could be a cause of right arm weakness. Consider cervical spine MRI for follow-up. The patient may need sedation as she was not able to complete the MRI of the brain   Electronically Signed   By: Marlan Palau M.D.   On: 08/15/2014 20:46   Mr Cervical Spine Wo Contrast  08/16/2014   CLINICAL DATA:  Initial evaluation for cervical spinal cord compression.  EXAM: MRI CERVICAL SPINE WITHOUT CONTRAST  TECHNIQUE: Multiplanar, multisequence MR imaging of the cervical spine was performed. No intravenous contrast was administered.  COMPARISON:  Prior brain MRI from 08/15/2014  FINDINGS: Chronic small vessel ischemic type changes present within the pons. Visualized portions of the brain and posterior fossa are grossly unremarkable. Craniocervical junction widely patent.  Reversal of the normal cervical lordosis with apex at C4-5. There is 3 mm anterolisthesis of C4 on C5, likely chronic. Trace anterolisthesis of C7 on T1. Vertebral body heights maintained. Signal intensity within the vertebral body bone marrow within normal limits.  Signal intensity within the cervical spinal cord is normal.  Paraspinous soft tissues within normal limits. Normal intravascular flow voids within the vertebral arteries bilaterally.  C2-C3: Mild posterior disc bulge with bilateral uncovertebral hypertrophy. There is resultant partial effacement of the ventral thecal sac with mild canal stenosis. Left  greater than right facet arthrosis. There is moderate left with mild right foraminal narrowing.  C3-C4: Degenerative endplate changes with intervertebral disc space narrowing. Diffuse disc bulge with bilateral uncovertebral spurring and facet arthrosis, worse on the left. Ligamentum flavum thickening. There is resultant severe canal stenosis with the thecal sac measuring 4 mm in AP diameter. Flattening of the cervical spinal cord without cord signal changes. Severe bilateral foraminal stenosis present as well, left worse than right.  C4-C5: 3 mm anterolisthesis of C4 on C5. Associated diffuse degenerative disc osteophyte with bilateral uncovertebral hypertrophy and facet arthrosis, right worse than left. Posterior disc osteophyte flattens and indents the ventral thecal sac results in mild canal stenosis. Fairly severe bilateral foraminal narrowing, left worse than right.  C5-C6: Diffuse broad-based degenerative disc osteophyte with bilateral car vertebral hypertrophy. Superimposed bilateral facet arthrosis, left greater than right. Posterior disc osteophyte flattens and effaces the ventral thecal sac  and results in mild to moderate canal stenosis. There is severe left with moderate to severe right foraminal narrowing.  C6-C7: Diffuse degenerative disc osteophyte with left greater than right uncovertebral hypertrophy. Superimposed facet hypertrophy present as well with mild ligamentum flavum thickening. There is very mild canal narrowing at this level. Severe left with moderate right foraminal stenosis.  C7-T1: Trace anterolisthesis of C7 on T1. Mild diffuse disc bulge. No significant stenosis.  Visualized upper thoracic spine within normal limits.  IMPRESSION: 1. Advanced degenerative spondylolysis and facet arthrosis at C3-4 with resultant severe canal stenosis and flattening of the cervical spinal cord. No associated cord signal changes. Thecal sac measures 4 mm in AP diameter at this level. 2. Additional advanced  multilevel degenerative spondylolysis with secondary canal and foraminal stenosis as detailed above. No other severe canal stenosis within the cervical spine.   Electronically Signed   By: Rise Mu M.D.   On: 08/16/2014 06:57   Ecg shows new T wave inversion in the inferolateral leads.   PHYSICAL EXAM General: Well developed, thin, elderly BF in no acute distress. Head: Normocephalic, atraumatic, sclera non-icteric, oropharynx is clear Neck: Negative for carotid bruits. JVD not elevated. No adenopathy Lungs: Clear bilaterally to auscultation without wheezes, rales, or rhonchi. Breathing is unlabored. Heart: RRR S1 S2 with grade 2/6 systolic murmur. LSB Abdomen: Soft, non-tender, non-distended with normoactive bowel sounds. No hepatomegaly. No rebound/guarding. No obvious abdominal masses. Msk:  Strength and tone appears normal for age. Extremities: No clubbing, cyanosis or edema.  Distal pedal pulses are 2+ and equal bilaterally. Neuro: Alert and oriented X 3. Moves all extremities spontaneously. Psych:  Responds to questions appropriately with a normal affect.  ASSESSMENT AND PLAN: 1. NSTEMI. New onset chest pain. Troponin initially normal then 0.69>>1.01. New Ecg changes of ischemia. Patient has no history of CAD. She is a chronic smoker 1 PPD. History of HTN and hyperlipidemia. Now pain free. Continue topical nitrates, IV heparin. Add beta blocker. Statin therapy. I discussed further ischemic work up with her. She is normally active and lives by herself. Able to do her own cooking and cleaning. She has been in fairly good health. I would recommend cardiac cath. Will need to have this evaluation done prior to clearing her for any potential spine procedures.  The procedure and risks were reviewed including but not limited to death, myocardial infarction, stroke, arrythmias, bleeding, transfusion, emergency surgery, dye allergy, or renal dysfunction. The patient voices understanding and  is agreeable to proceed. Will begin hydration now for cath.   2. HTN   3. HL - needs statin  4. Right arm numbness- possibly due to nerve/cord compression and cervical area. Neurosurgery to see. If surgery is anticipated this may impact how we approach coronary revascularization decision- since if stent placed she may need to be on DAPT for an extended period of time. Her cath will not be until later today.  5. History of pulmonary fibrosis.  6. CKD stage 3. Minimize contrast load today. Will check Echo for LV function  7. Anemia.  Present on Admission:  . Postinflammatory pulmonary fibrosis  Signed, Peter [place], MDFACC 08/16/2014 7:49 AM

## 2014-08-17 DIAGNOSIS — N189 Chronic kidney disease, unspecified: Secondary | ICD-10-CM | POA: Insufficient documentation

## 2014-08-17 DIAGNOSIS — G959 Disease of spinal cord, unspecified: Secondary | ICD-10-CM

## 2014-08-17 DIAGNOSIS — G9520 Unspecified cord compression: Secondary | ICD-10-CM | POA: Insufficient documentation

## 2014-08-17 DIAGNOSIS — N289 Disorder of kidney and ureter, unspecified: Secondary | ICD-10-CM

## 2014-08-17 DIAGNOSIS — I5181 Takotsubo syndrome: Secondary | ICD-10-CM | POA: Diagnosis present

## 2014-08-17 DIAGNOSIS — I214 Non-ST elevation (NSTEMI) myocardial infarction: Secondary | ICD-10-CM

## 2014-08-17 LAB — CBC
HCT: 29.5 % — ABNORMAL LOW (ref 36.0–46.0)
Hemoglobin: 9.7 g/dL — ABNORMAL LOW (ref 12.0–15.0)
MCH: 28.2 pg (ref 26.0–34.0)
MCHC: 32.9 g/dL (ref 30.0–36.0)
MCV: 85.8 fL (ref 78.0–100.0)
PLATELETS: 280 10*3/uL (ref 150–400)
RBC: 3.44 MIL/uL — ABNORMAL LOW (ref 3.87–5.11)
RDW: 13.8 % (ref 11.5–15.5)
WBC: 6.9 10*3/uL (ref 4.0–10.5)

## 2014-08-17 LAB — HEMOGLOBIN A1C
HEMOGLOBIN A1C: 5.4 % (ref 4.8–5.6)
MEAN PLASMA GLUCOSE: 108 mg/dL

## 2014-08-17 MED ORDER — FAMOTIDINE 20 MG PO TABS
20.0000 mg | ORAL_TABLET | Freq: Two times a day (BID) | ORAL | Status: DC
  Administered 2014-08-17 – 2014-08-18 (×2): 20 mg via ORAL
  Filled 2014-08-17 (×4): qty 1

## 2014-08-17 MED ORDER — ALUM & MAG HYDROXIDE-SIMETH 200-200-20 MG/5ML PO SUSP
30.0000 mL | ORAL | Status: DC | PRN
  Administered 2014-08-17: 07:00:00 30 mL via ORAL
  Filled 2014-08-17: qty 30

## 2014-08-17 NOTE — Progress Notes (Signed)
Primary cardiologist:  New,  Peter [place]  SUBJECTIVE:  The patient is not having any chest pain. She has some mild abdominal discomfort. The cath site at her right radial is stable. She still questions an unusual feeling in the right side of her face. Cardiac catheterization was done yesterday. It showed no significant obstructive disease. However, echo done yesterday revealed apical akinesia. The most likely scenario is a Takot-subo event. Her EF in the Echo lab was in the 45% range. With her renal insufficiency, I'm hesitant to recommend an ACE. I'm not sure that her heart rate would tolerate a beta blocker. For today I've chosen to not in any medications.   Filed Vitals:   08/16/14 2010 08/16/14 2340 08/17/14 0000 08/17/14 0442  BP: 144/60 131/56  128/62  Pulse: 77 72  65  Temp: 98.6 F (37 C) 97.7 F (36.5 C)  99.1 F (37.3 C)  TempSrc: Oral Oral  Oral  Resp: 19 20  18   Height:      Weight:   134 lb 0.6 oz (60.8 kg)   SpO2: 100% 98%  94%     Intake/Output Summary (Last 24 hours) at 08/17/14 0736 Last data filed at 08/17/14 0444  Gross per 24 hour  Intake  552.5 ml  Output    500 ml  Net   52.5 ml    LABS: Basic Metabolic Panel:  Recent Labs  96/04/54 1807 08/15/14 1857 08/16/14 0208 08/16/14 0551  NA 131* 132*  --  131*  K 5.1  6.2* 5.0  --  4.2  CL 98* 97*  --  99*  CO2 28  --   --  25  GLUCOSE 90 82  --  123*  BUN 28* 27*  --  21*  CREATININE 1.45* 1.40* 1.35* 1.29*  CALCIUM 9.5  --   --  9.1   Liver Function Tests:  Recent Labs  08/15/14 1807 08/16/14 0551  AST 32 19  ALT 11* 7*  ALKPHOS 65 52  BILITOT 1.5* 0.5  PROT 7.6 6.3*  ALBUMIN 3.9 2.9*   No results for input(s): LIPASE, AMYLASE in the last 72 hours. CBC:  Recent Labs  08/15/14 1807  08/16/14 0551 08/17/14 0305  WBC 5.3  --  6.4 6.9  NEUTROABS 3.4  --  5.2  --   HGB 10.7*  < > 9.7* 9.7*  HCT 32.8*  < > 29.4* 29.5*  MCV 87.2  --  85.5 85.8  PLT 141*  --  226 280  < > = values  in this interval not displayed. Cardiac Enzymes:  Recent Labs  08/16/14 0208 08/16/14 0551 08/16/14 1110  CKTOTAL  --  49  --   CKMB  --  6.2*  --   TROPONINI 0.69* 1.01* 0.91*   BNP: Invalid input(s): POCBNP D-Dimer: No results for input(s): DDIMER in the last 72 hours. Hemoglobin A1C:  Recent Labs  08/16/14 0208  HGBA1C 5.4   Fasting Lipid Panel: No results for input(s): CHOL, HDL, LDLCALC, TRIG, CHOLHDL, LDLDIRECT in the last 72 hours. Thyroid Function Tests: No results for input(s): TSH, T4TOTAL, T3FREE, THYROIDAB in the last 72 hours.  Invalid input(s): FREET3  RADIOLOGY: Ct Head Wo Contrast  08/15/2014   CLINICAL DATA:  81 year old female with right hand weakness  EXAM: CT HEAD WITHOUT CONTRAST  TECHNIQUE: Contiguous axial images were obtained from the base of the skull through the vertex without intravenous contrast.  COMPARISON:  None.  FINDINGS: The ventricles are dilated and  the sulci are prominent compatible with age-related atrophy. Periventricular and deep white matter hypodensities represent chronic microvascular ischemic changes. There is no intracranial hemorrhage. No mass effect or midline shift identified.  The visualized paranasal sinuses and mastoid air cells are well aerated. The calvarium is intact.  IMPRESSION: No acute intracranial pathology.  Age-related atrophy and chronic microvascular ischemic disease.  If symptoms persist and there are no contraindications, MRI may provide better evaluation if clinically indicated.   Electronically Signed   By: Elgie Collard M.D.   On: 08/15/2014 19:04   Mr Brain Wo Contrast (neuro Protocol)  08/15/2014   CLINICAL DATA:  Right arm weakness  EXAM: MRI HEAD WITHOUT CONTRAST  TECHNIQUE: Multiplanar, multiecho pulse sequences of the brain and surrounding structures were obtained without intravenous contrast.  COMPARISON:  CT head 08/15/2014  FINDINGS: Patient was claustrophobic and was not able to complete the study  however the majority of the study was completed including diffusion-weighted imaging and axial T2 and FLAIR imaging. No contrast was administered.  Age-appropriate atrophy.  Negative for hydrocephalus.  Negative for acute infarct. Moderate chronic microvascular ischemic changes in the white matter and pons.  Marked disc degeneration and spurring at C3-4 with bone marrow edema. Posterior osteophyte formation is causing cord deformity and spinal stenosis. Possible mild cord compression.  Paranasal sinuses are clear. Left mastoid sinus effusion. Right mastoid sinus clear.  IMPRESSION: Atrophy and chronic microvascular ischemia.  No acute infarct  Marked disc degeneration and spurring at C3-4 causing spinal stenosis and cord deformity. This could be a cause of right arm weakness. Consider cervical spine MRI for follow-up. The patient may need sedation as she was not able to complete the MRI of the brain   Electronically Signed   By: Marlan Palau M.D.   On: 08/15/2014 20:46   Mr Cervical Spine Wo Contrast  08/16/2014   CLINICAL DATA:  Initial evaluation for cervical spinal cord compression.  EXAM: MRI CERVICAL SPINE WITHOUT CONTRAST  TECHNIQUE: Multiplanar, multisequence MR imaging of the cervical spine was performed. No intravenous contrast was administered.  COMPARISON:  Prior brain MRI from 08/15/2014  FINDINGS: Chronic small vessel ischemic type changes present within the pons. Visualized portions of the brain and posterior fossa are grossly unremarkable. Craniocervical junction widely patent.  Reversal of the normal cervical lordosis with apex at C4-5. There is 3 mm anterolisthesis of C4 on C5, likely chronic. Trace anterolisthesis of C7 on T1. Vertebral body heights maintained. Signal intensity within the vertebral body bone marrow within normal limits.  Signal intensity within the cervical spinal cord is normal.  Paraspinous soft tissues within normal limits. Normal intravascular flow voids within the  vertebral arteries bilaterally.  C2-C3: Mild posterior disc bulge with bilateral uncovertebral hypertrophy. There is resultant partial effacement of the ventral thecal sac with mild canal stenosis. Left greater than right facet arthrosis. There is moderate left with mild right foraminal narrowing.  C3-C4: Degenerative endplate changes with intervertebral disc space narrowing. Diffuse disc bulge with bilateral uncovertebral spurring and facet arthrosis, worse on the left. Ligamentum flavum thickening. There is resultant severe canal stenosis with the thecal sac measuring 4 mm in AP diameter. Flattening of the cervical spinal cord without cord signal changes. Severe bilateral foraminal stenosis present as well, left worse than right.  C4-C5: 3 mm anterolisthesis of C4 on C5. Associated diffuse degenerative disc osteophyte with bilateral uncovertebral hypertrophy and facet arthrosis, right worse than left. Posterior disc osteophyte flattens and indents the ventral thecal sac results  in mild canal stenosis. Fairly severe bilateral foraminal narrowing, left worse than right.  C5-C6: Diffuse broad-based degenerative disc osteophyte with bilateral car vertebral hypertrophy. Superimposed bilateral facet arthrosis, left greater than right. Posterior disc osteophyte flattens and effaces the ventral thecal sac and results in mild to moderate canal stenosis. There is severe left with moderate to severe right foraminal narrowing.  C6-C7: Diffuse degenerative disc osteophyte with left greater than right uncovertebral hypertrophy. Superimposed facet hypertrophy present as well with mild ligamentum flavum thickening. There is very mild canal narrowing at this level. Severe left with moderate right foraminal stenosis.  C7-T1: Trace anterolisthesis of C7 on T1. Mild diffuse disc bulge. No significant stenosis.  Visualized upper thoracic spine within normal limits.  IMPRESSION: 1. Advanced degenerative spondylolysis and facet  arthrosis at C3-4 with resultant severe canal stenosis and flattening of the cervical spinal cord. No associated cord signal changes. Thecal sac measures 4 mm in AP diameter at this level. 2. Additional advanced multilevel degenerative spondylolysis with secondary canal and foraminal stenosis as detailed above. No other severe canal stenosis within the cervical spine.   Electronically Signed   By: Rise Mu M.D.   On: 08/16/2014 06:57    PHYSICAL EXAM  Patient is oriented to person time and place. She is frail in appearance. Lungs are clear. Respiratory effort is not labored. Cardiac exam reveals S1 and S2. There is no peripheral edema. The right radial cath site is stable.  TELEMETRY:  I have personally reviewed telemetry today August 17, 2014. There is normal sinus rhythm.   ASSESSMENT AND PLAN:    Right hand weakness     This is being fully evaluated from the viewpoint of a neurologic abnormality. There has been question of spine surgery. Ultimately she will be stable for this. 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     Postinflammatory pulmonary fibrosis   AKI (acute kidney injury)   Anemia   Spinal stenosis in cervical region    NSTEMI (non-ST elevated myocardial infarction)     Her echo is suggestive of Takot-subo cardiomyopathy event. We would expect her left ventricular function to normalize over time. I feel it is not prudent to add an ACE inhibitor at this time. I've chosen not to add a beta blocker this morning. Ultimately the addition of carvedilol may be appropriate.  From the cardiac viewpoint, it would appear that further hospitalization with ambulation today would be most appropriate. She has some mild abdominal discomfort. She will be seen by her primary team. In the future spine surgery could be considered, but it would be optimal to wait until she has further  assessment of her LV function after this current admission. We will continue to follow her in the hospital. At the time of discharge she would need early follow-up with our team including Dr. [place] or one of our PA/NP team.   Alicia [name] 08/17/2014 7:36 AM

## 2014-08-17 NOTE — Progress Notes (Signed)
[facility] PROGRESS NOTE  CARMISHA LARUSSO ZOX:[id number] DOB: [birth year] DOA: 08/15/2014 PCP: [name], MD  Summary Chart reviewed. D/w Dr. [place]. 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 MRI brain negative, but MRI C-spine shows severe spinal stenosis with flattening of the cord.  Complicating the case further, patient developed chest pain, elevated troponins and appears to have suffered an NSTEMI.   Assessment/Plan:  Principal Problem:   Right hand weakness:  Resolved.  Discussed with Dr. [name].  As patient's symptoms have resolved, she does not require surgery at this time. Should patient become symptomatic again down the line, may consider surgery at that point in the future if fails conservative management. Active Problems:   NSTEMI (non-ST elevated myocardial infarction):  Cardiac catheterization without significant CAD Echocardiogram shows likely Takatsubo cardiomyopathy. Medications adjusted by cardiology   Postinflammatory pulmonary fibrosis stable   AKI (acute kidney injury) stable   Anemia:  Folate pending. Looks like chronic disease. No evidence of bleeding currently.   Spinal stenosis in cervical region:  See above. Reported abdominal pain: Patient reports likely from aspirin. Improved. Takes Pepcid as an outpatient. Will resume.  Home tomorrow if stable.  HPI/Subjective: Nurse reports dyspepsia earlier today. Patient denies currently. No chest pain or dyspnea. No return of right hand weakness.  Objective: Filed Vitals:   08/17/14 0804  BP: 137/58  Pulse: 64  Temp: 99.5 F (37.5 C)  Resp: 18    Intake/Output Summary (Last 24 hours) at 08/17/14 1005 Last data filed at 08/17/14 0827  Gross per 24 hour  Intake  792.5 ml  Output    500 ml  Net  292.5 ml   Filed Weights   08/16/14 0004 08/16/14  1602 08/17/14 0000  Weight: 61.689 kg (136 lb) 60.6 kg (133 lb 9.6 oz) 60.8 kg (134 lb 0.6 oz)    Exam:   General:  Walking with physical therapy. Oriented and appropriate.  Cardiovascular: Regular rate rhythm without murmurs gallops rubs  Respiratory: Clear to auscultation bilaterally without wheezes rhonchi or rales  Abdomen: Soft nontender nondistended  Ext: No clubbing cyanosis or edema. Cath site at right wrist without abnormality  Neurologic: Cranial nerves intact. Motor strength 5 out of 5 throughout.  Basic Metabolic Panel:  Recent Labs Lab 08/15/14 1807 08/15/14 1857 08/16/14 0208 08/16/14 0551  NA 131* 132*  --  131*  K 5.1  6.2* 5.0  --  4.2  CL 98* 97*  --  99*  CO2 28  --   --  25  GLUCOSE 90 82  --  123*  BUN 28* 27*  --  21*  CREATININE 1.45* 1.40* 1.35* 1.29*  CALCIUM 9.5  --   --  9.1   Liver Function Tests:  Recent Labs Lab 08/15/14 1807 08/16/14 0551  AST 32 19  ALT 11* 7*  ALKPHOS 65 52  BILITOT 1.5* 0.5  PROT 7.6 6.3*  ALBUMIN 3.9 2.9*   No results for input(s): LIPASE, AMYLASE in the last 168 hours. No results for input(s): AMMONIA in the last 168 hours. CBC:  Recent Labs Lab 08/15/14 1807 08/15/14 1857 08/16/14 0551 08/17/14 0305  WBC 5.3  --  6.4 6.9  NEUTROABS 3.4  --  5.2  --   HGB 10.7* 11.9* 9.7* 9.7*  HCT 32.8* 35.0* 29.4* 29.5*  MCV 87.2  --  85.5 85.8  PLT 141*  --  226 280  Cardiac Enzymes:  Recent Labs Lab 08/16/14 0208 08/16/14 0551 08/16/14 1110  CKTOTAL  --  49  --   CKMB  --  6.2*  --   TROPONINI 0.69* 1.01* 0.91*   BNP (last 3 results) No results for input(s): BNP in the last 8760 hours.  ProBNP (last 3 results)  Recent Labs  04/15/14 1456  PROBNP 131.0*   CBG: No results for input(s): GLUCAP in the last 168 hours.  No results found for this or any previous visit (from the past 240 hour(s)).   Studies: Ct Head Wo Contrast  08/15/2014   CLINICAL DATA:  81 year old female with right  hand weakness  EXAM: CT HEAD WITHOUT CONTRAST  TECHNIQUE: Contiguous axial images were obtained from the base of the skull through the vertex without intravenous contrast.  COMPARISON:  None.  FINDINGS: The ventricles are dilated and the sulci are prominent compatible with age-related atrophy. Periventricular and deep white matter hypodensities represent chronic microvascular ischemic changes. There is no intracranial hemorrhage. No mass effect or midline shift identified.  The visualized paranasal sinuses and mastoid air cells are well aerated. The calvarium is intact.  IMPRESSION: No acute intracranial pathology.  Age-related atrophy and chronic microvascular ischemic disease.  If symptoms persist and there are no contraindications, MRI may provide better evaluation if clinically indicated.   Electronically Signed   By: Elgie Collard M.D.   On: 08/15/2014 19:04   Mr Brain Wo Contrast (neuro Protocol)  08/15/2014   CLINICAL DATA:  Right arm weakness  EXAM: MRI HEAD WITHOUT CONTRAST  TECHNIQUE: Multiplanar, multiecho pulse sequences of the brain and surrounding structures were obtained without intravenous contrast.  COMPARISON:  CT head 08/15/2014  FINDINGS: Patient was claustrophobic and was not able to complete the study however the majority of the study was completed including diffusion-weighted imaging and axial T2 and FLAIR imaging. No contrast was administered.  Age-appropriate atrophy.  Negative for hydrocephalus.  Negative for acute infarct. Moderate chronic microvascular ischemic changes in the white matter and pons.  Marked disc degeneration and spurring at C3-4 with bone marrow edema. Posterior osteophyte formation is causing cord deformity and spinal stenosis. Possible mild cord compression.  Paranasal sinuses are clear. Left mastoid sinus effusion. Right mastoid sinus clear.  IMPRESSION: Atrophy and chronic microvascular ischemia.  No acute infarct  Marked disc degeneration and spurring at C3-4  causing spinal stenosis and cord deformity. This could be a cause of right arm weakness. Consider cervical spine MRI for follow-up. The patient may need sedation as she was not able to complete the MRI of the brain   Electronically Signed   By: Marlan Palau M.D.   On: 08/15/2014 20:46   Mr Cervical Spine Wo Contrast  08/16/2014   CLINICAL DATA:  Initial evaluation for cervical spinal cord compression.  EXAM: MRI CERVICAL SPINE WITHOUT CONTRAST  TECHNIQUE: Multiplanar, multisequence MR imaging of the cervical spine was performed. No intravenous contrast was administered.  COMPARISON:  Prior brain MRI from 08/15/2014  FINDINGS: Chronic small vessel ischemic type changes present within the pons. Visualized portions of the brain and posterior fossa are grossly unremarkable. Craniocervical junction widely patent.  Reversal of the normal cervical lordosis with apex at C4-5. There is 3 mm anterolisthesis of C4 on C5, likely chronic. Trace anterolisthesis of C7 on T1. Vertebral body heights maintained. Signal intensity within the vertebral body bone marrow within normal limits.  Signal intensity within the cervical spinal cord is normal.  Paraspinous soft tissues within normal  limits. Normal intravascular flow voids within the vertebral arteries bilaterally.  C2-C3: Mild posterior disc bulge with bilateral uncovertebral hypertrophy. There is resultant partial effacement of the ventral thecal sac with mild canal stenosis. Left greater than right facet arthrosis. There is moderate left with mild right foraminal narrowing.  C3-C4: Degenerative endplate changes with intervertebral disc space narrowing. Diffuse disc bulge with bilateral uncovertebral spurring and facet arthrosis, worse on the left. Ligamentum flavum thickening. There is resultant severe canal stenosis with the thecal sac measuring 4 mm in AP diameter. Flattening of the cervical spinal cord without cord signal changes. Severe bilateral foraminal stenosis  present as well, left worse than right.  C4-C5: 3 mm anterolisthesis of C4 on C5. Associated diffuse degenerative disc osteophyte with bilateral uncovertebral hypertrophy and facet arthrosis, right worse than left. Posterior disc osteophyte flattens and indents the ventral thecal sac results in mild canal stenosis. Fairly severe bilateral foraminal narrowing, left worse than right.  C5-C6: Diffuse broad-based degenerative disc osteophyte with bilateral car vertebral hypertrophy. Superimposed bilateral facet arthrosis, left greater than right. Posterior disc osteophyte flattens and effaces the ventral thecal sac and results in mild to moderate canal stenosis. There is severe left with moderate to severe right foraminal narrowing.  C6-C7: Diffuse degenerative disc osteophyte with left greater than right uncovertebral hypertrophy. Superimposed facet hypertrophy present as well with mild ligamentum flavum thickening. There is very mild canal narrowing at this level. Severe left with moderate right foraminal stenosis.  C7-T1: Trace anterolisthesis of C7 on T1. Mild diffuse disc bulge. No significant stenosis.  Visualized upper thoracic spine within normal limits.  IMPRESSION: 1. Advanced degenerative spondylolysis and facet arthrosis at C3-4 with resultant severe canal stenosis and flattening of the cervical spinal cord. No associated cord signal changes. Thecal sac measures 4 mm in AP diameter at this level. 2. Additional advanced multilevel degenerative spondylolysis with secondary canal and foraminal stenosis as detailed above. No other severe canal stenosis within the cervical spine.   Electronically Signed   By: Rise Mu M.D.   On: 08/16/2014 06:57   Echocardiogram Left ventricle: The cavity size was normal. There was mild focal basal hypertrophy of the septum. Doppler parameters are consistent with abnormal left ventricular relaxation (grade 1 diastolic dysfunction). There was no evidence of  elevated ventricular filling pressure by Doppler parameters. - Aortic valve: Trileaflet; mildly thickened, mildly calcified leaflets. There was no regurgitation. - Aortic root: The aortic root was normal in size. - Left atrium: The atrium was normal in size. - Right ventricle: Systolic function was normal. - Right atrium: The atrium was normal in size. - Tricuspid valve: There was moderate regurgitation. - Pulmonic valve: There was no regurgitation. - Pulmonary arteries: Systolic pressure was at the upper limits of normal. PA peak pressure: 38 mm Hg (S). - Inferior vena cava: The vessel was normal in size. - Pericardium, extracardiac: There was no pericardial effusion.  Impressions:  - There is akinesis in all of the apical segments consistent either stress induced cardiomyopathy (Tako-tsubo cardiomyopathy) or mid LAD disease. Overal LVEF 40%. No apical thrombus was seen. Scheduled Meds: . aspirin EC  325 mg Oral Daily  . atorvastatin  80 mg Oral q1800  . famotidine (PEPCID) IV  20 mg Intravenous Daily  . ferrous sulfate  325 mg Oral Q breakfast  . heparin  5,000 Units Subcutaneous 3 times per day  . metoprolol tartrate  25 mg Oral BID  . nitroGLYCERIN  0.5 inch Topical 4 times per day  .  sodium chloride  3 mL Intravenous Q12H  . vitamin B-12  50 mcg Oral Daily   Continuous Infusions:    Time spent: 15 minutes  Rajni Holsworth L  [facility]  [email or web address], password [facility] 08/17/2014, 10:05 AM  LOS: 2 days

## 2014-08-17 NOTE — Progress Notes (Signed)
CARDIAC REHAB PHASE I   PRE:  Rate/Rhythm: 63 SR  BP:  Supine:   Sitting: 134/54  Standing:    SaO2:   MODE:  Ambulation: 500 ft   POST:  Rate/Rhythm: 77 SR  BP:  Supine:   Sitting: 152/71  Standing:    SaO2:  0930-1020 On arrival pt in bed. Assisted X 1 to bathroom and then to ambulate. Gait a little unsteady at first but improved with distance. She was able to walk 500 feet without c/o of pain or SOB. VS stable. Pt to recliner after walk with call light in reach. Discussed Takot-subo MI with pt and gave her information. She admits to a lot of stress especially the last three years with losing a lot of family members and a lot of illnesses in the family. Pt states that the call her for everything. I did discuss CHF with pt and encouraged a low sodium diet.  Melina Copa RN 08/17/2014 10:19 AM

## 2014-08-18 DIAGNOSIS — I5181 Takotsubo syndrome: Secondary | ICD-10-CM

## 2014-08-18 DIAGNOSIS — M6289 Other specified disorders of muscle: Secondary | ICD-10-CM

## 2014-08-18 LAB — BASIC METABOLIC PANEL
Anion gap: 7 (ref 5–15)
BUN: 17 mg/dL (ref 6–20)
CO2: 25 mmol/L (ref 22–32)
CREATININE: 1.4 mg/dL — AB (ref 0.44–1.00)
Calcium: 8.7 mg/dL — ABNORMAL LOW (ref 8.9–10.3)
Chloride: 101 mmol/L (ref 101–111)
GFR calc non Af Amer: 32 mL/min — ABNORMAL LOW (ref >60)
GFR, EST AFRICAN AMERICAN: 37 mL/min — AB (ref >60)
Glucose, Bld: 101 mg/dL — ABNORMAL HIGH (ref 65–99)
POTASSIUM: 4.7 mmol/L (ref 3.5–5.1)
Sodium: 133 mmol/L — ABNORMAL LOW (ref 135–145)

## 2014-08-18 LAB — LIPID PANEL
CHOLESTEROL: 117 mg/dL (ref 0–200)
HDL: 32 mg/dL — ABNORMAL LOW (ref >40)
LDL CALC: 75 mg/dL (ref 0–99)
TRIGLYCERIDES: 52 mg/dL (ref <150)
Total CHOL/HDL Ratio: 3.7 RATIO
VLDL: 10 mg/dL (ref 0–40)

## 2014-08-18 MED ORDER — CARVEDILOL 6.25 MG PO TABS
6.2500 mg | ORAL_TABLET | Freq: Two times a day (BID) | ORAL | Status: DC
Start: 2014-08-18 — End: 2014-10-21

## 2014-08-18 MED ORDER — ASPIRIN 81 MG PO TBEC: 81.0000 mg | DELAYED_RELEASE_TABLET | Freq: Every day | ORAL | Status: DC

## 2014-08-18 NOTE — Clinical Social Work Note (Addendum)
CSW received notification from [facility] that the patient will require SNF placement at discharge. CSW unable to see patient today but will complete patient's FL2 and PASRR. Report left for weekday CSW.   UPDATE: CSW received call from [facility] stating patient no longer needs SNF placement. FL2 and PASRR already completed if plans change.    Roddie Mc, MSW, [place], [place] [phone]

## 2014-08-18 NOTE — Evaluation (Signed)
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 MRI brain negative, but MRI C-spine shows severe spinal stenosis with flattening of the cord. Complicating the case further, patient developed chest pain, elevated troponins and appears to have suffered an NSTEMI.    Clinical Impression  Pt presents at/near baseline functional mobility, is ambulatory without assistive device and denies history of falls.  Pt concerned about being alone at home at night, family can provide a short term solution but will need education and access to resources to plan for longer term solution, possibly independent or assistive living.  Please ask RNCM to discuss options with patient/family.  Recommend continue cardiac rehab, walking with nursing; consider HHPT referral to assess balance and mobility in home setting and perhaps to improve pt's confidence in that setting.  May benefit from [facility] ([place]) as well.  No other needs at this time, will sign off.  Thank you.    Follow Up Recommendations Home health PT;Supervision/Assistance - 24 hour    Equipment Recommendations  None recommended by PT - may need RW in future, but currently independent.   Recommendations for Other Services       Precautions / Restrictions Precautions Precautions: Fall Precaution Comments: use with supervision      Mobility  Bed Mobility Overal bed mobility: Independent                Transfers Overall transfer level: Independent Equipment used: None             General transfer comment: stands without hesitation or difficulty.  fingertip touch for stability initially  Ambulation/Gait Ambulation/Gait assistance:  Supervision Ambulation Distance (Feet): 75 Feet Assistive device: None Gait Pattern/deviations: Step-through pattern;Trunk flexed Gait velocity: 1.24 ft/sec Gait velocity interpretation: <1.8 ft/sec, indicative of risk for recurrent falls General Gait Details: slow but steady, short steps, stiff trunk rotation  Stairs            Wheelchair Mobility    Modified Rankin (Stroke Patients Only) Modified Rankin (Stroke Patients Only) Pre-Morbid Rankin Score: No symptoms Modified Rankin: No significant disability     Balance Overall balance assessment: Needs assistance Sitting-balance support: No upper extremity supported;Feet supported Sitting balance-Leahy Scale: Good     Standing balance support: No upper extremity supported;During functional activity Standing balance-Leahy Scale: Good                 High Level Balance Comments: Pt without history of slips, trips, stumbles or falls.  Based on gait speed prediction, age, and movement quality, high fall risk, would benefit from PT to address after d/c             Pertinent Vitals/Pain Pain Assessment: No/denies pain    Home Living Family/patient expects to be discharged to:: Private residence Living Arrangements: Alone Available Help at Discharge: Family;Available PRN/intermittently Type of Home: Apartment Home Access: Level entry     Home Layout: One level Home Equipment: Kasandra Knudsen - single point Additional Comments: lives in [place], brother drives her to [place], church, etc.  Niece and/or nephew can help with shortterm supervision needs    Prior Function Level of Independence: Independent               Hand Dominance        Extremity/Trunk  Assessment   Upper Extremity Assessment: Overall WFL for tasks assessed           Lower Extremity Assessment: Overall WFL for tasks assessed         Communication   Communication: No difficulties  Cognition Arousal/Alertness: Awake/alert Behavior During  Therapy: WFL for tasks assessed/performed Overall Cognitive Status: Within Functional Limits for tasks assessed                      General Comments      Exercises        Assessment/Plan    PT Assessment All further PT needs can be met in the next venue of care  PT Diagnosis Difficulty walking   PT Problem List Decreased balance;Decreased mobility  PT Treatment Interventions     PT Goals (Current goals can be found in the Care Plan section) Acute Rehab PT Goals Patient Stated Goal: feel safe at night PT Goal Formulation: All assessment and education complete, DC therapy    Frequency     Barriers to discharge        Co-evaluation               End of Session   Activity Tolerance: Patient tolerated treatment well Patient left: in bed;with call bell/phone within reach Nurse Communication: Mobility status         Time: 1034-1101 PT Time Calculation (min) (ACUTE ONLY): 27 min   Charges:   PT Evaluation $Initial PT Evaluation Tier I: 1 Procedure     PT G Codes:        Herbie Drape 08/18/2014, 11:09 AM

## 2014-08-18 NOTE — Progress Notes (Signed)
SUBJECTIVE:  The patient feels well today. She is ambulating.   Filed Vitals:   08/17/14 0804 08/17/14 1224 08/17/14 2100 08/18/14 0500  BP: 137/58 141/72 134/55 142/55  Pulse: 64  65 63  Temp: 99.5 F (37.5 C) 97.8 F (36.6 C) 97.4 F (36.3 C) 98.5 F (36.9 C)  TempSrc: Oral Oral    Resp: 18 18 16 13   Height:      Weight:    128 lb 9.6 oz (58.333 kg)  SpO2: 95% 97% 98% 93%     Intake/Output Summary (Last 24 hours) at 08/18/14 0844 Last data filed at 08/17/14 2100  Gross per 24 hour  Intake    120 ml  Output      0 ml  Net    120 ml    LABS: Basic Metabolic Panel:  Recent Labs  69/62/95 0551 08/18/14 0345  NA 131* 133*  K 4.2 4.7  CL 99* 101  CO2 25 25  GLUCOSE 123* 101*  BUN 21* 17  CREATININE 1.29* 1.40*  CALCIUM 9.1 8.7*   Liver Function Tests:  Recent Labs  08/15/14 1807 08/16/14 0551  AST 32 19  ALT 11* 7*  ALKPHOS 65 52  BILITOT 1.5* 0.5  PROT 7.6 6.3*  ALBUMIN 3.9 2.9*   No results for input(s): LIPASE, AMYLASE in the last 72 hours. CBC:  Recent Labs  08/15/14 1807  08/16/14 0551 08/17/14 0305  WBC 5.3  --  6.4 6.9  NEUTROABS 3.4  --  5.2  --   HGB 10.7*  < > 9.7* 9.7*  HCT 32.8*  < > 29.4* 29.5*  MCV 87.2  --  85.5 85.8  PLT 141*  --  226 280  < > = values in this interval not displayed. Cardiac Enzymes:  Recent Labs  08/16/14 0208 08/16/14 0551 08/16/14 1110  CKTOTAL  --  49  --   CKMB  --  6.2*  --   TROPONINI 0.69* 1.01* 0.91*   BNP: Invalid input(s): POCBNP D-Dimer: No results for input(s): DDIMER in the last 72 hours. Hemoglobin A1C:  Recent Labs  08/16/14 0208  HGBA1C 5.4   Fasting Lipid Panel: No results for input(s): CHOL, HDL, LDLCALC, TRIG, CHOLHDL, LDLDIRECT in the last 72 hours. Thyroid Function Tests: No results for input(s): TSH, T4TOTAL, T3FREE, THYROIDAB in the last 72 hours.  Invalid input(s): FREET3  RADIOLOGY: Ct Head Wo Contrast  08/15/2014   CLINICAL DATA:  81 year old female with  right hand weakness  EXAM: CT HEAD WITHOUT CONTRAST  TECHNIQUE: Contiguous axial images were obtained from the base of the skull through the vertex without intravenous contrast.  COMPARISON:  None.  FINDINGS: The ventricles are dilated and the sulci are prominent compatible with age-related atrophy. Periventricular and deep white matter hypodensities represent chronic microvascular ischemic changes. There is no intracranial hemorrhage. No mass effect or midline shift identified.  The visualized paranasal sinuses and mastoid air cells are well aerated. The calvarium is intact.  IMPRESSION: No acute intracranial pathology.  Age-related atrophy and chronic microvascular ischemic disease.  If symptoms persist and there are no contraindications, MRI may provide better evaluation if clinically indicated.   Electronically Signed   By: Elgie Collard M.D.   On: 08/15/2014 19:04   Mr Brain Wo Contrast (neuro Protocol)  08/15/2014   CLINICAL DATA:  Right arm weakness  EXAM: MRI HEAD WITHOUT CONTRAST  TECHNIQUE: Multiplanar, multiecho pulse sequences of the brain and surrounding structures were obtained without intravenous contrast.  COMPARISON:  CT head 08/15/2014  FINDINGS: Patient was claustrophobic and was not able to complete the study however the majority of the study was completed including diffusion-weighted imaging and axial T2 and FLAIR imaging. No contrast was administered.  Age-appropriate atrophy.  Negative for hydrocephalus.  Negative for acute infarct. Moderate chronic microvascular ischemic changes in the white matter and pons.  Marked disc degeneration and spurring at C3-4 with bone marrow edema. Posterior osteophyte formation is causing cord deformity and spinal stenosis. Possible mild cord compression.  Paranasal sinuses are clear. Left mastoid sinus effusion. Right mastoid sinus clear.  IMPRESSION: Atrophy and chronic microvascular ischemia.  No acute infarct  Marked disc degeneration and spurring at  C3-4 causing spinal stenosis and cord deformity. This could be a cause of right arm weakness. Consider cervical spine MRI for follow-up. The patient may need sedation as she was not able to complete the MRI of the brain   Electronically Signed   By: Marlan Palau M.D.   On: 08/15/2014 20:46   Mr Cervical Spine Wo Contrast  08/16/2014   CLINICAL DATA:  Initial evaluation for cervical spinal cord compression.  EXAM: MRI CERVICAL SPINE WITHOUT CONTRAST  TECHNIQUE: Multiplanar, multisequence MR imaging of the cervical spine was performed. No intravenous contrast was administered.  COMPARISON:  Prior brain MRI from 08/15/2014  FINDINGS: Chronic small vessel ischemic type changes present within the pons. Visualized portions of the brain and posterior fossa are grossly unremarkable. Craniocervical junction widely patent.  Reversal of the normal cervical lordosis with apex at C4-5. There is 3 mm anterolisthesis of C4 on C5, likely chronic. Trace anterolisthesis of C7 on T1. Vertebral body heights maintained. Signal intensity within the vertebral body bone marrow within normal limits.  Signal intensity within the cervical spinal cord is normal.  Paraspinous soft tissues within normal limits. Normal intravascular flow voids within the vertebral arteries bilaterally.  C2-C3: Mild posterior disc bulge with bilateral uncovertebral hypertrophy. There is resultant partial effacement of the ventral thecal sac with mild canal stenosis. Left greater than right facet arthrosis. There is moderate left with mild right foraminal narrowing.  C3-C4: Degenerative endplate changes with intervertebral disc space narrowing. Diffuse disc bulge with bilateral uncovertebral spurring and facet arthrosis, worse on the left. Ligamentum flavum thickening. There is resultant severe canal stenosis with the thecal sac measuring 4 mm in AP diameter. Flattening of the cervical spinal cord without cord signal changes. Severe bilateral foraminal  stenosis present as well, left worse than right.  C4-C5: 3 mm anterolisthesis of C4 on C5. Associated diffuse degenerative disc osteophyte with bilateral uncovertebral hypertrophy and facet arthrosis, right worse than left. Posterior disc osteophyte flattens and indents the ventral thecal sac results in mild canal stenosis. Fairly severe bilateral foraminal narrowing, left worse than right.  C5-C6: Diffuse broad-based degenerative disc osteophyte with bilateral car vertebral hypertrophy. Superimposed bilateral facet arthrosis, left greater than right. Posterior disc osteophyte flattens and effaces the ventral thecal sac and results in mild to moderate canal stenosis. There is severe left with moderate to severe right foraminal narrowing.  C6-C7: Diffuse degenerative disc osteophyte with left greater than right uncovertebral hypertrophy. Superimposed facet hypertrophy present as well with mild ligamentum flavum thickening. There is very mild canal narrowing at this level. Severe left with moderate right foraminal stenosis.  C7-T1: Trace anterolisthesis of C7 on T1. Mild diffuse disc bulge. No significant stenosis.  Visualized upper thoracic spine within normal limits.  IMPRESSION: 1. Advanced degenerative spondylolysis and facet arthrosis at C3-4  with resultant severe canal stenosis and flattening of the cervical spinal cord. No associated cord signal changes. Thecal sac measures 4 mm in AP diameter at this level. 2. Additional advanced multilevel degenerative spondylolysis with secondary canal and foraminal stenosis as detailed above. No other severe canal stenosis within the cervical spine.   Electronically Signed   By: Rise Mu M.D.   On: 08/16/2014 06:57    PHYSICAL EXAM   patient is oriented to person time and place. Affect is normal. Lungs were clear. Respiratory effort is not labored. Cardiac exam reveals an S1 and S2. Abdomen is soft. There is no peripheral edema.     ASSESSMENT AND  PLAN:    NSTEMI (non-ST elevated myocardial infarction)   Takotsubo syndrome     The patient is quite stable. From the cardiac viewpoint, she can be discharged home when she is fully ambulatory and stable. Please see my complete note from yesterday.  Willa Rough 08/18/2014 8:44 AM

## 2014-08-18 NOTE — Discharge Summary (Signed)
Physician Discharge Summary  Alicia [name] ZOX:[id number] DOB: [birth year] DOA: 08/15/2014  PCP: [name], MD  Admit date: 08/15/2014 Discharge date: 08/18/2014  Time spent: greater than 30 minutes  Recommendations for Outpatient Follow-up:  1. Follow up fasting lipid panel. If LDL>70, consider starting statin 2. Home RN, PT arranged 3. Repeat echo per cardiology   Discharge Diagnoses:  Principal Problem:   Right hand weakness Active Problems:   Postinflammatory pulmonary fibrosis   AKI (acute kidney injury)   Anemia   Spinal stenosis in cervical region   NSTEMI (non-ST elevated myocardial infarction)   Takotsubo syndrome   Cervical spinal cord compression   Renal insufficiency   Discharge Condition: stable  Diet recommendation: heart healthy  Filed Weights   08/16/14 1602 08/17/14 0000 08/18/14 0500  Weight: 60.6 kg (133 lb 9.6 oz) 60.8 kg (134 lb 0.6 oz) 58.333 kg (128 lb 9.6 oz)    History of present illness:  81 y.o. female with history of pulmonary fibrosis anemia hyperlipidemia HTN presents with weakness. Patient states that she was watching television and noted that her right arm went numb. She states it went down to her hand also. She states that her hand went into a spasm. Associated with this she had a pain in her side of her head. She states that everything has almost completely resolved. Patient has no headache noted. She has no dizziness. She did not have any syncope. She had no chest pain. She has no weakness in her legs. She states that she has general arthritis so it is hard for her to get around. She has no SOB noted no cough or congestion.  Hospital Course:   Admitted to telemetry  Right hand weakness: Resolved. MRI brain negative, but MRI C spine shows spinal stenosis with cord flattening.  Discussed with Dr. [name]. As patient's symptoms have resolved, she does not require surgery at this time. Should patient become symptomatic again down the  line, may consider surgery at that point in the future if fails conservative management.   NSTEMI (non-ST elevated myocardial infarction): developed chest pain after admission.  troponins increased to 1.01. Cardiac catheterization without significant CAD Echocardiogram shows likely Takatsubo cardiomyopathy. No ACE secondary to renal insufficiency. started on beta blocker. ASA. Will need repeat echo as outpatient   AKI (acute kidney injury) stable   Anemia of chronic disease. No evidence of bleeding currently.   Procedures:  Cardiac catheterization  Consultations:  cardiology  Discharge Exam: Filed Vitals:   08/18/14 0500  BP: 142/55  Pulse: 63  Temp: 98.5 F (36.9 C)  Resp: 13   See progress note  Discharge Instructions   Discharge Instructions    Diet - low sodium heart healthy    Complete by:  As directed      Increase activity slowly    Complete by:  As directed           Current Discharge Medication List    START taking these medications   Details  aspirin EC 81 MG EC tablet Take 1 tablet (81 mg total) by mouth daily.    carvedilol (COREG) 6.25 MG tablet Take 1 tablet (6.25 mg total) by mouth 2 (two) times daily with a meal. Qty: 60 tablet, Refills: 1      CONTINUE these medications which have NOT CHANGED   Details  Cyanocobalamin (VITAMIN B 12 PO) Take 1 tablet by mouth daily.    folic acid (FOLVITE) 1 MG tablet Take 1 mg by mouth daily.  furosemide (LASIX) 20 MG tablet Take 20 mg by mouth every other day.     Iron TABS Take 1 tablet by mouth daily.    Multiple Vitamin (MULTIVITAMIN WITH MINERALS) TABS tablet Take 1 tablet by mouth daily.    potassium chloride (K-DUR) 10 MEQ tablet Take 10 mEq by mouth every other day.     famotidine (PEPCID) 20 MG tablet Take 1 tablet (20 mg total) by mouth at bedtime. Qty: 30 tablet, Refills: 11    pantoprazole (PROTONIX) 40 MG tablet Take 1 tablet (40 mg total) by mouth daily. Take 30-60 min before  first meal of the day Qty: 30 tablet, Refills: 11   Associated Diagnoses: Upper airway cough syndrome       Allergies  Allergen Reactions  . Tylenol 8 Hour [Acetaminophen]     GI Upset  . Penicillins Rash   Follow-up Information    Schedule an appointment as soon as possible for a visit with Peter Swaziland, MD.   Specialty:  Cardiology   Contact information:   [address] [place] [address] [phone]       Follow up with [name], MD In 2 weeks.   Specialty:  Internal Medicine   Contact information:   [address] [place] [address] [phone]        The results of significant diagnostics from this hospitalization (including imaging, microbiology, ancillary and laboratory) are listed below for reference.    Significant Diagnostic Studies: Ct Head Wo Contrast  08/15/2014   CLINICAL DATA:  81 year old female with right hand weakness  EXAM: CT HEAD WITHOUT CONTRAST  TECHNIQUE: Contiguous axial images were obtained from the base of the skull through the vertex without intravenous contrast.  COMPARISON:  None.  FINDINGS: The ventricles are dilated and the sulci are prominent compatible with age-related atrophy. Periventricular and deep white matter hypodensities represent chronic microvascular ischemic changes. There is no intracranial hemorrhage. No mass effect or midline shift identified.  The visualized paranasal sinuses and mastoid air cells are well aerated. The calvarium is intact.  IMPRESSION: No acute intracranial pathology.  Age-related atrophy and chronic microvascular ischemic disease.  If symptoms persist and there are no contraindications, MRI may provide better evaluation if clinically indicated.   Electronically Signed   By: Elgie Collard M.D.   On: 08/15/2014 19:04   Mr Brain Wo Contrast (neuro Protocol)  08/15/2014   CLINICAL DATA:  Right arm weakness  EXAM: MRI HEAD WITHOUT CONTRAST  TECHNIQUE: Multiplanar, multiecho  pulse sequences of the brain and surrounding structures were obtained without intravenous contrast.  COMPARISON:  CT head 08/15/2014  FINDINGS: Patient was claustrophobic and was not able to complete the study however the majority of the study was completed including diffusion-weighted imaging and axial T2 and FLAIR imaging. No contrast was administered.  Age-appropriate atrophy.  Negative for hydrocephalus.  Negative for acute infarct. Moderate chronic microvascular ischemic changes in the white matter and pons.  Marked disc degeneration and spurring at C3-4 with bone marrow edema. Posterior osteophyte formation is causing cord deformity and spinal stenosis. Possible mild cord compression.  Paranasal sinuses are clear. Left mastoid sinus effusion. Right mastoid sinus clear.  IMPRESSION: Atrophy and chronic microvascular ischemia.  No acute infarct  Marked disc degeneration and spurring at C3-4 causing spinal stenosis and cord deformity. This could be a cause of right arm weakness. Consider cervical spine MRI for follow-up. The patient may need sedation as she was not able to complete  the MRI of the brain   Electronically Signed   By: Marlan Palau M.D.   On: 08/15/2014 20:46   Mr Cervical Spine Wo Contrast  08/16/2014   CLINICAL DATA:  Initial evaluation for cervical spinal cord compression.  EXAM: MRI CERVICAL SPINE WITHOUT CONTRAST  TECHNIQUE: Multiplanar, multisequence MR imaging of the cervical spine was performed. No intravenous contrast was administered.  COMPARISON:  Prior brain MRI from 08/15/2014  FINDINGS: Chronic small vessel ischemic type changes present within the pons. Visualized portions of the brain and posterior fossa are grossly unremarkable. Craniocervical junction widely patent.  Reversal of the normal cervical lordosis with apex at C4-5. There is 3 mm anterolisthesis of C4 on C5, likely chronic. Trace anterolisthesis of C7 on T1. Vertebral body heights maintained. Signal intensity within  the vertebral body bone marrow within normal limits.  Signal intensity within the cervical spinal cord is normal.  Paraspinous soft tissues within normal limits. Normal intravascular flow voids within the vertebral arteries bilaterally.  C2-C3: Mild posterior disc bulge with bilateral uncovertebral hypertrophy. There is resultant partial effacement of the ventral thecal sac with mild canal stenosis. Left greater than right facet arthrosis. There is moderate left with mild right foraminal narrowing.  C3-C4: Degenerative endplate changes with intervertebral disc space narrowing. Diffuse disc bulge with bilateral uncovertebral spurring and facet arthrosis, worse on the left. Ligamentum flavum thickening. There is resultant severe canal stenosis with the thecal sac measuring 4 mm in AP diameter. Flattening of the cervical spinal cord without cord signal changes. Severe bilateral foraminal stenosis present as well, left worse than right.  C4-C5: 3 mm anterolisthesis of C4 on C5. Associated diffuse degenerative disc osteophyte with bilateral uncovertebral hypertrophy and facet arthrosis, right worse than left. Posterior disc osteophyte flattens and indents the ventral thecal sac results in mild canal stenosis. Fairly severe bilateral foraminal narrowing, left worse than right.  C5-C6: Diffuse broad-based degenerative disc osteophyte with bilateral car vertebral hypertrophy. Superimposed bilateral facet arthrosis, left greater than right. Posterior disc osteophyte flattens and effaces the ventral thecal sac and results in mild to moderate canal stenosis. There is severe left with moderate to severe right foraminal narrowing.  C6-C7: Diffuse degenerative disc osteophyte with left greater than right uncovertebral hypertrophy. Superimposed facet hypertrophy present as well with mild ligamentum flavum thickening. There is very mild canal narrowing at this level. Severe left with moderate right foraminal stenosis.  C7-T1: Trace  anterolisthesis of C7 on T1. Mild diffuse disc bulge. No significant stenosis.  Visualized upper thoracic spine within normal limits.  IMPRESSION: 1. Advanced degenerative spondylolysis and facet arthrosis at C3-4 with resultant severe canal stenosis and flattening of the cervical spinal cord. No associated cord signal changes. Thecal sac measures 4 mm in AP diameter at this level. 2. Additional advanced multilevel degenerative spondylolysis with secondary canal and foraminal stenosis as detailed above. No other severe canal stenosis within the cervical spine.   Electronically Signed   By: Rise Mu M.D.   On: 08/16/2014 06:57   Echo Left ventricle: The cavity size was normal. There was mild focal basal hypertrophy of the septum. Doppler parameters are consistent with abnormal left ventricular relaxation (grade 1 diastolic dysfunction). There was no evidence of elevated ventricular filling pressure by Doppler parameters. - Aortic valve: Trileaflet; mildly thickened, mildly calcified leaflets. There was no regurgitation. - Aortic root: The aortic root was normal in size. - Left atrium: The atrium was normal in size. - Right ventricle: Systolic function was normal. - Right  atrium: The atrium was normal in size. - Tricuspid valve: There was moderate regurgitation. - Pulmonic valve: There was no regurgitation. - Pulmonary arteries: Systolic pressure was at the upper limits of normal. PA peak pressure: 38 mm Hg (S). - Inferior vena cava: The vessel was normal in size. - Pericardium, extracardiac: There was no pericardial effusion.  Impressions:  - There is akinesis in all of the apical segments consistent either stress induced cardiomyopathy (Tako-tsubo cardiomyopathy) or mid LAD disease. Overal LVEF 40%. No apical thrombus was seen.  Carotid doppler The vertebral arteries appear patent with antegrade flow. - Findings consistent with 1-39 percent stenosis involving  the right internal carotid artery and the left internal carotid artery.  Microbiology: No results found for this or any previous visit (from the past 240 hour(s)).   Labs: Basic Metabolic Panel:  Recent Labs Lab 08/15/14 1807 08/15/14 1857 08/16/14 0208 08/16/14 0551 08/18/14 0345  NA 131* 132*  --  131* 133*  K 5.1  6.2* 5.0  --  4.2 4.7  CL 98* 97*  --  99* 101  CO2 28  --   --  25 25  GLUCOSE 90 82  --  123* 101*  BUN 28* 27*  --  21* 17  CREATININE 1.45* 1.40* 1.35* 1.29* 1.40*  CALCIUM 9.5  --   --  9.1 8.7*   Liver Function Tests:  Recent Labs Lab 08/15/14 1807 08/16/14 0551  AST 32 19  ALT 11* 7*  ALKPHOS 65 52  BILITOT 1.5* 0.5  PROT 7.6 6.3*  ALBUMIN 3.9 2.9*   No results for input(s): LIPASE, AMYLASE in the last 168 hours. No results for input(s): AMMONIA in the last 168 hours. CBC:  Recent Labs Lab 08/15/14 1807 08/15/14 1857 08/16/14 0551 08/17/14 0305  WBC 5.3  --  6.4 6.9  NEUTROABS 3.4  --  5.2  --   HGB 10.7* 11.9* 9.7* 9.7*  HCT 32.8* 35.0* 29.4* 29.5*  MCV 87.2  --  85.5 85.8  PLT 141*  --  226 280   Cardiac Enzymes:  Recent Labs Lab 08/16/14 0208 08/16/14 0551 08/16/14 1110  CKTOTAL  --  49  --   CKMB  --  6.2*  --   TROPONINI 0.69* 1.01* 0.91*   BNP: BNP (last 3 results) No results for input(s): BNP in the last 8760 hours.  ProBNP (last 3 results)  Recent Labs  04/15/14 1456  PROBNP 131.0*    CBG: No results for input(s): GLUCAP in the last 168 hours.     SignedChristiane Ha  [facility] 08/18/2014, 10:08 AM

## 2014-08-18 NOTE — Progress Notes (Addendum)
[facility] PROGRESS NOTE  Alicia [name] ZOX:[id number] DOB: [birth year] DOA: 08/15/2014 PCP: [name], MD  Summary Chart reviewed. D/w Dr. Swaziland. 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 MRI brain negative, but MRI C-spine shows severe spinal stenosis with flattening of the cord.  Complicating the case further, patient developed chest pain, elevated troponins and appears to have suffered an NSTEMI.   Assessment/Plan:  Principal Problem:   Right hand weakness:  Resolved.  Discussed with Dr. Gerlene [name].  As patient's symptoms have resolved, she does not require surgery at this time. Should patient become symptomatic again down the line, may consider surgery at that point in the future if fails conservative management. Active Problems:   NSTEMI (non-ST elevated myocardial infarction):  Cardiac catheterization without significant CAD Echocardiogram shows likely Takatsubo cardiomyopathy. No ACE secondary to renal insufficiency.  On metoprolol   Postinflammatory pulmonary fibrosis stable   AKI (acute kidney injury) stable   Anemia of chronic disease. No evidence of bleeding currently.   Spinal stenosis in cervical region:  See above.   Subjective: No chest pain or dyspnea. No return of right hand weakness.  Objective: Filed Vitals:   08/18/14 1614  BP: 132/56  Pulse: 76  Temp: 98.2 F (36.8 C)  Resp: 18    Intake/Output Summary (Last 24 hours) at 08/18/14 1626 Last data filed at 08/17/14 2100  Gross per 24 hour  Intake    120 ml  Output      0 ml  Net    120 ml   Filed Weights   08/16/14 1602 08/17/14 0000 08/18/14 0500  Weight: 60.6 kg (133 lb 9.6 oz) 60.8 kg (134 lb 0.6 oz) 58.333 kg (128 lb 9.6 oz)    Exam:   General:  Walking with physical therapy. Oriented and appropriate.  Cardiovascular: Regular rate  rhythm without murmurs gallops rubs  Respiratory: Clear to auscultation bilaterally without wheezes rhonchi or rales  Abdomen: Soft nontender nondistended  Ext: No clubbing cyanosis or edema. Cath site at right wrist without abnormality  Neurologic: Cranial nerves intact. Motor strength 5 out of 5 throughout.  Basic Metabolic Panel:  Recent Labs Lab 08/15/14 1807 08/15/14 1857 08/16/14 0208 08/16/14 0551 08/18/14 0345  NA 131* 132*  --  131* 133*  K 5.1  6.2* 5.0  --  4.2 4.7  CL 98* 97*  --  99* 101  CO2 28  --   --  25 25  GLUCOSE 90 82  --  123* 101*  BUN 28* 27*  --  21* 17  CREATININE 1.45* 1.40* 1.35* 1.29* 1.40*  CALCIUM 9.5  --   --  9.1 8.7*   Liver Function Tests:  Recent Labs Lab 08/15/14 1807 08/16/14 0551  AST 32 19  ALT 11* 7*  ALKPHOS 65 52  BILITOT 1.5* 0.5  PROT 7.6 6.3*  ALBUMIN 3.9 2.9*   No results for input(s): LIPASE, AMYLASE in the last 168 hours. No results for input(s): AMMONIA in the last 168 hours. CBC:  Recent Labs Lab 08/15/14 1807 08/15/14 1857 08/16/14 0551 08/17/14 0305  WBC 5.3  --  6.4 6.9  NEUTROABS 3.4  --  5.2  --   HGB 10.7* 11.9* 9.7* 9.7*  HCT 32.8* 35.0* 29.4* 29.5*  MCV 87.2  --  85.5 85.8  PLT 141*  --  226 280   Cardiac Enzymes:  Recent Labs Lab 08/16/14 0208  08/16/14 0551 08/16/14 1110  CKTOTAL  --  49  --   CKMB  --  6.2*  --   TROPONINI 0.69* 1.01* 0.91*   BNP (last 3 results) No results for input(s): BNP in the last 8760 hours.  ProBNP (last 3 results)  Recent Labs  04/15/14 1456  PROBNP 131.0*   CBG: No results for input(s): GLUCAP in the last 168 hours.  No results found for this or any previous visit (from the past 240 hour(s)).   Studies: No results found. Echocardiogram Left ventricle: The cavity size was normal. There was mild focal basal hypertrophy of the septum. Doppler parameters are consistent with abnormal left ventricular relaxation (grade 1 diastolic  dysfunction). There was no evidence of elevated ventricular filling pressure by Doppler parameters. - Aortic valve: Trileaflet; mildly thickened, mildly calcified leaflets. There was no regurgitation. - Aortic root: The aortic root was normal in size. - Left atrium: The atrium was normal in size. - Right ventricle: Systolic function was normal. - Right atrium: The atrium was normal in size. - Tricuspid valve: There was moderate regurgitation. - Pulmonic valve: There was no regurgitation. - Pulmonary arteries: Systolic pressure was at the upper limits of normal. PA peak pressure: 38 mm Hg (S). - Inferior vena cava: The vessel was normal in size. - Pericardium, extracardiac: There was no pericardial effusion.  Impressions:  - There is akinesis in all of the apical segments consistent either stress induced cardiomyopathy (Tako-tsubo cardiomyopathy) or mid LAD disease. Overal LVEF 40%. No apical thrombus was seen. Scheduled Meds: . aspirin EC  325 mg Oral Daily  . atorvastatin  80 mg Oral q1800  . famotidine  20 mg Oral BID  . ferrous sulfate  325 mg Oral Q breakfast  . heparin  5,000 Units Subcutaneous 3 times per day  . metoprolol tartrate  25 mg Oral BID  . sodium chloride  3 mL Intravenous Q12H  . vitamin B-12  50 mcg Oral Daily   Continuous Infusions:    Time spent: 15 minutes  Lazara Grieser L  [facility]  [email or web address], password [facility] 08/18/2014, 4:26 PM  LOS: 3 days

## 2014-08-18 NOTE — Care Management (Signed)
CM received verbal referral from  pt's nurse for HHPT and pt being discharged today. CM reviewed chart  and consulted with pt,  pt's niece(Olivia), pt's nurse, charge nurse, MD,CSW,  regarding the following  PT's recommendation :  Home health PT;Supervision/Assistance - 24 hour.  It was determined pt would not meet medical need for SNF and pt did not have financial resources for ALF.   Per PT assessment note on 08/18/14 recommendation was based on pt's concern about being alone at home at night upon discharge. PT not recommendating any further PT while inpt.  Pt's family will coordinate someone to stay with pt @ hs at least 7 days. Spoke with pt niece Zollie Scale regarding options for additional supervision @ home and ALF if supervision became a pt safety issue. Pt's niece stated she had looked into [facility]F and is aware of the financial requirements. Pt set up with HHRN,PT, AIDE per MD'S order. Choice given to pt,  pt agreed  with [facility] services, stated has used services previously.Referral made with [facility](Tiffany) for HHRN/PT and aide. Gae Gallop RN,BSN,CM [phone]

## 2014-08-19 ENCOUNTER — Encounter (HOSPITAL_COMMUNITY): Payer: Self-pay | Admitting: Internal Medicine

## 2014-08-19 LAB — FOLATE RBC
Folate, RBC: >1962 ng/mL (ref >498)
HEMATOCRIT: 31.6 % — AB (ref 34.0–46.6)

## 2014-08-19 MED FILL — Heparin Sodium (Porcine) 2 Unit/ML in Sodium Chloride 0.9%: INTRAMUSCULAR | Qty: 1000 | Status: AC

## 2014-08-20 ENCOUNTER — Telehealth: Payer: Self-pay | Admitting: Cardiology

## 2014-08-20 ENCOUNTER — Encounter (HOSPITAL_COMMUNITY): Payer: Self-pay | Admitting: Emergency Medicine

## 2014-08-20 ENCOUNTER — Emergency Department (HOSPITAL_COMMUNITY)
Admission: EM | Admit: 2014-08-20 | Discharge: 2014-08-20 | Disposition: A | Discharge status: Home / Self Care | Payer: Medicare Other | Attending: Emergency Medicine | Admitting: Emergency Medicine

## 2014-08-20 ENCOUNTER — Emergency Department (HOSPITAL_COMMUNITY): Payer: Medicare Other

## 2014-08-20 DIAGNOSIS — D649 Anemia, unspecified: Secondary | ICD-10-CM | POA: Diagnosis not present

## 2014-08-20 DIAGNOSIS — Z87891 Personal history of nicotine dependence: Secondary | ICD-10-CM | POA: Insufficient documentation

## 2014-08-20 DIAGNOSIS — Z880 Allergy status to penicillin: Secondary | ICD-10-CM | POA: Diagnosis not present

## 2014-08-20 DIAGNOSIS — R110 Nausea: Secondary | ICD-10-CM | POA: Diagnosis not present

## 2014-08-20 DIAGNOSIS — I129 Hypertensive chronic kidney disease with stage 1 through stage 4 chronic kidney disease, or unspecified chronic kidney disease: Secondary | ICD-10-CM | POA: Diagnosis not present

## 2014-08-20 DIAGNOSIS — K5900 Constipation, unspecified: Secondary | ICD-10-CM | POA: Insufficient documentation

## 2014-08-20 DIAGNOSIS — R079 Chest pain, unspecified: Secondary | ICD-10-CM | POA: Diagnosis present

## 2014-08-20 DIAGNOSIS — R0789 Other chest pain: Secondary | ICD-10-CM | POA: Diagnosis not present

## 2014-08-20 DIAGNOSIS — M1990 Unspecified osteoarthritis, unspecified site: Secondary | ICD-10-CM | POA: Insufficient documentation

## 2014-08-20 DIAGNOSIS — Z8639 Personal history of other endocrine, nutritional and metabolic disease: Secondary | ICD-10-CM | POA: Insufficient documentation

## 2014-08-20 DIAGNOSIS — N189 Chronic kidney disease, unspecified: Secondary | ICD-10-CM | POA: Insufficient documentation

## 2014-08-20 DIAGNOSIS — Z8701 Personal history of pneumonia (recurrent): Secondary | ICD-10-CM | POA: Diagnosis not present

## 2014-08-20 DIAGNOSIS — Z79899 Other long term (current) drug therapy: Secondary | ICD-10-CM | POA: Diagnosis not present

## 2014-08-20 DIAGNOSIS — Z8709 Personal history of other diseases of the respiratory system: Secondary | ICD-10-CM | POA: Insufficient documentation

## 2014-08-20 LAB — CBC WITH DIFFERENTIAL/PLATELET
Basophils Absolute: 0 10*3/uL (ref 0.0–0.1)
Basophils Relative: 0 % (ref 0–1)
EOS ABS: 0.3 10*3/uL (ref 0.0–0.7)
EOS PCT: 3 % (ref 0–5)
HCT: 30 % — ABNORMAL LOW (ref 36.0–46.0)
HEMOGLOBIN: 9.9 g/dL — AB (ref 12.0–15.0)
LYMPHS PCT: 20 % (ref 12–46)
Lymphs Abs: 1.7 10*3/uL (ref 0.7–4.0)
MCH: 28.6 pg (ref 26.0–34.0)
MCHC: 33 g/dL (ref 30.0–36.0)
MCV: 86.7 fL (ref 78.0–100.0)
MONOS PCT: 8 % (ref 3–12)
Monocytes Absolute: 0.7 10*3/uL (ref 0.1–1.0)
NEUTROS ABS: 5.9 10*3/uL (ref 1.7–7.7)
Neutrophils Relative %: 69 % (ref 43–77)
Platelets: 246 10*3/uL (ref 150–400)
RBC: 3.46 MIL/uL — AB (ref 3.87–5.11)
RDW: 13.9 % (ref 11.5–15.5)
WBC: 8.7 10*3/uL (ref 4.0–10.5)

## 2014-08-20 LAB — I-STAT CHEM 8, ED
BUN: 25 mg/dL — AB (ref 6–20)
Calcium, Ion: 1.24 mmol/L (ref 1.13–1.30)
Chloride: 98 mmol/L — ABNORMAL LOW (ref 101–111)
Creatinine, Ser: 1.7 mg/dL — ABNORMAL HIGH (ref 0.44–1.00)
GLUCOSE: 95 mg/dL (ref 65–99)
HCT: 32 % — ABNORMAL LOW (ref 36.0–46.0)
Hemoglobin: 10.9 g/dL — ABNORMAL LOW (ref 12.0–15.0)
Potassium: 5 mmol/L (ref 3.5–5.1)
Sodium: 132 mmol/L — ABNORMAL LOW (ref 135–145)
TCO2: 23 mmol/L (ref 0–100)

## 2014-08-20 LAB — I-STAT TROPONIN, ED: TROPONIN I, POC: 0.1 ng/mL — AB (ref 0.00–0.08)

## 2014-08-20 MED ORDER — SODIUM CHLORIDE 0.9 % IV SOLN
Freq: Once | INTRAVENOUS | Status: AC
  Administered 2014-08-20: 22:00:00 via INTRAVENOUS

## 2014-08-20 NOTE — ED Notes (Signed)
Pt in [facility] from home. Pt recently d/c on Sunday for NSTEMI. Around 8pm pt had L sided CP, L jaw pain, L shoulder pain. Pt was also dizzy and nausea. Denies CP at present time. EKG en route good, NSR. Pt had NTG paste on L shoulder from Sunday that was removed by EMS. Pt took 324 ASA before EMS came.

## 2014-08-20 NOTE — Telephone Encounter (Signed)
Error

## 2014-08-20 NOTE — Telephone Encounter (Signed)
TCM   Per staff messages   Ms. Guttierrez will go home 7/24. She needs a 7 day TOC for CHF/Takotsubo Cardiomyopathy.   Thanks,   Thayer Ohm   TCM scheduled on 07/29 @ 10:30 with Tereso Newcomer

## 2014-08-20 NOTE — Discharge Instructions (Signed)
Chest Pain (Nonspecific) It is often hard to give a diagnosis for the cause of chest pain. There is always a chance that your pain could be related to something serious, such as a heart attack or a blood clot in the lungs. You need to follow up with your doctor. HOME CARE  If antibiotic medicine was given, take it as directed by your doctor. Finish the medicine even if you start to feel better.  For the next few days, avoid activities that bring on chest pain. Continue physical activities as told by your doctor.  Do not use any tobacco products. This includes cigarettes, chewing tobacco, and e-cigarettes.  Avoid drinking alcohol.  Only take medicine as told by your doctor.  Follow your doctor's suggestions for more testing if your chest pain does not go away.  Keep all doctor visits you made. GET HELP IF:  Your chest pain does not go away, even after treatment.  You have a rash with blisters on your chest.  You have a fever. GET HELP RIGHT AWAY IF:   You have more pain or pain that spreads to your arm, neck, jaw, back, or belly (abdomen).  You have shortness of breath.  You cough more than usual or cough up blood.  You have very bad back or belly pain.  You feel sick to your stomach (nauseous) or throw up (vomit).  You have very bad weakness.  You pass out (faint).  You have chills. This is an emergency. Do not wait to see if the problems will go away. Call your local emergency services (911 in [place] Do not drive yourself to the hospital. MAKE SURE YOU:   Understand these instructions.  Will watch your condition.  Will get help right away if you are not doing well or get worse. Document Released: 06/30/2007 Document Revised: 01/16/2013 Document Reviewed: 06/30/2007 [facility] Patient Information 2015 [place], [place]. This information is not intended to replace advice given to you by your health care provider. Make sure you discuss any questions you have with your  health care provider. Please make sure you keep your appointment is scheduled with your cardiologist and primary care physician on Friday

## 2014-08-20 NOTE — ED Notes (Signed)
EDP at bedside  

## 2014-08-20 NOTE — ED Provider Notes (Signed)
  Face-to-face evaluation   History:  Patient had neck pain related to her chest, left-sided, upper, for 20 minutes, earlier today. No recurrence. No associated symptoms. Recent hospitalization for toxic at [facility]. Cardiomyopathy with cardiac catheterization. She had bumped troponin, at 1.0.  Physical exam: Alert, elderly female in no apparent distress. Heart regular rate and rhythm, no murmur. Lungs clear anteriorly. Chest nontender to palpation.  Medical screening examination/treatment/procedure(s) were conducted as a shared visit with non-physician practitioner(s) and myself.  I personally evaluated the patient during the encounter  Mancel Bale, MD 08/23/14 [phone]

## 2014-08-20 NOTE — ED Provider Notes (Signed)
CSN: [id number]     Arrival date & time 08/20/14  2128 History   First MD Initiated Contact with Patient 08/20/14 2131     Chief Complaint  Patient presents with  . Chest Pain     (Consider location/radiation/quality/duration/timing/severity/associated sxs/prior Treatment) HPI Comments: Patient was just discharged hospital after suffering an MI.  She had a cardiac catheter with essentially normal.  Tonight after walking around her home and discussing power of attorney papers with her son, she developed left chest pain radiating to her left jaw and shoulder.  This lasted approximately 30 minutes and is totally resolved at this point, EMS did give her a 325 mg aspirin  Patient is a 81 y.o. female presenting with chest pain. The history is provided by the patient and the EMS personnel.  Chest Pain Pain location:  L chest Pain quality: tightness   Pain radiates to:  L jaw and L shoulder Pain radiates to the back: no   Pain severity:  No pain Onset quality:  Sudden Duration:  30 minutes Timing:  Intermittent Progression:  Resolved Chronicity:  Recurrent Relieved by:  Aspirin Worsened by:  Nothing tried Ineffective treatments:  None tried Associated symptoms: nausea   Associated symptoms: no dizziness, no fever, no headache, no shortness of breath and not vomiting   Risk factors: high cholesterol     Past Medical History  Diagnosis Date  . H/O Legionnaire's disease   . Pneumonia   . Anemia   . CKD (chronic kidney disease)   . Hyperlipidemia   . Arthritis   . Postinflammatory pulmonary fibrosis   . Upper airway cough syndrome   . Peripheral edema   . Chronic constipation   . Hypertension    Past Surgical History  Procedure Laterality Date  . Cholecystectomy    . Abdominal hysterectomy    . Cardiac catheterization N/A 08/16/2014    Procedure: Left Heart Cath and Coronary Angiography;  Surgeon: Dolores Patty, MD;  Location: [facility] INVASIVE CV LAB;  Service: Cardiovascular;   Laterality: N/A;   Family History  Problem Relation Age of Onset  . Lung cancer Brother     never smoker   History  Substance Use Topics  . Smoking status: Former Smoker -- 0.50 packs/day for 36 years    Types: Cigarettes    Quit date: 01/25/1977  . Smokeless tobacco: Never Used  . Alcohol Use: No   OB History    No data available     Review of Systems  Constitutional: Negative for fever.  Respiratory: Negative for shortness of breath.   Cardiovascular: Positive for chest pain. Negative for leg swelling.  Gastrointestinal: Positive for nausea. Negative for vomiting.  Neurological: Negative for dizziness and headaches.  All other systems reviewed and are negative.     Allergies  Tylenol 8 hour and Penicillins  Home Medications   Prior to Admission medications   Medication Sig Start Date End Date Taking? Authorizing Provider  aspirin EC 81 MG EC tablet Take 1 tablet (81 mg total) by mouth daily. 08/18/14   Christiane Ha, MD  carvedilol (COREG) 6.25 MG tablet Take 1 tablet (6.25 mg total) by mouth 2 (two) times daily with a meal. 08/18/14   Christiane Ha, MD  Cyanocobalamin (VITAMIN B 12 PO) Take 1 tablet by mouth daily.    Historical Provider, MD  famotidine (PEPCID) 20 MG tablet Take 1 tablet (20 mg total) by mouth at bedtime. Patient not taking: Reported on 08/15/2014 04/15/14  Nyoka Cowden, MD  folic acid (FOLVITE) 1 MG tablet Take 1 mg by mouth daily.    Historical Provider, MD  furosemide (LASIX) 20 MG tablet Take 20 mg by mouth every other day.     Historical Provider, MD  Iron TABS Take 1 tablet by mouth daily.    Historical Provider, MD  Multiple Vitamin (MULTIVITAMIN WITH MINERALS) TABS tablet Take 1 tablet by mouth daily.    Historical Provider, MD  pantoprazole (PROTONIX) 40 MG tablet Take 1 tablet (40 mg total) by mouth daily. Take 30-60 min before first meal of the day Patient not taking: Reported on 08/15/2014 04/15/14   Nyoka Cowden, MD  potassium  chloride (K-DUR) 10 MEQ tablet Take 10 mEq by mouth every other day.     Historical Provider, MD   BP 134/56 mmHg  Pulse 66  Temp(Src) 98.4 F (36.9 C) (Oral)  Resp 19  Ht  (1.626 m)  Wt 128 lb (58.06 kg)  BMI 21.96 kg/m2  SpO2 100% Physical Exam  Constitutional: She appears well-developed and well-nourished. No distress.  HENT:  Head: Normocephalic.  Eyes: Pupils are equal, round, and reactive to light.  Neck: Normal range of motion.  Cardiovascular: Normal rate and regular rhythm.   Pulmonary/Chest: Effort normal and breath sounds normal.  Abdominal: Soft. She exhibits no distension. There is no tenderness.  Musculoskeletal: Normal range of motion. She exhibits no edema.  Neurological: She is alert.  Skin: Skin is warm and dry.  Nursing note and vitals reviewed.   ED Course  Procedures (including critical care time) Labs Review Labs Reviewed  CBC WITH DIFFERENTIAL/PLATELET - Abnormal; Notable for the following:    RBC 3.46 (*)    Hemoglobin 9.9 (*)    HCT 30.0 (*)    All other components within normal limits  I-STAT CHEM 8, ED - Abnormal; Notable for the following:    Sodium 132 (*)    Chloride 98 (*)    BUN 25 (*)    Creatinine, Ser 1.70 (*)    Hemoglobin 10.9 (*)    HCT 32.0 (*)    All other components within normal limits  I-STAT TROPOININ, ED - Abnormal; Notable for the following:    Troponin i, poc 0.10 (*)    All other components within normal limits    Imaging Review Dg Chest Portable 1 View  08/20/2014   CLINICAL DATA:  Left neck   and chest pain, abdominal discomfort  EXAM: PORTABLE CHEST - 1 VIEW  COMPARISON:  09/25/2013  FINDINGS: Mild cardiomegaly. Atheromatous aortic arch. Linear scarring or subsegmental atelectasis in the left mid and lower lung. Right lung remains clear. No effusion.  No pneumothorax. Spondylitic changes in the mid and lower thoracic spine.  IMPRESSION: Mild cardiomegaly without acute or superimposed abnormality.    Electronically Signed   By: Corlis Leak M.D.   On: 08/20/2014 21:51     EKG Interpretation   Date/Time:  Tuesday August 20 2014 21:39:21 EDT Ventricular Rate:  69 PR Interval:  201 QRS Duration: 91 QT Interval:  403 QTC Calculation: 432 R Axis:   -29 Text Interpretation:  Sinus rhythm Borderline left axis deviation Abnormal  T, consider ischemia, diffuse leads since last tracing no significant  change Confirmed by Effie Shy  MD, ELLIOTT [phone]) on 08/20/2014 10:14:55 PM     Previous medical records have been reviewed.  Cardiac catheter showed no significant abnormality.  She was cleared for cataract surgery today.  Her EKG is  unchanged, her troponin is trending down.  She is no longer having any chest discomfort MDM   Final diagnoses:  Chest discomfort         Earley Favor, NP 08/20/14 2248  Mancel Bale, MD 08/23/14 [phone]

## 2014-08-20 NOTE — Telephone Encounter (Addendum)
Patient contacted regarding discharge from [facility] on 7/24  Pt has HH PT working with her at this time and would like call back later, as she has another therapist coming at 1pm.  Told pt she would get another call later today to talk through details of her medication. Pt agreed with plan.   Pt called back about 3:20pm.  Patient understands to follow up with provider ? 7/29 @ 10:30am with Wende Mott  Patient understands discharge instructions? Yes  Patient understands medications and regiment? Yes   Patient understands to bring all medications to this visit? Yes   Reviewed pt medications.  She stated her brother picked up her Coreg today, as pharmacy did not have it when they went yesterday, and she just took her first dose.  Pt stated she take Lasix 20 mg EVERY OTHER DAY and will take next dose tomorrow.  Pt stated she no longer take Pepcid just takes the 40 mg of Protonix before meals in the morning.  Spoke with pt son, Alicia [name], to verify they had our office and [facility] office number and that she understood the directions to of where her appointment was and not in Dr. Elvis Coil office.  Pt and son expressed understanding, no additional questions at this time.

## 2014-08-22 NOTE — Progress Notes (Signed)
Cardiology Office Note   Date:  08/23/2014   ID:  Alicia [name] 1922-12-23, MRN [id number]  PCP:  [name], MD  Cardiologist:  Dr. Peter [place]and   Electrophysiologist:  N/a   Chief Complaint  Patient presents with  . Hospitalization Follow-up    s/p NSTEMI >> Tako-Tsubo CM  . Coronary Artery Disease     History of Present Illness: Alicia [name] is a 81 y.o. female with a hx of pulmonary fibrosis, anemia, HL, HTN, tobacco abuse.    Admitted 7/21-7/24 with transient R arm weakness.  Brain MRI demonstrated no acute findings but cervical spine MRI demonstrated spinal stenosis with cord flattening.  No surgical intervention was required.  Hospital stay was c/b NSTEMI. She developed CP after admission.  Tn peaked at 1.01.  LHC demonstrated mild non-obstructive CAD.  Echo demonstrated apical AK c/s stress induced CM (Tako-Tsubo CM) with EF 40%.    Returned to ED 7/26 with CP.  CEs were trending down and ECG was unchanged.  She was reassured and DC to home.  She returns for FU.  She is here with her son who is visiting from [place]. She denies any chest discomfort. She denies significant dyspnea. She denies orthopnea, PND. She has chronic ankle edema without change. She denies syncope.   Studies/Reports Reviewed Today:  LHC 08/16/14 LAD: prox to mid 30% LCx: OM1 30%; OM3 30% RCA: prox 40%  Carotid US 07/2014 bilat ICA 1-39%  Echo 08/16/14 - Mild focal basal hypertrophy of the septum. Grade 1 diastolic dysfunction - Aortic valve: Trileaflet; mildly thickened, mildly calcifiedleaflets. There was no regurgitation. - Tricuspid valve: There was moderate regurgitation. - Pulmonary arteries: Systolic pressure was at the upper limits ofnormal. PA peak pressure: 38 mm Hg (S). Impressions:  - There is akinesis in all of the apical segments consistent eitherstress induced cardiomyopathy (Tako-tsubo cardiomyopathy) or midLAD disease. Overal LVEF 40%. No apical thrombus  was seen.   Past Medical History  Diagnosis Date  . H/O Legionnaire's disease   . Pneumonia   . Anemia   . CKD (chronic kidney disease)   . Hyperlipidemia   . Arthritis   . Postinflammatory pulmonary fibrosis   . Upper airway cough syndrome   . Peripheral edema   . Chronic constipation   . Hypertension   . Takotsubo cardiomyopathy 07/2014    NSTEMI 07/2014 >>   a. Echo 7/16:  apical AK c/w stress induced CM, EF 40%, no clot, mod TR, PASP 38 mmHg  . CAD (coronary artery disease)     a.  LHC 7/16:  prox to mid LAD 30, OM1 30, OM3 30, pRCA 40  . Carotid stenosis     a.  Carotid US 7/16:  bilat ICA 1-39%  . Cervical spinal stenosis     Past Surgical History  Procedure Laterality Date  . Cholecystectomy    . Abdominal hysterectomy    . Cardiac catheterization N/A 08/16/2014    Procedure: Left Heart Cath and Coronary Angiography;  Surgeon: Dolores Patty, MD;  Location: [facility] INVASIVE CV LAB;  Service: Cardiovascular;  Laterality: N/A;     Current Outpatient Prescriptions  Medication Sig Dispense Refill  . aspirin EC 81 MG EC tablet Take 1 tablet (81 mg total) by mouth daily.    . carvedilol (COREG) 6.25 MG tablet Take 1 tablet (6.25 mg total) by mouth 2 (two) times daily with a meal. 60 tablet 1  . Cyanocobalamin (VITAMIN B 12 PO) Take 1 tablet  by mouth daily.    . famotidine (PEPCID) 20 MG tablet Take 1 tablet (20 mg total) by mouth at bedtime. 30 tablet 11  . folic acid (FOLVITE) 1 MG tablet Take 1 mg by mouth daily.    . furosemide (LASIX) 20 MG tablet Take 20 mg by mouth every other day.     . Iron TABS Take 1 tablet by mouth daily.    . Multiple Vitamin (MULTIVITAMIN WITH MINERALS) TABS tablet Take 1 tablet by mouth daily.    . pantoprazole (PROTONIX) 40 MG tablet Take 1 tablet (40 mg total) by mouth daily. Take 30-60 min before first meal of the day 30 tablet 11  . potassium chloride (K-DUR) 10 MEQ tablet Take 10 mEq by mouth every other day.      No current  facility-administered medications for this visit.    Allergies:   Tylenol 8 hour and Penicillins    Social History:  The patient  reports that she quit smoking about 37 years ago. Her smoking use included Cigarettes. She has a 18 pack-year smoking history. She has never used smokeless tobacco. She reports that she does not drink alcohol or use illicit drugs.   Family History:  The patient's family history includes Cancer in her mother; Healthy in her brother, brother, father, and sister; Lung cancer in her brother, brother, and brother.    ROS:   Please see the history of present illness.   Review of Systems  Constitution: Positive for decreased appetite and malaise/fatigue.  Cardiovascular: Positive for leg swelling.  Musculoskeletal: Positive for joint swelling.  Psychiatric/Behavioral: The patient is nervous/anxious.   All other systems reviewed and are negative.     PHYSICAL EXAM: VS:  BP 110/60 mmHg  Pulse 65  Ht 5\' 4"  (1.626 m)  Wt 129 lb 1.9 oz (58.568 kg)  BMI 22.15 kg/m2  SpO2 96%    Wt Readings from Last 3 Encounters:  08/23/14 129 lb 1.9 oz (58.568 kg)  08/20/14 128 lb (58.06 kg)  08/18/14 128 lb 9.6 oz (58.333 kg)     GEN: Well nourished, well developed, in no acute distress HEENT: normal Neck: no JVD, no masses Cardiac:  Normal S1/S2, RRR; no murmur ,  no rubs or gallops, trace to 1+ bilateral ankle edema   Respiratory:  clear to auscultation bilaterally, no wheezing, rhonchi or rales. GI: soft, nontender, nondistended, + BS MS: no deformity or atrophy Skin: warm and dry  Neuro:  CNs II-XII intact, Strength and sensation are intact Psych: Normal affect   EKG:  EKG is not ordered today.  It demonstrates:   N/a ECG from ED 08/20/14:  NSR, HR 69, inf and ant-lat TWI   Recent Labs: 04/15/2014: Pro B Natriuretic peptide (BNP) 131.0*; TSH 3.16 08/16/2014: ALT 7* 08/20/2014: BUN 25*; Creatinine, Ser 1.70*; Hemoglobin 10.9*; Platelets 246; Potassium 5.0; Sodium  132*    Lipid Panel    Component Value Date/Time   CHOL 117 08/18/2014 1040   TRIG 52 08/18/2014 1040   HDL 32* 08/18/2014 1040   CHOLHDL 3.7 08/18/2014 1040   VLDL 10 08/18/2014 1040   LDLCALC 75 08/18/2014 1040      ASSESSMENT AND PLAN:  Takotsubo syndrome:  She had apical akinesis with an EF of 40% on echocardiogram in no significant CAD on cardiac catheterization. She has chronic kidney disease in her blood pressure is on the softer side. I'm not certain that she would be able to tolerate an ACE inhibitor. Continue beta blocker. Recheck  limited echo to assess her LV function in 6-8 weeks. I had a long discussion with the patient and her son regarding pathophysiology of her cardiomyopathy and the high likelihood that her ejection fraction will recover.  Coronary artery disease - Non-Obstructive by Cath 07/2014:  No significant CAD on cardiac catheterization. Continue aspirin, beta blocker.  Essential hypertension:  Controlled.  Hyperlipidemia:  Recent LDL optimal. At her advanced age, I am not convinced that statin therapy would provide much benefit. I discussed the potential benefits with her and her son today. She does not want to start on statin therapy.  CKD (chronic kidney disease), stage 3 (moderate):  Recent creatinine somewhat elevated. Repeat BMET today.  Spinal stenosis in cervical region:  Follow-up with primary care.:      Medication Changes: Current medicines are reviewed at length with the patient today.  Concerns regarding medicines are as outlined above.  The following changes have been made:   Discontinued Medications   No medications on file   Modified Medications   No medications on file   New Prescriptions   No medications on file    Labs/ tests ordered today include:   Orders Placed This Encounter  Procedures  . Basic Metabolic Panel (BMET)  . ECHO LIMITED     Disposition:   FU with me in 2 mos.    Signed, Brynda Rim,  MHS 08/23/2014 11:59 AM    [facility] [address] [place], [place], [place]  [address] Phone: [phone]; Fax: [phone]

## 2014-08-23 ENCOUNTER — Encounter: Payer: Self-pay | Admitting: Physician Assistant

## 2014-08-23 ENCOUNTER — Ambulatory Visit (INDEPENDENT_AMBULATORY_CARE_PROVIDER_SITE_OTHER): Payer: Medicare Other | Admitting: Physician Assistant

## 2014-08-23 VITALS — BP 110/60 | HR 65 | Ht 64.0 in | Wt 129.1 lb

## 2014-08-23 DIAGNOSIS — E785 Hyperlipidemia, unspecified: Secondary | ICD-10-CM

## 2014-08-23 DIAGNOSIS — I2510 Atherosclerotic heart disease of native coronary artery without angina pectoris: Secondary | ICD-10-CM | POA: Diagnosis not present

## 2014-08-23 DIAGNOSIS — N183 Chronic kidney disease, stage 3 (moderate): Secondary | ICD-10-CM | POA: Diagnosis not present

## 2014-08-23 DIAGNOSIS — I10 Essential (primary) hypertension: Secondary | ICD-10-CM

## 2014-08-23 DIAGNOSIS — M4802 Spinal stenosis, cervical region: Secondary | ICD-10-CM

## 2014-08-23 DIAGNOSIS — I5181 Takotsubo syndrome: Secondary | ICD-10-CM

## 2014-08-23 LAB — BASIC METABOLIC PANEL
BUN: 26 mg/dL — AB (ref 6–23)
CHLORIDE: 105 meq/L (ref 96–112)
CO2: 25 mEq/L (ref 19–32)
Calcium: 9.5 mg/dL (ref 8.4–10.5)
Creatinine, Ser: 1.49 mg/dL — ABNORMAL HIGH (ref 0.40–1.20)
GFR: 42.31 mL/min — ABNORMAL LOW (ref >60.00)
Glucose, Bld: 88 mg/dL (ref 70–99)
POTASSIUM: 4.3 meq/L (ref 3.5–5.1)
SODIUM: 138 meq/L (ref 135–145)

## 2014-08-23 NOTE — Patient Instructions (Signed)
Medication Instructions:  Your physician recommends that you continue on your current medications as directed. Please refer to the Current Medication list given to you today.   Labwork: BMET  Will be done TODAY  Testing/Procedures: Your physician has requested that you have an limited echocardiogram. Echocardiography is a painless test that uses sound waves to create images of your heart. It provides your doctor with information about the size and shape of your heart and how well your heart's chambers and valves are working. This procedure takes approximately one hour. There are no restrictions for this procedure.    Follow-Up: You are scheduled for a follow up visit on 10-21-14 1:45 is your arrival time, to see Tereso Newcomer, PA-C  Any Other Special Instructions Will Be Listed Below (If Applicable).

## 2014-08-26 ENCOUNTER — Telehealth: Payer: Self-pay

## 2014-08-26 NOTE — Telephone Encounter (Signed)
spoke with patient about her recent lab results. patient verbalized understanding.

## 2014-10-09 ENCOUNTER — Other Ambulatory Visit: Payer: Self-pay

## 2014-10-09 ENCOUNTER — Ambulatory Visit (HOSPITAL_COMMUNITY): Payer: Medicare Other | Attending: Cardiology

## 2014-10-09 DIAGNOSIS — I2510 Atherosclerotic heart disease of native coronary artery without angina pectoris: Secondary | ICD-10-CM

## 2014-10-10 ENCOUNTER — Encounter: Payer: Self-pay | Admitting: Physician Assistant

## 2014-10-10 ENCOUNTER — Telehealth: Payer: Self-pay | Admitting: *Deleted

## 2014-10-10 NOTE — Telephone Encounter (Signed)
Pt notified of echo results by phone with verbal understanding 

## 2014-10-20 NOTE — Progress Notes (Signed)
Cardiology Office Note   Date:  10/21/2014   ID:  Alicia, [name] Alicia [name], MRN [id number]  PCP:  Alicia Housekeeper, MD  Cardiologist:  Dr. Peter Swaziland   Electrophysiologist:  N/a   Chief Complaint  Patient presents with  . Cardiomyopathy     History of Present Illness: Alicia [name] is a 81 y.o. female with a hx of pulmonary fibrosis, anemia, HL, HTN, tobacco abuse.    Admitted 07/2014 with transient R arm weakness.  Brain MRI demonstrated no acute findings but cervical spine MRI demonstrated spinal stenosis with cord flattening.  No surgical intervention was required.  Hospital stay was c/b NSTEMI. She developed CP after admission.  Tn peaked at 1.01.  LHC demonstrated mild non-obstructive CAD.  Echo demonstrated apical AK c/s stress induced CM (Tako-Tsubo CM) with EF 40%.    Returned to ED 7/26 with CP.  CEs were trending down and ECG was unchanged.  She was reassured and DC to home.  I saw her for FU 08/23/14.  Her BP remained soft and her creatinine was elevated recently.  I elected to not place her on an ACE inhibitor and set her up for FU Echo 10/09/14.  This demonstrated improved LVF with EF 55-60%.  She returns for FU.  She is here by herself today.  She is doing well.  She denies chest pain, dyspnea, syncope.  She has some R knee pain and sees her orthopedist later today.  She tells me that she stopped taking her Coreg in July (she only took 1-2 doses and stopped).  She also stopped the ASA b/c it upsets her stomach.    Studies/Reports Reviewed Today:  Echo 10/09/14 EF 55-60%, normal wall motion, grade 1 diastolic dysfunction, MAC, mild LAE, normal RV function, PASP 38 mmHg  LHC 08/16/14 LAD: prox to mid 30% LCx: OM1 30%; OM3 30% RCA: prox 40%  Carotid US 07/2014 bilat ICA 1-39%  Echo 08/16/14 Mild focal basal septal hypertrophy, grade 1 diastolic dysfunction, EF 40%, akinesis in all the apical segments-Tako-Tsubo cardiomyopathy pattern, moderate TR   Past  Medical History  Diagnosis Date  . H/O Legionnaire's disease   . Pneumonia   . Anemia   . CKD (chronic kidney disease)   . Hyperlipidemia   . Arthritis   . Postinflammatory pulmonary fibrosis   . Upper airway cough syndrome   . Peripheral edema   . Chronic constipation   . Hypertension   . Takotsubo cardiomyopathy 07/2014    NSTEMI 07/2014 >>   a. Echo 7/16:  apical AK c/w stress induced CM, EF 40%, no clot, mod TR, PASP 38 mmHg;  b. Limited echo 9/16: EF 55-60%, normal wall motion, grade 1 diastolic dysfunction, MAC, mild LAE, normal RV function, mild RAE, PASP 38  . CAD (coronary artery disease)     a.  LHC 7/16:  prox to mid LAD 30, OM1 30, OM3 30, pRCA 40  . Carotid stenosis     a.  Carotid US 7/16:  bilat ICA 1-39%  . Cervical spinal stenosis     Past Surgical History  Procedure Laterality Date  . Cholecystectomy    . Abdominal hysterectomy    . Cardiac catheterization N/A 08/16/2014    Procedure: Left Heart Cath and Coronary Angiography;  Surgeon: Dolores Patty, MD;  Location: [facility] INVASIVE CV LAB;  Service: Cardiovascular;  Laterality: N/A;     Current Outpatient Prescriptions  Medication Sig Dispense Refill  . Cyanocobalamin (VITAMIN B 12 PO) Take  1 tablet by mouth daily.    . famotidine (PEPCID) 20 MG tablet Take 1 tablet (20 mg total) by mouth at bedtime. 30 tablet 11  . folic acid (FOLVITE) 1 MG tablet Take 1 mg by mouth daily.    . furosemide (LASIX) 20 MG tablet Take 20 mg by mouth every other day.     . Iron TABS Take 1 tablet by mouth daily.    . Multiple Vitamin (MULTIVITAMIN WITH MINERALS) TABS tablet Take 1 tablet by mouth daily.    . pantoprazole (PROTONIX) 40 MG tablet Take 1 tablet (40 mg total) by mouth daily. Take 30-60 min before first meal of the day 30 tablet 11  . potassium chloride (K-DUR) 10 MEQ tablet Take 10 mEq by mouth every other day.      No current facility-administered medications for this visit.    Allergies:   Tylenol 8 hour and  Penicillins    Social History:  The patient  reports that she quit smoking about 37 years ago. Her smoking use included Cigarettes. She has a 18 pack-year smoking history. She has never used smokeless tobacco. She reports that she does not drink alcohol or use illicit drugs.   Family History:  The patient's family history includes Cancer in her mother; Healthy in her brother, brother, father, and sister; Lung cancer in her brother, brother, and brother.    ROS:   Please see the history of present illness.   Review of Systems  All other systems reviewed and are negative.     PHYSICAL EXAM: VS:  BP 118/50 mmHg  Pulse 77  Ht  (1.626 m)  Wt 129 lb 6.4 oz (58.695 kg)  BMI 22.20 kg/m2    Wt Readings from Last 3 Encounters:  10/21/14 129 lb 6.4 oz (58.695 kg)  08/23/14 129 lb 1.9 oz (58.568 kg)  08/20/14 128 lb (58.06 kg)     GEN: Well nourished, well developed, in no acute distress HEENT: normal Neck: no JVD, no masses Cardiac:  Normal S1/S2, RRR; no murmur ,  no rubs or gallops, trace to 1+ bilateral ankle edema   Respiratory:  clear to auscultation bilaterally, no wheezing, rhonchi or rales. GI: soft, nontender, nondistended, + BS MS: no deformity or atrophy Skin: warm and dry  Neuro:  CNs II-XII intact, Strength and sensation are intact Psych: Normal affect   EKG:  EKG is ordered today.  It demonstrates:   NSR, HR 77, normal axis, no ST changes (TWI resolved)   Recent Labs: 04/15/2014: Pro B Natriuretic peptide (BNP) 131.0*; TSH 3.16 08/16/2014: ALT 7* 08/20/2014: Hemoglobin 10.9*; Platelets 246 08/23/2014: BUN 26*; Creatinine, Ser 1.49*; Potassium 4.3; Sodium 138    Lipid Panel    Component Value Date/Time   CHOL 117 08/18/2014 1040   TRIG 52 08/18/2014 1040   HDL 32* 08/18/2014 1040   CHOLHDL 3.7 08/18/2014 1040   VLDL 10 08/18/2014 1040   LDLCALC 75 08/18/2014 1040      ASSESSMENT AND PLAN:  1. Takotsubo syndrome:  She had apical akinesis with an EF of  40% on echocardiogram and no significant CAD on cardiac catheterization 7/16 c/w stress induced CM.  Recent echo demonstrates normal LVF with no RWMA.  She did not tolerate beta-blocker therapy and he EF recovered without it.  Her BP is normal.  I would not recommend trying to start a different beta-blocker as there has not been clear data to support a survival benefit with beta-blockers.  There have been  some reports that suggest improved 1 year survival with ACE inhibitors or angiotensin receptor blockers.  I have been hesitant to start these in the past given her renal function and advanced age.  At this time, I do not think there is much benefit to starting an ACE inhibitor in this patient.  2. Coronary artery disease - Non-Obstructive by Cath 07/2014:  No significant CAD on cardiac catheterization. She has been somewhat intolerant of ASA in the past.  I have asked her to try taking the ASA 2-3 days a week once her acid reflux symptoms are improved.    3. Essential hypertension:  Controlled.  4. Hyperlipidemia:  Recent LDL optimal. At her advanced age, I am not convinced that statin therapy would provide much benefit. I discussed the potential benefits with her and her son previously. She has not wanted to start on statin therapy.  5. CKD (chronic kidney disease), stage 3 (moderate):   Creatinine 1.49 in 7/16.  Creatinine clearance is 23.6.    6. Spinal stenosis in cervical region:  Follow-up with primary care.     Medication Changes: Current medicines are reviewed at length with the patient today.  Concerns regarding medicines are as outlined above.  The following changes have been made:   Discontinued Medications   ASPIRIN EC 81 MG EC TABLET    Take 1 tablet (81 mg total) by mouth daily.   CARVEDILOL (COREG) 6.25 MG TABLET    Take 1 tablet (6.25 mg total) by mouth 2 (two) times daily with a meal.   Modified Medications   No medications on file   New Prescriptions   No medications on  file    Labs/ tests ordered today include:   Orders Placed This Encounter  Procedures  . EKG 12-Lead     Disposition:    FU me in 1 year.    Signed, Brynda Rim, MHS 10/21/2014 3:01 PM    [facility] [address] [place], [place], [place]  [address] Phone: [phone]; Fax: [phone]

## 2014-10-21 ENCOUNTER — Ambulatory Visit (INDEPENDENT_AMBULATORY_CARE_PROVIDER_SITE_OTHER): Payer: Medicare Other | Admitting: Physician Assistant

## 2014-10-21 ENCOUNTER — Encounter: Payer: Self-pay | Admitting: Physician Assistant

## 2014-10-21 VITALS — BP 118/50 | HR 77 | Ht 64.0 in | Wt 129.4 lb

## 2014-10-21 DIAGNOSIS — N183 Chronic kidney disease, stage 3 (moderate): Secondary | ICD-10-CM

## 2014-10-21 DIAGNOSIS — E785 Hyperlipidemia, unspecified: Secondary | ICD-10-CM | POA: Diagnosis not present

## 2014-10-21 DIAGNOSIS — I2510 Atherosclerotic heart disease of native coronary artery without angina pectoris: Secondary | ICD-10-CM

## 2014-10-21 DIAGNOSIS — I5181 Takotsubo syndrome: Secondary | ICD-10-CM

## 2014-10-21 DIAGNOSIS — I10 Essential (primary) hypertension: Secondary | ICD-10-CM | POA: Diagnosis not present

## 2014-10-21 NOTE — Patient Instructions (Signed)
Medication Instructions:  WHEN YOUR STOMACH IS FEELING BETTER TRY TAKING ASPIRIN 81 MG 2-3 DAYS A WEEK  Labwork: NONE  Testing/Procedures: NONE  Follow-Up: Your physician wants you to follow-up in: 1 YEAR WITH SCOTT WEAVER, [facility] You will receive a reminder letter in the mail two months in advance. If you don't receive a letter, please call our office to schedule the follow-up appointment.   Any Other Special Instructions Will Be Listed Below (If Applicable).

## 2015-01-14 NOTE — Progress Notes (Signed)
Cardiology Office Note    Date:  01/15/2015   ID:  Alicia [name], DOB 04/11/1922, MRN [id number]  PCP:  Georgann HousekeeperHUSAIN,KARRAR, MD  Cardiologist:  Dr. Peter SwazilandJordan   Electrophysiologist:  n/a  Chief Complaint  Patient presents with  . Follow-up  . Cardiomyopathy    History of Present Illness:  Alicia [name] is a 81 y.o. female with a hx of pulmonary fibrosis, anemia, HL, HTN, tobacco abuse.   Admitted 07/2014 with transient R arm weakness. Brain MRI demonstrated no acute findings but cervical spine MRI demonstrated spinal stenosis with cord flattening. No surgical intervention was required. Hospital stay was c/b NSTEMI. She developed CP after admission. Tn peaked at 1.01. LHC demonstrated mild non-obstructive CAD. Echo demonstrated apical AK c/s stress induced CM (Tako-Tsubo CM) with EF 40%.   FU Echo 10/09/14 demonstrated improved LVF with EF 55-60%. She stopped beta blocker therapy on her own secondary to intolerance.  Last seen by me 10/21/14. 1 year follow-up is recommended.  She is referred back today by her primary care physician.  She tells me she received a letter that states she can no longer see Dr. Excell Seltzerooper. She does not have this letter with her. I reviewed her chart. She has never seen Dr. Excell Seltzerooper. Dr. SwazilandJordan saw her when she was hospitalized. She denies chest pain, shortness of breath, syncope, orthopnea, PND. She does have bilateral ankle edema. This is overall stable.   Past Medical History  Diagnosis Date  . H/O Legionnaire's disease   . Pneumonia   . Anemia   . CKD (chronic kidney disease)   . Hyperlipidemia   . Arthritis   . Postinflammatory pulmonary fibrosis (HCC)   . Upper airway cough syndrome   . Peripheral edema   . Chronic constipation   . Hypertension   . Takotsubo cardiomyopathy 07/2014    NSTEMI 07/2014 >>   a. Echo 7/16:  apical AK c/w stress induced CM, EF 40%, no clot, mod TR, PASP 38 mmHg;  b. Limited echo 9/16: EF 55-60%, normal wall motion,  grade 1 diastolic dysfunction, MAC, mild LAE, normal RV function, mild RAE, PASP 38  . CAD (coronary artery disease)     a.  LHC 7/16:  prox to mid LAD 30, OM1 30, OM3 30, pRCA 40  . Carotid stenosis     a.  Carotid US 7/16:  bilat ICA 1-39%  . Cervical spinal stenosis     Past Surgical History  Procedure Laterality Date  . Cholecystectomy    . Abdominal hysterectomy    . Cardiac catheterization N/A 08/16/2014    Procedure: Left Heart Cath and Coronary Angiography;  Surgeon: Dolores Pattyaniel R Bensimhon, MD;  [facility] INVAS[facility] LAB;  Service: Cardiovascular;  Laterality: N/A;    Current Outpatient Prescriptions  Medication Sig Dispense Refill  . aspirin 81 MG tablet Take 81 mg by mouth daily.    [name]. Cod Liver Oil CAPS Take 1 capsule by mouth daily.    . Cyanocobalamin (VITAMIN B 12 PO) Take 1 tablet by mouth daily.    . folic acid (FOLVITE) 1 MG tablet Take 1 mg by mouth daily.    . furosemide (LASIX) 20 MG tablet Take 20 mg by mouth every other day.     . Iron TABS Take 1 tablet by mouth daily.    . magnesium chloride (SLOW-MAG) 64 MG TBEC SR tablet Take 1 tablet by mouth daily.    . Multiple Vitamin (MULTIVITAMIN WITH MINERALS) TABS tablet Take  1 tablet by mouth daily.    . potassium chloride (K-DUR) 10 MEQ tablet Take 10 mEq by mouth every other day.     . Vitamin D, Ergocalciferol, (DRISDOL) 50000 UNITS CAPS capsule Take 50,000 Units by mouth every 7 (seven) days.     No current facility-administered medications for this visit.     Allergies:   Tylenol 8 hour; Aspirin; and Penicillins   Social History   Social History  . Marital Status: Married    Spouse Name: N/A  . Number of Children: N/A  . Years of Education: N/A   Social History Main Topics  . Smoking status: Former Smoker -- 0.50 packs/day for 36 years    Types: Cigarettes    Quit date: 01/25/1977  . Smokeless tobacco: Never Used  . Alcohol Use: No  . Drug Use: No  . Sexual Activity: Not Asked   Other Topics  Concern  . None   Social History Narrative     Family History:  The patient's family history includes Cancer in her mother; Healthy in her brother, brother, father, and sister; Lung cancer in her brother, brother, and brother.   ROS:   Please see the history of present illness.    Review of Systems  Cardiovascular: Positive for leg swelling.  Musculoskeletal: Positive for back pain, joint pain, joint swelling and myalgias.  Psychiatric/Behavioral: The patient is nervous/anxious.   All other systems reviewed and are negative.   PHYSICAL EXAM:   VS:  BP 140/62 mmHg  Pulse 73  Ht  (1.676 m)  Wt 130 lb 6.4 oz (59.149 kg)  BMI 21.06 kg/m2   GEN: Well nourished, well developed, in no acute distress HEENT: normal Neck: no JVD, no masses Cardiac: Normal S1/S2, RRR; no murmurs, rubs, or gallops, 1+ bilateral ankle edema  Respiratory:  clear to auscultation bilaterally; no wheezing, rhonchi or rales GI: soft, nontender MS: no deformity or atrophy Skin: warm and dry, Neuro:  no focal deficits  Psych: Alert and oriented x 3, normal affect  Wt Readings from Last 3 Encounters:  01/15/15 130 lb 6.4 oz (59.149 kg)  10/21/14 129 lb 6.4 oz (58.695 kg)  08/23/14 129 lb 1.9 oz (58.568 kg)      Studies/Labs Reviewed:   EKG:  EKG is not ordered today.  The ekg ordered today demonstrates n/a  Recent Labs: 04/15/2014: Pro B Natriuretic peptide (BNP) 131.0*; TSH 3.16 08/16/2014: ALT 7* 08/20/2014: Hemoglobin 10.9*; Platelets 246 08/23/2014: BUN 26*; Creatinine, Ser 1.49*; Potassium 4.3; Sodium 138   Recent Lipid Panel    Component Value Date/Time   CHOL 117 08/18/2014 1040   TRIG 52 08/18/2014 1040   HDL 32* 08/18/2014 1040   CHOLHDL 3.7 08/18/2014 1040   VLDL 10 08/18/2014 1040   LDLCALC 75 08/18/2014 1040    Additional studies/ records that were reviewed today include:   Echo 10/09/14 EF 55-60%, normal wall motion, grade 1 diastolic dysfunction, MAC, mild LAE, normal RV  function, PASP 38 mmHg  LHC 08/16/14 LAD: prox to mid 30% LCx: OM1 30%; OM3 30% RCA: prox 40%  Carotid US 07/2014 bilat ICA 1-39%  Echo 08/16/14 Mild focal basal septal hypertrophy, grade 1 diastolic dysfunction, EF 40%, akinesis in all the apical segments-Tako-Tsubo cardiomyopathy pattern, moderate TR   ASSESSMENT:    1. Takotsubo syndrome   2. Coronary artery disease - Non-Obstructive by Cath 07/2014     PLAN:  In order of problems listed above:  1. Takotsubo syndrome: She had apical  akinesis with an EF of 40% on echocardiogram in 7/16 and no significant CAD on cardiac catheterization 7/16 c/w stress induced CM. Repeat echo in 9/16 demonstrated normal LVF with no RWMA. She did not tolerate beta-blocker therapy and he EF recovered without it. There has not been clear data to support a survival benefit with beta-blockers in patient's with stress induced CM. There have been some reports that suggest improved 1 year survival with ACE inhibitors or angiotensin receptor blockers. However, I have elected not to start these in the past given her renal function and advanced age. Since last seen, she has remained stable. She has no evidence of volume excess or reason to suggest worsening LV function. No further evaluation is warranted at this time.  2. Coronary artery disease - Non-Obstructive by Cath 07/2014: No significant CAD on cardiac catheterization. She has been somewhat intolerant of ASA in the past. she is currently tolerating aspirin 81 mg daily. She denies anginal symptoms.   Medication Adjustments/Labs and Tests Ordered: Current medicines are reviewed at length with the patient today.  Concerns regarding medicines are outlined above.  Medication changes, Labs and Tests ordered today are listed below. Patient Instructions  Medication Instructions:  No changes.  Continue current medications.  Labwork: None today.   Testing/Procedures: None   Follow-Up: Tereso Newcomer,  PA-C in 1 year.   Any Other Special Instructions Will Be Listed Below (If Applicable).       Signed, Tereso Newcomer, PA-C  01/15/2015 4:34 PM    [facility] [address] [place], [place], [place]  [address] Phone: [phone]; Fax: [phone]

## 2015-01-15 ENCOUNTER — Ambulatory Visit (INDEPENDENT_AMBULATORY_CARE_PROVIDER_SITE_OTHER): Payer: Medicare Other | Admitting: Physician Assistant

## 2015-01-15 ENCOUNTER — Encounter: Payer: Self-pay | Admitting: Physician Assistant

## 2015-01-15 VITALS — BP 140/62 | HR 73 | Ht 66.0 in | Wt 130.4 lb

## 2015-01-15 DIAGNOSIS — I2510 Atherosclerotic heart disease of native coronary artery without angina pectoris: Secondary | ICD-10-CM | POA: Diagnosis not present

## 2015-01-15 DIAGNOSIS — I5181 Takotsubo syndrome: Secondary | ICD-10-CM

## 2015-01-15 NOTE — Patient Instructions (Signed)
Medication Instructions:  No changes.  Continue current medications.  Labwork: None today.   Testing/Procedures: None   Follow-Up: Tereso NewcomerScott Weaver, PA-C in 1 year.   Any Other Special Instructions Will Be Listed Below (If Applicable).

## 2016-03-28 ENCOUNTER — Encounter (HOSPITAL_COMMUNITY): Payer: Self-pay

## 2016-03-28 ENCOUNTER — Emergency Department (HOSPITAL_COMMUNITY)
Admission: EM | Admit: 2016-03-28 | Discharge: 2016-03-28 | Disposition: A | Discharge status: Home / Self Care | Payer: Medicare Other | Attending: Emergency Medicine | Admitting: Emergency Medicine

## 2016-03-28 DIAGNOSIS — Z87891 Personal history of nicotine dependence: Secondary | ICD-10-CM | POA: Insufficient documentation

## 2016-03-28 DIAGNOSIS — Z79899 Other long term (current) drug therapy: Secondary | ICD-10-CM | POA: Diagnosis not present

## 2016-03-28 DIAGNOSIS — N189 Chronic kidney disease, unspecified: Secondary | ICD-10-CM | POA: Insufficient documentation

## 2016-03-28 DIAGNOSIS — K5900 Constipation, unspecified: Secondary | ICD-10-CM | POA: Diagnosis present

## 2016-03-28 DIAGNOSIS — Z955 Presence of coronary angioplasty implant and graft: Secondary | ICD-10-CM | POA: Insufficient documentation

## 2016-03-28 DIAGNOSIS — I252 Old myocardial infarction: Secondary | ICD-10-CM | POA: Insufficient documentation

## 2016-03-28 DIAGNOSIS — Z7982 Long term (current) use of aspirin: Secondary | ICD-10-CM | POA: Diagnosis not present

## 2016-03-28 DIAGNOSIS — I2510 Atherosclerotic heart disease of native coronary artery without angina pectoris: Secondary | ICD-10-CM | POA: Diagnosis not present

## 2016-03-28 DIAGNOSIS — I129 Hypertensive chronic kidney disease with stage 1 through stage 4 chronic kidney disease, or unspecified chronic kidney disease: Secondary | ICD-10-CM | POA: Diagnosis not present

## 2016-03-28 NOTE — ED Provider Notes (Signed)
[name] DEPT Provider Note   CSN: [id number] Arrival date & time: 03/28/16  1107     History   Chief Complaint Chief Complaint  Patient presents with  . Constipation    HPI Alicia [name] is a 83 y.o. female.  The history is provided by the patient.  Constipation   This is a recurrent problem. Episode onset: many years ago. The stool is described as loose. Pertinent negatives include no abdominal pain, no flatus and no dysuria. Associated symptoms comments: Ten[place]mus. There is no fiber in the patient's diet. She does not exercise regularly. Treatments tried: laxative.    Past Medical History:  Diagnosis Date  . Anemia   . Arthritis   . CAD (coronary artery disease)    a.  LHC 7/16:  prox to mid LAD 30, OM1 30, OM3 30, pRCA 40  . Carotid stenosis    a.  Carotid US 7/16:  bilat ICA 1-39%  . Cervical spinal stenosis   . Chronic constipation   . CKD (chronic kidney disease)   . H/O Legionnaire's disease   . Hyperlipidemia   . Hypertension   . Peripheral edema   . Pneumonia   . Postinflammatory pulmonary fibrosis ([place])   . Takotsubo cardiomyopathy 07/2014   NSTEMI 07/2014 >>   a. Echo 7/16:  apical AK c/w stress induced CM, EF 40%, no clot, mod TR, PASP 38 mmHg;  b. Limited echo 9/16: EF 55-60%, normal wall motion, grade 1 diastolic dysfunction, MAC, mild LAE, normal RV function, mild RAE, PASP 38  . Upper airway cough syndrome     Patient Active Problem List   Diagnosis Date Noted  . Coronary artery disease involving native coronary artery of native heart without angina pectoris 08/23/2014  . Essential hypertension 08/23/2014  . Hyperlipidemia 08/23/2014  . Takotsubo syndrome 08/17/2014  . Cervical spinal cord compression ([place])   . CKD (chronic kidney disease)   . Spinal stenosis in cervical region 08/16/2014  . NSTEMI (non-ST elevated myocardial infarction) ([place]) 08/16/2014  . AKI (acute kidney injury) ([place]) 08/15/2014  . Right hand weakness 08/15/2014  .  Anemia 08/15/2014  . Peripheral edema 04/21/2014  . Dyspnea 04/15/2014  . Upper airway cough syndrome 10/08/2013  . Postinflammatory pulmonary fibrosis ([place]) 10/08/2013    Past Surgical History:  Procedure Laterality Date  . ABDOMINAL HYSTERECTOMY    . CARDIAC CATHETERIZATION N/A 08/16/2014   Procedure: Left Heart Cath and Coronary Angiography;  Surgeon: Jolaine Artist, MD;  Location: [facility];  Service: Cardiovascular;  Laterality: N/A;  . CHOLECYSTECTOMY      OB History    No data available       Home Medications    Prior to Admission medications   Medication Sig Start Date End Date Taking? Authorizing Provider  aspirin 81 MG tablet Take 81 mg by mouth daily.    Historical Provider, MD  [facility] Liver Oil CAPS Take 1 capsule by mouth daily.    Historical Provider, MD  Cyanocobalamin (VITAMIN B 12 PO) Take 1 tablet by mouth daily.    Historical Provider, MD  folic acid (FOLVITE) 1 MG tablet Take 1 mg by mouth daily.    Historical Provider, MD  furosemide (LASIX) 20 MG tablet Take 20 mg by mouth every other day.     Historical Provider, MD  Iron TABS Take 1 tablet by mouth daily.    Historical Provider, MD  magnesium chloride (SLOW-MAG) 64 MG TBEC SR tablet Take 1 tablet by mouth  daily.    Historical Provider, MD  Multiple Vitamin (MULTIVITAMIN WITH MINERALS) TABS tablet Take 1 tablet by mouth daily.    Historical Provider, MD  potassium chloride (K-DUR) 10 MEQ tablet Take 10 mEq by mouth every other day.     Historical Provider, MD  Vitamin D, Ergocalciferol, (DRISDOL) 50000 UNITS CAPS capsule Take 50,000 Units by mouth every 7 (seven) days.    Historical Provider, MD    Family History Family History  Problem Relation Age of Onset  . Lung cancer Brother     NEVER SMOKED  . Cancer Mother   . Healthy Father   . Healthy Sister   . Lung cancer Brother     NEVER SMOKED  . Lung cancer Brother     NEVER SMOKED  . Healthy Brother   . Healthy Brother     Social  History Social History  Substance Use Topics  . Smoking status: Former Smoker    Packs/day: 0.50    Years: 36.00    Types: Cigarettes    Quit date: 01/25/1977  . Smokeless tobacco: Never Used  . Alcohol use No     Allergies   Tylenol 8 hour [acetaminophen]; Aspirin; and Penicillins   Review of Systems Review of Systems  Gastrointestinal: Positive for constipation. Negative for abdominal pain and flatus.  Genitourinary: Negative for dysuria.  Ten systems are reviewed and are negative for acute change except as noted in the HPI    Physical Exam Updated Vital Signs BP 180/82 (BP Location: Left Arm)   Pulse 73   Temp 98.1 F (36.7 C) (Oral)   Resp 20   SpO2 100%   Physical Exam  Constitutional: She is oriented to person, place, and time. She appears well-developed and well-nourished. No distress.  HENT:  Head: Normocephalic and atraumatic.  Nose: Nose normal.  Eyes: Conjunctivae and EOM are normal. Pupils are equal, round, and reactive to light. Right eye exhibits no discharge. Left eye exhibits no discharge. No scleral icterus.  Neck: Normal range of motion. Neck supple.  Cardiovascular: Normal rate and regular rhythm.  Exam reveals no gallop and no friction rub.   No murmur heard. Pulmonary/Chest: Effort normal and breath sounds normal. No stridor. No respiratory distress. She has no rales.  Abdominal: Soft. She exhibits no distension. There is no tenderness.  Genitourinary:  Genitourinary Comments: No stool in the rectal vault.  Musculoskeletal: She exhibits no edema or tenderness.  Neurological: She is alert and oriented to person, place, and time.  Skin: Skin is warm and dry. No rash noted. She is not diaphoretic. No erythema.  Psychiatric: She has a normal mood and affect.  Vitals reviewed.    ED Treatments / Results  Labs (all labs ordered are listed, but only abnormal results are displayed) Labs Reviewed - No data to display  EKG  EKG  Interpretation None       Radiology No results found.  Procedures Procedures (including critical care time)  Medications Ordered in ED Medications - No data to display   Initial Impression / Assessment and Plan / ED Course  I have reviewed the triage vital signs and the nursing notes.  Pertinent labs & imaging results that were available during my care of the patient were reviewed by me and considered in my medical decision making (see chart for details).     The patient appears well, in no acute distress, without evidence of toxicity or dehydration. They are interactive and following commands. Asymptomatic at this  time. No rectal impaction. No need for labs or imaging. The patient is safe for discharge with strict return precautions.    Final Clinical Impressions(s) / ED Diagnoses   Final diagnoses:  Constipation, unspecified constipation type   Disposition: Discharge  Condition: Good  I have discussed the results, Dx and Tx plan with the patient who expressed understanding and agree(s) with the plan. Discharge instructions discussed at great length. The patient was given strict return precautions who verbalized understanding of the instructions. No further questions at time of discharge.    New Prescriptions   No medications on file    Follow Up: Wenda Low, MD [address] [place] [address] [phone]  Schedule an appointment as soon as possible for a visit  As needed      Fatima Blank, MD 03/28/16 [phone]

## 2016-03-28 NOTE — ED Triage Notes (Signed)
Hx of abdominal surgery.  Pt has had off/on constipation for past 2 years.  LBM last night.  No n/v.  Slight abdominal pain

## 2016-05-25 ENCOUNTER — Ambulatory Visit
Admission: RE | Admit: 2016-05-25 | Discharge: 2016-05-25 | Disposition: A | Discharge status: Home / Self Care | Payer: Medicare Other | Source: Ambulatory Visit | Attending: Internal Medicine | Admitting: Internal Medicine

## 2016-05-25 ENCOUNTER — Other Ambulatory Visit: Payer: Self-pay | Admitting: Internal Medicine

## 2016-05-25 DIAGNOSIS — M25531 Pain in right wrist: Secondary | ICD-10-CM

## 2016-09-15 ENCOUNTER — Encounter (HOSPITAL_COMMUNITY): Payer: Self-pay

## 2016-09-15 ENCOUNTER — Emergency Department (HOSPITAL_COMMUNITY): Payer: Medicare Other

## 2016-09-15 ENCOUNTER — Inpatient Hospital Stay (HOSPITAL_COMMUNITY)
Admission: EM | Admit: 2016-09-15 | Discharge: 2016-09-25 | DRG: 280 | Disposition: A | Discharge status: Hospice | Payer: Medicare Other | Attending: Internal Medicine | Admitting: Internal Medicine

## 2016-09-15 DIAGNOSIS — I10 Essential (primary) hypertension: Secondary | ICD-10-CM | POA: Diagnosis not present

## 2016-09-15 DIAGNOSIS — I5041 Acute combined systolic (congestive) and diastolic (congestive) heart failure: Secondary | ICD-10-CM | POA: Diagnosis present

## 2016-09-15 DIAGNOSIS — Z87891 Personal history of nicotine dependence: Secondary | ICD-10-CM

## 2016-09-15 DIAGNOSIS — E785 Hyperlipidemia, unspecified: Secondary | ICD-10-CM | POA: Diagnosis present

## 2016-09-15 DIAGNOSIS — I214 Non-ST elevation (NSTEMI) myocardial infarction: Principal | ICD-10-CM | POA: Diagnosis present

## 2016-09-15 DIAGNOSIS — E871 Hypo-osmolality and hyponatremia: Secondary | ICD-10-CM | POA: Diagnosis present

## 2016-09-15 DIAGNOSIS — Z79899 Other long term (current) drug therapy: Secondary | ICD-10-CM

## 2016-09-15 DIAGNOSIS — E875 Hyperkalemia: Secondary | ICD-10-CM | POA: Diagnosis present

## 2016-09-15 DIAGNOSIS — E872 Acidosis: Secondary | ICD-10-CM | POA: Diagnosis present

## 2016-09-15 DIAGNOSIS — N185 Chronic kidney disease, stage 5: Secondary | ICD-10-CM

## 2016-09-15 DIAGNOSIS — N179 Acute kidney failure, unspecified: Secondary | ICD-10-CM | POA: Diagnosis present

## 2016-09-15 DIAGNOSIS — J181 Lobar pneumonia, unspecified organism: Secondary | ICD-10-CM | POA: Diagnosis present

## 2016-09-15 DIAGNOSIS — I2510 Atherosclerotic heart disease of native coronary artery without angina pectoris: Secondary | ICD-10-CM | POA: Diagnosis present

## 2016-09-15 DIAGNOSIS — I272 Pulmonary hypertension, unspecified: Secondary | ICD-10-CM | POA: Diagnosis present

## 2016-09-15 DIAGNOSIS — J8410 Pulmonary fibrosis, unspecified: Secondary | ICD-10-CM | POA: Diagnosis present

## 2016-09-15 DIAGNOSIS — Z886 Allergy status to analgesic agent status: Secondary | ICD-10-CM | POA: Diagnosis not present

## 2016-09-15 DIAGNOSIS — E43 Unspecified severe protein-calorie malnutrition: Secondary | ICD-10-CM | POA: Diagnosis present

## 2016-09-15 DIAGNOSIS — Z7982 Long term (current) use of aspirin: Secondary | ICD-10-CM

## 2016-09-15 DIAGNOSIS — Z7189 Other specified counseling: Secondary | ICD-10-CM | POA: Diagnosis not present

## 2016-09-15 DIAGNOSIS — I252 Old myocardial infarction: Secondary | ICD-10-CM

## 2016-09-15 DIAGNOSIS — Z880 Allergy status to penicillin: Secondary | ICD-10-CM

## 2016-09-15 DIAGNOSIS — R079 Chest pain, unspecified: Secondary | ICD-10-CM | POA: Diagnosis not present

## 2016-09-15 DIAGNOSIS — J189 Pneumonia, unspecified organism: Secondary | ICD-10-CM

## 2016-09-15 DIAGNOSIS — D638 Anemia in other chronic diseases classified elsewhere: Secondary | ICD-10-CM | POA: Diagnosis present

## 2016-09-15 DIAGNOSIS — R062 Wheezing: Secondary | ICD-10-CM

## 2016-09-15 DIAGNOSIS — Z66 Do not resuscitate: Secondary | ICD-10-CM | POA: Diagnosis present

## 2016-09-15 DIAGNOSIS — Z6821 Body mass index (BMI) 21.0-21.9, adult: Secondary | ICD-10-CM

## 2016-09-15 DIAGNOSIS — R7989 Other specified abnormal findings of blood chemistry: Secondary | ICD-10-CM | POA: Diagnosis not present

## 2016-09-15 DIAGNOSIS — I132 Hypertensive heart and chronic kidney disease with heart failure and with stage 5 chronic kidney disease, or end stage renal disease: Secondary | ICD-10-CM | POA: Diagnosis present

## 2016-09-15 DIAGNOSIS — Z515 Encounter for palliative care: Secondary | ICD-10-CM

## 2016-09-15 DIAGNOSIS — F0390 Unspecified dementia without behavioral disturbance: Secondary | ICD-10-CM | POA: Diagnosis present

## 2016-09-15 DIAGNOSIS — R0902 Hypoxemia: Secondary | ICD-10-CM | POA: Diagnosis present

## 2016-09-15 DIAGNOSIS — K921 Melena: Secondary | ICD-10-CM | POA: Diagnosis present

## 2016-09-15 DIAGNOSIS — M1712 Unilateral primary osteoarthritis, left knee: Secondary | ICD-10-CM | POA: Diagnosis present

## 2016-09-15 DIAGNOSIS — R609 Edema, unspecified: Secondary | ICD-10-CM | POA: Diagnosis not present

## 2016-09-15 DIAGNOSIS — I361 Nonrheumatic tricuspid (valve) insufficiency: Secondary | ICD-10-CM | POA: Diagnosis not present

## 2016-09-15 LAB — COMPREHENSIVE METABOLIC PANEL
ALT: 10 U/L — ABNORMAL LOW (ref 14–54)
AST: 28 U/L (ref 15–41)
Albumin: 3.2 g/dL — ABNORMAL LOW (ref 3.5–5.0)
Alkaline Phosphatase: 60 U/L (ref 38–126)
Anion gap: 6 (ref 5–15)
BUN: 31 mg/dL — ABNORMAL HIGH (ref 6–20)
CALCIUM: 9.2 mg/dL (ref 8.9–10.3)
CO2: 25 mmol/L (ref 22–32)
Chloride: 102 mmol/L (ref 101–111)
Creatinine, Ser: 1.5 mg/dL — ABNORMAL HIGH (ref 0.44–1.00)
GFR calc Af Amer: 34 mL/min — ABNORMAL LOW (ref >60)
GFR calc non Af Amer: 29 mL/min — ABNORMAL LOW (ref >60)
GLUCOSE: 97 mg/dL (ref 65–99)
Potassium: 4.7 mmol/L (ref 3.5–5.1)
Sodium: 133 mmol/L — ABNORMAL LOW (ref 135–145)
Total Bilirubin: 0.9 mg/dL (ref 0.3–1.2)
Total Protein: 6.9 g/dL (ref 6.5–8.1)

## 2016-09-15 LAB — CBC WITH DIFFERENTIAL/PLATELET
BASOS ABS: 0 10*3/uL (ref 0.0–0.1)
Basophils Relative: 0 %
EOS ABS: 0.1 10*3/uL (ref 0.0–0.7)
Eosinophils Relative: 1 %
HEMATOCRIT: 32.1 % — AB (ref 36.0–46.0)
Hemoglobin: 10.6 g/dL — ABNORMAL LOW (ref 12.0–15.0)
Lymphocytes Relative: 15 %
Lymphs Abs: 1.1 10*3/uL (ref 0.7–4.0)
MCH: 29 pg (ref 26.0–34.0)
MCHC: 33 g/dL (ref 30.0–36.0)
MCV: 87.7 fL (ref 78.0–100.0)
Monocytes Absolute: 0.4 10*3/uL (ref 0.1–1.0)
Monocytes Relative: 5 %
NEUTROS ABS: 6 10*3/uL (ref 1.7–7.7)
Neutrophils Relative %: 79 %
Platelets: 254 10*3/uL (ref 150–400)
RBC: 3.66 MIL/uL — AB (ref 3.87–5.11)
RDW: 14.3 % (ref 11.5–15.5)
WBC: 7.5 10*3/uL (ref 4.0–10.5)

## 2016-09-15 LAB — POC OCCULT BLOOD, ED: Fecal Occult Bld: NEGATIVE

## 2016-09-15 LAB — TROPONIN I: Troponin I: 0.89 ng/mL (ref <0.03)

## 2016-09-15 LAB — D-DIMER, QUANTITATIVE: D-Dimer, Quant: 7.13 ug/mL-FEU — ABNORMAL HIGH (ref 0.00–0.50)

## 2016-09-15 MED ORDER — ROSUVASTATIN CALCIUM 20 MG PO TABS
40.0000 mg | ORAL_TABLET | Freq: Every day | ORAL | Status: DC
  Administered 2016-09-15 – 2016-09-23 (×9): 40 mg via ORAL
  Filled 2016-09-15: qty 1
  Filled 2016-09-15 (×3): qty 2
  Filled 2016-09-15 (×2): qty 1
  Filled 2016-09-15 (×4): qty 2
  Filled 2016-09-15 (×2): qty 1
  Filled 2016-09-15 (×2): qty 2

## 2016-09-15 MED ORDER — ASPIRIN 81 MG PO TABS: 81.0000 mg | ORAL_TABLET | Freq: Every day | ORAL | Status: DC

## 2016-09-15 MED ORDER — NITROGLYCERIN 0.4 MG SL SUBL
0.4000 mg | SUBLINGUAL_TABLET | SUBLINGUAL | Status: DC | PRN
  Administered 2016-09-17: 0.4 mg via SUBLINGUAL
  Filled 2016-09-15 (×2): qty 1

## 2016-09-15 MED ORDER — CLOPIDOGREL BISULFATE 75 MG PO TABS: 75.0000 mg | ORAL_TABLET | Freq: Once | ORAL | Status: DC

## 2016-09-15 MED ORDER — HEPARIN SODIUM (PORCINE) 5000 UNIT/ML IJ SOLN
5000.0000 [IU] | Freq: Three times a day (TID) | INTRAMUSCULAR | Status: DC
  Administered 2016-09-15 – 2016-09-16 (×2): 5000 [IU] via SUBCUTANEOUS
  Filled 2016-09-15 (×2): qty 1

## 2016-09-15 MED ORDER — ONDANSETRON HCL 4 MG/2ML IJ SOLN
4.0000 mg | Freq: Four times a day (QID) | INTRAMUSCULAR | Status: DC | PRN
  Administered 2016-09-16 – 2016-09-20 (×5): 4 mg via INTRAVENOUS
  Filled 2016-09-15 (×5): qty 2

## 2016-09-15 MED ORDER — CLOPIDOGREL BISULFATE 300 MG PO TABS
300.0000 mg | ORAL_TABLET | Freq: Once | ORAL | Status: AC
  Administered 2016-09-15: 300 mg via ORAL
  Filled 2016-09-15: qty 1

## 2016-09-15 MED ORDER — FUROSEMIDE 40 MG PO TABS
20.0000 mg | ORAL_TABLET | ORAL | Status: DC
  Administered 2016-09-16 – 2016-09-20 (×2): 20 mg via ORAL
  Filled 2016-09-15 (×3): qty 1

## 2016-09-15 MED ORDER — MAGNESIUM CHLORIDE 64 MG PO TBEC
1.0000 | DELAYED_RELEASE_TABLET | Freq: Every day | ORAL | Status: DC
  Administered 2016-09-16 – 2016-09-23 (×7): 64 mg via ORAL
  Filled 2016-09-15 (×10): qty 1

## 2016-09-15 MED ORDER — POTASSIUM CHLORIDE ER 10 MEQ PO TBCR
10.0000 meq | EXTENDED_RELEASE_TABLET | ORAL | Status: DC
  Administered 2016-09-16 – 2016-09-20 (×2): 10 meq via ORAL
  Filled 2016-09-15 (×5): qty 1

## 2016-09-15 MED ORDER — GI COCKTAIL ~~LOC~~
30.0000 mL | Freq: Once | ORAL | Status: AC
Start: 2016-09-15 — End: 2016-09-15
  Administered 2016-09-15: 30 mL via ORAL
  Filled 2016-09-15: qty 30

## 2016-09-15 MED ORDER — MORPHINE SULFATE (PF) 2 MG/ML IV SOLN
2.0000 mg | INTRAVENOUS | Status: DC | PRN
  Administered 2016-09-16 – 2016-09-18 (×2): 2 mg via INTRAVENOUS
  Filled 2016-09-15 (×2): qty 1

## 2016-09-15 MED ORDER — ASPIRIN 81 MG PO CHEW
81.0000 mg | CHEWABLE_TABLET | Freq: Every day | ORAL | Status: DC
  Administered 2016-09-15 – 2016-09-23 (×8): 81 mg via ORAL
  Filled 2016-09-15 (×9): qty 1

## 2016-09-15 MED ORDER — FOLIC ACID 1 MG PO TABS
1.0000 mg | ORAL_TABLET | Freq: Every day | ORAL | Status: DC
  Administered 2016-09-16 – 2016-09-23 (×7): 1 mg via ORAL
  Filled 2016-09-15 (×8): qty 1

## 2016-09-15 NOTE — H&P (Signed)
History and Physical  DYMIN DINGLEDINE SWH:[id number] DOB: 1922-11-09 DOA: 09/15/2016  PCP:  Wenda Low, MD   Chief Complaint:  Dark stool Chest pain  History of Present Illness:  Pt is a 83 yo female with hx of CKD, CAD who came with cc of dark stool and chest pain. She said she was in her usual state of health until yesterday when she had chest pain across her chest, worsen with breathing, non radiating, moderate, w/o dyspnea/cough/fever/chills along with constipation. She took a stool softener and went to bed but later had two dark BMs which got her worried that she may have had bloody stool which she never had before. She had mild abdominal discomfort w/o nausea/vomiting/diarrhea. No other complaints.   Review of Systems:  CONSTITUTIONAL:     No night sweats.  No fatigue.  No fever. No chills. Eyes:                            No visual changes.  No eye pain.  No eye discharge.   ENT:                              No epistaxis.  No sinus pain.  No sore throat.   No congestion. RESPIRATORY:           No cough.  No wheeze.  No hemoptysis.  No dyspnea CARDIOVASCULAR   :  +chest pains.  No palpitations. GASTROINTESTINAL:  No abdominal pain.  No nausea. No vomiting.  No diarrhea. +constipation.  No hematemesis.  No hematochezia.  No melena. GENITOURINARY:      No urgency.  No frequency.  No dysuria.  No hematuria.  No    obstructive symptoms.  No discharge.  No pain.  MUSCULOSKELETAL:  No musculoskeletal pain.  No joint swelling.  No arthritis. NEUROLOGICAL:        No confusion.  No weakness. No headache. No seizure. PSYCHIATRIC:             No depression. No anxiety. No suicidal ideation. SKIN:                             No rashes.  No lesions.  No wounds. ENDOCRINE:                No weight loss.  No polydipsia.  No polyuria.  No polyphagia. HEMATOLOGIC:           No purpura.  No petechiae.  No bleeding.  ALLERGIC                 : No pruritus.  No angioedema Other:  Past  Medical and Surgical History:   Past Medical History:  Diagnosis Date  . Anemia   . Arthritis   . CAD (coronary artery disease)    a.  LHC 7/16:  prox to mid LAD 30, OM1 30, OM3 30, pRCA 40  . Carotid stenosis    a.  Carotid US 7/16:  bilat ICA 1-39%  . Cervical spinal stenosis   . Chronic constipation   . CKD (chronic kidney disease)   . H/O Legionnaire's disease   . Hyperlipidemia   . Hypertension   . Peripheral edema   . Pneumonia   . Postinflammatory pulmonary fibrosis ([place])   . Takotsubo cardiomyopathy 07/2014   NSTEMI 07/2014 >>  a. Echo 7/16:  apical AK c/w stress induced CM, EF 40%, no clot, mod TR, PASP 38 mmHg;  b. Limited echo 9/16: EF 55-60%, normal wall motion, grade 1 diastolic dysfunction, MAC, mild LAE, normal RV function, mild RAE, PASP 38  . Upper airway cough syndrome    Past Surgical History:  Procedure Laterality Date  . ABDOMINAL HYSTERECTOMY    . CARDIAC CATHETERIZATION N/A 08/16/2014   Procedure: Left Heart Cath and Coronary Angiography;  Surgeon: Jolaine Artist, MD;  Location: [facility];  Service: Cardiovascular;  Laterality: N/A;  . CHOLECYSTECTOMY      Social History:   reports that she quit smoking about 39 years ago. Her smoking use included Cigarettes. She has a 18.00 pack-year smoking history. She has never used smokeless tobacco. She reports that she does not drink alcohol or use drugs.    Allergies  Allergen Reactions  . Tylenol 8 Hour [Acetaminophen] Other (See Comments)    GI Upset  . Aspirin Nausea And Vomiting    GI Upset  . Penicillins Rash    Family History  Problem Relation Age of Onset  . Lung cancer Brother        NEVER SMOKED  . Cancer Mother   . Healthy Father   . Healthy Sister   . Lung cancer Brother        NEVER SMOKED  . Lung cancer Brother        NEVER SMOKED  . Healthy Brother   . Healthy Brother       Prior to Admission medications   Medication Sig Start Date End Date Taking? Authorizing  Provider  aspirin 81 MG tablet Take 81 mg by mouth daily.    [provider]  [facility] Liver Oil CAPS Take 1 capsule by mouth daily.    [provider]  Cyanocobalamin (VITAMIN B 12 PO) Take 1 tablet by mouth daily.    [provider]  folic acid (FOLVITE) 1 MG tablet Take 1 mg by mouth daily.    [provider]  furosemide (LASIX) 20 MG tablet Take 20 mg by mouth every other day.     [provider]  Iron TABS Take 1 tablet by mouth daily.    [provider]  magnesium chloride (SLOW-MAG) 64 MG TBEC SR tablet Take 1 tablet by mouth daily.    [provider]  Multiple Vitamin (MULTIVITAMIN WITH MINERALS) TABS tablet Take 1 tablet by mouth daily.    [provider]  potassium chloride (K-DUR) 10 MEQ tablet Take 10 mEq by mouth every other day.     [provider]  Vitamin D, Ergocalciferol, (DRISDOL) 50000 UNITS CAPS capsule Take 50,000 Units by mouth every 7 (seven) days.    [provider]    Physical Exam: BP 118/77 (BP Location: Right Arm)   Pulse 78   Temp 98.1 F (36.7 C) (Oral)   Resp 20   Ht 5' 4"  (1.626 m)   Wt 54.9 kg (121 lb)   SpO2 90%   BMI 20.77 kg/m   GENERAL :   Alert and cooperative, and appears to be in no acute distress. HEAD:           normocephalic. EYES:            PERRL, EOMI.  vision is grossly intact. EARS:           hearing grossly intact.  NECK:  supple, non-tender. No lymphadenopathy CARDIAC:    Normal S1 and S2. No gallop. No murmurs. Mild Bilateral edema L>R Vascular:     no peripheral edema.  LUNGS:       Clear to auscultation  ABDOMEN: Positive bowel sounds. Soft, nondistended, nontender. No guarding or rebound.      MSK:           No joint erythema or tenderness. Normal muscular development. EXT           : left knee warm to touch w/o tenderness/effusion Neuro        : Alert, oriented to person, place, and time.                      CN II-XII intact.                    SKIN:            No rash. No lesions. PSYCH:       No hallucination. Patient is not suicidal.          Labs on Admission:  Reviewed.   Radiological Exams on Admission: Dg Chest 2 View  Result Date: 09/15/2016 CLINICAL DATA:  Chest pain. EXAM: CHEST  2 VIEW COMPARISON:  Radiograph August 20, 2014. FINDINGS: Stable cardiomediastinal silhouette. Atherosclerosis of thoracic aorta is noted. No pneumothorax or pleural effusion is noted. Left midlung opacity is noted concerning for possible pneumonia or inflammation. Multilevel degenerative changes are noted in the thoracic spine. IMPRESSION: Focal left midlung opacity is noted concerning for pneumonia for atelectasis. Followup PA and lateral chest X-ray is recommended in 3-4 weeks following trial of antibiotic therapy to ensure resolution and exclude underlying malignancy. Electronically Signed   By: Marijo Conception, M.D.   On: 09/15/2016 19:30    EKG:  Independently reviewed. T inversions in chest leads  Assessment/Plan    NSTEMI:  Continue asp, give plavix 300 mg, hold on AC per cardio as pt is CP free now. Will continue to trend trops, keep NPO after MN. Will check Echo. Will give statin.   Chest pain:  Atypical Low probability for PE; d dimer pending.  Can't get CTPE due to CKD.  Likely due to NSTEMI.   Left knee is warm to touch, and pt complain of mild pain in it, likely flare of OA. Consult PT in am and hold NSAIDs due to CKD. Keep on morphine prn.   CKD: cr at baseline, will monitor.  Input & Output: NA Lines & Tubes: PIV DVT prophylaxis: [place] hep GI prophylaxis: NA Consultants: cardio Code Status: Full Family Communication: none at bedside  Disposition Plan: TBD    Gennaro Africa M.D [facility]

## 2016-09-15 NOTE — ED Provider Notes (Addendum)
Alicia [name]   CSN: [id number] Arrival date & time: 09/15/16  1614     History   Chief Complaint Chief Complaint  Patient presents with  . Heartburn  . Melena    HPI Alicia [name] is a 83 y.o. female.  HPI Pt with hx of non obstructive CAD, CKD comes in with cc of dark stools and chest discomfort. Pt reports that she started having some chest discomfort last night. The chest pain is intermittent, and is located in her anterior chest. The pain is similar to gas type pain. Pt has no associated dib, pain is not worse with inspirations or with exertion. Pt also complains of dark stool x 2 today. Pt states her stools were pitch black. She had  no abd pain. Pt denies any hx of stomach ulcers. She takes asa intermittently. Pt has a colonoscopy several years ago.  Past Medical History:  Diagnosis Date  . Anemia   . Arthritis   . CAD (coronary artery disease)    a.  LHC 7/16:  prox to mid LAD 30, OM1 30, OM3 30, pRCA 40  . Carotid stenosis    a.  Carotid US 7/16:  bilat ICA 1-39%  . Cervical spinal stenosis   . Chronic constipation   . CKD (chronic kidney disease)   . H/O Legionnaire's disease   . Hyperlipidemia   . Hypertension   . Peripheral edema   . Pneumonia   . Postinflammatory pulmonary fibrosis ([place])   . Takotsubo cardiomyopathy 07/2014   NSTEMI 07/2014 >>   a. Echo 7/16:  apical AK c/w stress induced CM, EF 40%, no clot, mod TR, PASP 38 mmHg;  b. Limited echo 9/16: EF 55-60%, normal wall motion, grade 1 diastolic dysfunction, MAC, mild LAE, normal RV function, mild RAE, PASP 38  . Upper airway cough syndrome     Patient Active Problem List   Diagnosis Date Noted  . Acute combined systolic and diastolic heart failure ([place])   . Elevated d-dimer   . Coronary artery disease involving native coronary artery of native heart without angina pectoris 08/23/2014  . Essential hypertension 08/23/2014  . Hyperlipidemia 08/23/2014  . Takotsubo syndrome  08/17/2014  . Cervical spinal cord compression ([place])   . CKD (chronic kidney disease)   . Spinal stenosis in cervical region 08/16/2014  . NSTEMI (non-ST elevated myocardial infarction) ([place]) 08/16/2014  . AKI (acute kidney injury) ([place]) 08/15/2014  . Right hand weakness 08/15/2014  . Anemia 08/15/2014  . Peripheral edema 04/21/2014  . Dyspnea 04/15/2014  . Upper airway cough syndrome 10/08/2013  . Postinflammatory pulmonary fibrosis ([place]) 10/08/2013    Past Surgical History:  Procedure Laterality Date  . ABDOMINAL HYSTERECTOMY    . CARDIAC CATHETERIZATION N/A 08/16/2014   Procedure: Left Heart Cath and Coronary Angiography;  Surgeon: Jolaine Artist, MD;  Location: [facility];  Service: Cardiovascular;  Laterality: N/A;  . CHOLECYSTECTOMY      OB History    No data available       Home Medications    Prior to Admission medications   Medication Sig Start Date End Date Taking? Authorizing Provider  Cholecalciferol (VITAMIN D3) 5000 units CAPS Take 5,000 Units by mouth daily.   Yes [provider]  Cyanocobalamin (VITAMIN B 12 PO) Take 1 tablet by mouth daily.   Yes [provider]  famotidine (PEPCID) 20 MG tablet Take 20 mg by mouth at bedtime.   Yes [provider]  feeding  supplement (BOOST HIGH PROTEIN) LIQD Take 1 Container by mouth 2 (two) times daily between meals.   Yes [provider]  Flaxseed, Linseed, (FLAX SEED OIL PO) Take 1 capsule by mouth daily.   Yes [provider]  folic acid (FOLVITE) 1 MG tablet Take 1 mg by mouth daily.   Yes [provider]  furosemide (LASIX) 20 MG tablet Take 20 mg by mouth daily as needed.    Yes [provider]  Glycerin-Hypromellose-PEG 400 (TH EYE DROP TEARS OP) Apply 1-2 drops to eye daily.   Yes [provider]  Iron TABS Take 1 tablet by mouth daily.   Yes [provider]  magnesium chloride (SLOW-MAG) 64 MG TBEC SR tablet Take 1 tablet  by mouth daily.   Yes [provider]  Multiple Vitamin (MULTIVITAMIN WITH MINERALS) TABS tablet Take 1 tablet by mouth daily.   Yes [provider]  potassium chloride (K-DUR) 10 MEQ tablet Take 10 mEq by mouth daily.    Yes [provider]  sodium chloride (MURO 128) 5 % ophthalmic solution Place 1-2 drops into both eyes as needed for eye irritation.   Yes [provider]    Family History Family History  Problem Relation Age of Onset  . Lung cancer Brother        NEVER SMOKED  . Cancer Mother   . Healthy Father   . Healthy Sister   . Lung cancer Brother        NEVER SMOKED  . Lung cancer Brother        NEVER SMOKED  . Healthy Brother   . Healthy Brother     Social History Social History  Substance Use Topics  . Smoking status: Former Smoker    Packs/day: 0.50    Years: 36.00    Types: Cigarettes    Quit date: 01/25/1977  . Smokeless tobacco: Never Used  . Alcohol use No     Allergies   Tylenol 8 hour [acetaminophen]; Aspirin; and Penicillins   Review of Systems Review of Systems  Cardiovascular: Positive for chest pain.  Gastrointestinal: Positive for blood in stool. Negative for abdominal pain.  All other systems reviewed and are negative.    Physical Exam Updated Vital Signs BP (!) 124/53 (BP Location: Right Arm)   Pulse 73   Temp 97.7 F (36.5 C) (Oral)   Resp 17   Ht _0  (1.626 m)   Wt 53.2 kg (117 lb 4.6 oz)   SpO2 93%   BMI 20.13 kg/m   Physical Exam  Constitutional: She is oriented to person, place, and time. She appears well-developed and well-nourished.  HENT:  Head: Normocephalic and atraumatic.  Eyes: Pupils are equal, round, and reactive to light. EOM are normal.  Neck: Neck supple.  Cardiovascular: Normal rate, regular rhythm, normal heart sounds and intact distal pulses.   Pulmonary/Chest: Effort normal. No respiratory distress. She exhibits no tenderness.  Abdominal: Soft. She exhibits no  distension. There is no tenderness. There is no rebound and no guarding.  Neurological: She is alert and oriented to person, place, and time.  Skin: Skin is warm and dry.  Nursing [name] and vitals reviewed.    ED Treatments / Results  Labs (all labs ordered are listed, but only abnormal results are displayed) Labs Reviewed  CBC WITH DIFFERENTIAL/PLATELET - Abnormal; Notable for the following:       Result Value   RBC 3.66 (*)    Hemoglobin 10.6 (*)  HCT 32.1 (*)    All other components within normal limits  COMPREHENSIVE METABOLIC PANEL - Abnormal; Notable for the following:    Sodium 133 (*)    BUN 31 (*)    Creatinine, Ser 1.50 (*)    Albumin 3.2 (*)    ALT 10 (*)    GFR calc non Af Amer 29 (*)    GFR calc Af Amer 34 (*)    All other components within normal limits  TROPONIN I - Abnormal; Notable for the following:    Troponin I 0.89 (*)    All other components within normal limits  D-DIMER, QUANTITATIVE (NOT AT [facility]) - Abnormal; Notable for the following:    D-Dimer, Quant 7.13 (*)    All other components within normal limits  BASIC METABOLIC PANEL - Abnormal; Notable for the following:    Sodium 134 (*)    BUN 29 (*)    Creatinine, Ser 1.34 (*)    GFR calc non Af Amer 34 (*)    GFR calc Af Amer 39 (*)    All other components within normal limits  CBC - Abnormal; Notable for the following:    RBC 3.46 (*)    Hemoglobin 10.2 (*)    HCT 30.1 (*)    All other components within normal limits  TROPONIN I - Abnormal; Notable for the following:    Troponin I 0.38 (*)    All other components within normal limits  TROPONIN I - Abnormal; Notable for the following:    Troponin I 0.70 (*)    All other components within normal limits  POC OCCULT BLOOD, ED    EKG  EKG Interpretation  Date/Time:  Wednesday September 15 2016 18:56:48 EDT Ventricular Rate:  78 PR Interval:    QRS Duration: 81 QT Interval:  433 QTC Calculation: 494 R Axis:   -13 Text Interpretation:   Sinus rhythm Prolonged PR interval Abnormal R-wave progression, early transition Abnormal T, probable ischemia, widespread No acute changes No significant change since last tracing Confirmed by Varney Biles [phone]) on 09/15/2016 7:20:24 PM Also confirmed by Varney Biles [phone]), editor Hattie Perch ([id number])  on 09/16/2016 7:02:45 AM       Radiology Dg Chest 2 View  Result Date: 09/15/2016 CLINICAL DATA:  Chest pain. EXAM: CHEST  2 VIEW COMPARISON:  Radiograph August 20, 2014. FINDINGS: Stable cardiomediastinal silhouette. Atherosclerosis of thoracic aorta is noted. No pneumothorax or pleural effusion is noted. Left midlung opacity is noted concerning for possible pneumonia or inflammation. Multilevel degenerative changes are noted in the thoracic spine. IMPRESSION: Focal left midlung opacity is noted concerning for pneumonia for atelectasis. Followup PA and lateral chest X-ray is recommended in 3-4 weeks following trial of antibiotic therapy to ensure resolution and exclude underlying malignancy. Electronically Signed   By: Marijo Conception, M.D.   On: 09/15/2016 19:30   Nm Pulmonary Perf And Vent  Result Date: 09/16/2016 CLINICAL DATA:  Chest pain with elevated D-dimer EXAM: NUCLEAR MEDICINE VENTILATION - PERFUSION LUNG SCAN VIEWS: Anterior, posterior, left lateral, right lateral, RPO, LPO, RAO, LAO -ventilation and profusion RADIOPHARMACEUTICALS:  32.7 mCi Technetium-56mDTPA aerosol inhalation and 3.8 mCi Technetium-941mAA IV COMPARISON:  Chest radiograph September 15, 2016. FINDINGS: Ventilation: There is no segmental or significant subsegmental ventilation defect. A few rather minimal areas of inhomogeneity are noted on the ventilation study, likely due to what appears to be a degree of underlying COPD. Perfusion: There is no segmental or significant subsegmental perfusion defect. There  are a few rather tiny matching ventilation and profusion defects, likely due to underlying COPD. No  significant ventilation/ perfusion mismatch. IMPRESSION: Findings suggesting a degree of underlying COPD. No appreciable ventilation/ perfusion mismatch. No segmental or significant subsegmental perfusion defects. This study constitutes a low probability of pulmonary embolus. Electronically Signed   By: Lowella Grip III M.D.   On: 09/16/2016 11:49    Procedures Procedures (including critical care time)  CRITICAL CARE Performed by: Varney Biles   Total critical care time: 48 minutes  Critical care time was exclusive of separately billable procedures and treating other patients.  Critical care was necessary to treat or prevent imminent or life-threatening deterioration.  Critical care was time spent personally by me on the following activities: development of treatment plan with patient and/or surrogate as well as nursing, discussions with consultants, evaluation of patient's response to treatment, examination of patient, obtaining history from patient or surrogate, ordering and performing treatments and interventions, ordering and review of laboratory studies, ordering and review of radiographic studies, pulse oximetry and re-evaluation of patient's condition.   Medications Ordered in ED Medications  nitroGLYCERIN (NITROSTAT) SL tablet 0.4 mg (not administered)  ondansetron (ZOFRAN) injection 4 mg (not administered)  folic acid (FOLVITE) tablet 1 mg (1 mg Oral Given 09/16/16 1246)  furosemide (LASIX) tablet 20 mg (20 mg Oral Given 09/16/16 1246)  magnesium chloride (SLOW-MAG) 64 MG SR tablet 64 mg (64 mg Oral Given 09/16/16 1246)  potassium chloride (K-DUR) CR tablet 10 mEq (10 mEq Oral Given 09/16/16 1245)  rosuvastatin (CRESTOR) tablet 40 mg (40 mg Oral Given 09/15/16 2252)  morphine 2 MG/ML injection 2 mg (not administered)  aspirin chewable tablet 81 mg (81 mg Oral Given 09/16/16 1246)  heparin injection 5,000 Units (not administered)  lactose free nutrition (BOOST PLUS) liquid  237 mL (not administered)  multivitamin with minerals tablet 1 tablet (not administered)  gi cocktail (Maalox,Lidocaine,Donnatal) (30 mLs Oral Given 09/15/16 1804)  clopidogrel (PLAVIX) tablet 300 mg (300 mg Oral Given 09/15/16 2252)  technetium TC 24M diethylenetriame-pentaacetic acid (DTPA) injection 26.7 millicurie (12.4 millicuries Intravenous Given 09/16/16 1051)  technetium albumin aggregated (MAA) injection solution 3.8 millicurie (3.8 millicuries Intravenous Contrast Given 09/16/16 1116)     Initial Impression / Assessment and Plan / ED Course  I have reviewed the triage vital signs and the nursing notes.  Pertinent labs & imaging results that were available during my care of the patient were reviewed by me and considered in my medical decision making (see chart for details).     Differential diagnosis includes: ACS syndrome Aortic dissection Myocarditis PE Pneumothorax Musculoskeletal pain PUD / Gastritis / Esophagitis Esophageal spasm PUD/Gastritis/ulcers Diverticular bleed Rectal bleed Internal hemorrhoids  Pt's chest pain is atypical for ACS. Given her age and known non obstructive CAD per cath within the last 2 years - we will ekg and trops 2.  Pt has no melena on exam. Hemoccult neg. Labs ordered.   Later on in the stay trop came back elevated. I spoke with Dr. Loletha Grayer, Cardiology. He agreed conservative measures for now. We will get dimer, and hospitalist will f/u.   Final Clinical Impressions(s) / ED Diagnoses   Final diagnoses:  NSTEMI (non-ST elevated myocardial infarction) [facility])    New Prescriptions Current Discharge Medication List       Varney Biles, MD 09/15/16 Epes, Williams, MD 09/16/16 1545

## 2016-09-15 NOTE — ED Triage Notes (Signed)
Per EMS, patient c/o burning sensation in her chest and under her breasts. Patient does have a history of heart burn but this time has had no relief. Pt also c/o constipation and took a laxative this morning and had 2 black BMs. Patient has a history of GI surgery where they "cut her intestines". A&Ox4

## 2016-09-15 NOTE — ED Notes (Signed)
Please call report to [facility] RN at [phone] at [phone].

## 2016-09-15 NOTE — ED Notes (Signed)
Bed: WA17 Expected date:  Expected time:  Means of arrival:  Comments: EMS "heart burn"

## 2016-09-15 NOTE — ED Notes (Signed)
ED Provider at bedside. 

## 2016-09-15 NOTE — ED Notes (Signed)
Pt's son called for an update and asked for his mother to call him. Phone number was given to pt, who stated she would call him later

## 2016-09-16 ENCOUNTER — Inpatient Hospital Stay (HOSPITAL_COMMUNITY): Payer: Medicare Other

## 2016-09-16 DIAGNOSIS — I10 Essential (primary) hypertension: Secondary | ICD-10-CM

## 2016-09-16 DIAGNOSIS — I361 Nonrheumatic tricuspid (valve) insufficiency: Secondary | ICD-10-CM

## 2016-09-16 DIAGNOSIS — I5041 Acute combined systolic (congestive) and diastolic (congestive) heart failure: Secondary | ICD-10-CM

## 2016-09-16 DIAGNOSIS — I214 Non-ST elevation (NSTEMI) myocardial infarction: Principal | ICD-10-CM

## 2016-09-16 DIAGNOSIS — R7989 Other specified abnormal findings of blood chemistry: Secondary | ICD-10-CM

## 2016-09-16 LAB — BASIC METABOLIC PANEL
ANION GAP: 8 (ref 5–15)
BUN: 29 mg/dL — ABNORMAL HIGH (ref 6–20)
CALCIUM: 8.9 mg/dL (ref 8.9–10.3)
CHLORIDE: 104 mmol/L (ref 101–111)
CO2: 22 mmol/L (ref 22–32)
CREATININE: 1.34 mg/dL — AB (ref 0.44–1.00)
GFR calc non Af Amer: 34 mL/min — ABNORMAL LOW (ref >60)
GFR, EST AFRICAN AMERICAN: 39 mL/min — AB (ref >60)
Glucose, Bld: 89 mg/dL (ref 65–99)
Potassium: 4.1 mmol/L (ref 3.5–5.1)
SODIUM: 134 mmol/L — AB (ref 135–145)

## 2016-09-16 LAB — TROPONIN I
TROPONIN I: 0.7 ng/mL — AB (ref <0.03)
TROPONIN I: 0.38 ng/mL — AB (ref <0.03)

## 2016-09-16 LAB — CBC
HCT: 30.1 % — ABNORMAL LOW (ref 36.0–46.0)
HEMOGLOBIN: 10.2 g/dL — AB (ref 12.0–15.0)
MCH: 29.5 pg (ref 26.0–34.0)
MCHC: 33.9 g/dL (ref 30.0–36.0)
MCV: 87 fL (ref 78.0–100.0)
PLATELETS: 245 10*3/uL (ref 150–400)
RBC: 3.46 MIL/uL — AB (ref 3.87–5.11)
RDW: 14.4 % (ref 11.5–15.5)
WBC: 7 10*3/uL (ref 4.0–10.5)

## 2016-09-16 LAB — ECHOCARDIOGRAM COMPLETE
HEIGHTINCHES: 64 in
WEIGHTICAEL: 1876.56 [oz_av]

## 2016-09-16 MED ORDER — HEPARIN SODIUM (PORCINE) 5000 UNIT/ML IJ SOLN
5000.0000 [IU] | Freq: Three times a day (TID) | INTRAMUSCULAR | Status: DC
  Administered 2016-09-16 – 2016-09-24 (×23): 5000 [IU] via SUBCUTANEOUS
  Filled 2016-09-16 (×24): qty 1

## 2016-09-16 MED ORDER — HEPARIN (PORCINE) IN NACL 100-0.45 UNIT/ML-% IJ SOLN: 850.0000 [IU]/h | INTRAMUSCULAR | Status: DC

## 2016-09-16 MED ORDER — TECHNETIUM TO 99M ALBUMIN AGGREGATED
3.8000 | Freq: Once | INTRAVENOUS | Status: AC | PRN
  Administered 2016-09-16: 3.8 via INTRAVENOUS

## 2016-09-16 MED ORDER — TECHNETIUM TC 99M DIETHYLENETRIAME-PENTAACETIC ACID
32.7000 | Freq: Once | INTRAVENOUS | Status: AC | PRN
  Administered 2016-09-16: 32.7 via INTRAVENOUS

## 2016-09-16 MED ORDER — BOOST PLUS PO LIQD
237.0000 mL | Freq: Two times a day (BID) | ORAL | Status: DC
  Administered 2016-09-16 – 2016-09-21 (×8): 237 mL via ORAL
  Filled 2016-09-16 (×11): qty 237

## 2016-09-16 MED ORDER — TRAMADOL HCL 50 MG PO TABS
50.0000 mg | ORAL_TABLET | Freq: Two times a day (BID) | ORAL | Status: DC | PRN
  Administered 2016-09-21 – 2016-09-25 (×4): 50 mg via ORAL
  Filled 2016-09-16 (×5): qty 1

## 2016-09-16 MED ORDER — ADULT MULTIVITAMIN W/MINERALS CH
1.0000 | ORAL_TABLET | Freq: Every day | ORAL | Status: DC
  Administered 2016-09-16 – 2016-09-23 (×6): 1 via ORAL
  Filled 2016-09-16 (×7): qty 1

## 2016-09-16 NOTE — Progress Notes (Signed)
Initial Nutrition Assessment  DOCUMENTATION CODES:   Severe malnutrition in context of chronic illness  INTERVENTION:  - Will order Boost Plus TID, each supplement provides 360 kcal and 14 grams of protein. - Will order daily multivitamin with minerals.  - Continue to encourage PO intakes of meals and supplements.   NUTRITION DIAGNOSIS:   Malnutrition (severe) related to chronic illness (CAD, CKD, advanced age) as evidenced by severe depletion of muscle mass, severe depletion of body fat.  GOAL:   Patient will meet greater than or equal to 90% of their needs  MONITOR:   PO intake, Supplement acceptance, Weight trends, Labs, I & O's  REASON FOR ASSESSMENT:   Malnutrition Screening Tool, Consult Assessment of nutrition requirement/status  ASSESSMENT:   83 yo female with hx of CKD, CAD who came with cc of dark stool and chest pain. She said she was in her usual state of health until yesterday when she had chest pain across her chest, worsen with breathing, non radiating, moderate, w/o dyspnea/cough/fever/chills along with constipation. She took a stool softener and went to bed but later had two dark BMs which got her worried that she may have had bloody stool which she never had before. She had mild abdominal discomfort w/o nausea/vomiting/diarrhea.  Pt seen for MST and consult. BMI indicates normal weight. Diet was advanced from NPO to Heart Healthy today at [phone]. Visualized lunch tray with a few small bites of pancake and ~50% of a bowl of soup consumed. Pt states that she was not feeling very hungry. She requested coffee with 2 packets of sugar and 2 creamers; provided this for pt. She states that she has had a decreased appetite for >/=1 year as she has gotten older. She often makes items such as chicken noodle and vegetable soup and drinks 2 bottles of chocolate Boost/day; provided her with coupons for when she leaves the hospital. She also enjoys items such as Nutrigrain bars. Pt  states that she was at a funeral on 8/21 and did not eat anything that day and that lunch today is the first thing she can recall having since Monday night. She denies abdominal pain or nausea but states she is having tightness/pressure along the side of her neck.   Physical assessment shows moderate and severe muscle wasting and moderate and severe fat wasting. Pt reports she has been losing weight over the past few months but is unsure of amount of weight lost. Last weight in chart prior to this admission is from 01/15/15 when she weighed 130 lbs. This indicates 13 lb weight loss (10% body weight) in the past 20 months which is not significant for time frame.   Medications reviewed; 1 mg folic acid/day, 20 mg oral Lasix/day, 30 mL Maalox x1 dose yesterday, 1 tablet Slow-mag/day, 10 mEq oral KCl every other day starting today. Labs reviewed; Na: 134 mmol/L, BUN: 29 mg/dL, creatinine: 9.60 mg/dL, GFR: 39 mL/min.     Diet Order:  Diet Heart Room service appropriate? Yes; Fluid consistency: Thin Diet NPO time specified Except for: Sips with Meds  Skin:  Reviewed, no issues  Last BM:  8/22  Height:   Ht Readings from Last 1 Encounters:  09/15/16 5\' 4"  (1.626 m)    Weight:   Wt Readings from Last 1 Encounters:  09/15/16 117 lb 4.6 oz (53.2 kg)    Ideal Body Weight:  54.54 kg  BMI:  Body mass index is 20.13 kg/m.  Estimated Nutritional Needs:   Kcal:  4540-9811 (  23-27 kcal/kg)  Protein:  53-63 grams (1-1.2 grams/kg)  Fluid:  1.2-1.4 L/day  EDUCATION NEEDS:   No education needs identified at this time    Trenton Gammon, MS, RD, LDN, CNSC Inpatient Clinical Dietitian Pager # [phone] After hours/weekend pager # [phone]

## 2016-09-16 NOTE — Progress Notes (Addendum)
ANTICOAGULATION CONSULT NOTE - Initial Consult  Pharmacy Consult for heparin Indication: rule out PE/DVT  Allergies  Allergen Reactions  . Tylenol 8 Hour [Acetaminophen] Other (See Comments)    GI Upset  . Aspirin Nausea And Vomiting    GI Upset  . Penicillins Rash    Patient Measurements: Height: _0  (162.6 cm) Weight: 117 lb 4.6 oz (53.2 kg) IBW/kg (Calculated) : 54.7 Heparin Dosing Weight: 53.2 kg  Vital Signs: Temp: 98.4 F (36.9 C) (08/23 0406) Temp Source: Oral (08/23 0406) BP: 104/49 (08/23 0406) Pulse Rate: 69 (08/23 0406)  Labs:  Recent Labs  09/15/16 1809 09/16/16 0129  HGB 10.6* 10.2*  HCT 32.1* 30.1*  PLT 254 245  CREATININE 1.50* 1.34*  TROPONINI 0.89* 0.70*    Estimated Creatinine Clearance: 23 mL/min (A) (by C-G formula based on SCr of 1.34 mg/dL (H)).   Medical History: Past Medical History:  Diagnosis Date  . Anemia   . Arthritis   . CAD (coronary artery disease)    a.  LHC 7/16:  prox to mid LAD 30, OM1 30, OM3 30, pRCA 40  . Carotid stenosis    a.  Carotid US 7/16:  bilat ICA 1-39%  . Cervical spinal stenosis   . Chronic constipation   . CKD (chronic kidney disease)   . H/O Legionnaire's disease   . Hyperlipidemia   . Hypertension   . Peripheral edema   . Pneumonia   . Postinflammatory pulmonary fibrosis ([place])   . Takotsubo cardiomyopathy 07/2014   NSTEMI 07/2014 >>   a. Echo 7/16:  apical AK c/w stress induced CM, EF 40%, no clot, mod TR, PASP 38 mmHg;  b. Limited echo 9/16: EF 55-60%, normal wall motion, grade 1 diastolic dysfunction, MAC, mild LAE, normal RV function, mild RAE, PASP 38  . Upper airway cough syndrome     Assessment: 35 YOF presents with chest pain. Found to have elevated troponin and d-dimer.  Start heparin gtt for  rule out VTE.  Cardiology suspects Takotsubo and not ACS.   Today, 09/16/2016  CBC: Hgb low at 10.2, pltc WNL  Renal: SCr elevated at 1.34 but improving  Goal of Therapy:  Heparin level  0.3-0.7 units/ml Monitor platelets by anticoagulation protocol: Yes   Plan:   Heparin 850 units/hr (no bolus for recent plavix 330m load and heparin 5000 unit SQ given this am)  Follow-up 8h heparin level  Daily Heparin level and CBC  F/u VQ and LE dopplers  DDoreene Eland PharmD, BCPS.   Pager: [phone]/23/2018 10:03 AM  Addendum VQ scan reveals low probability of PE  Plan:  Ok to stop heparin gtt orders per physician  Will resume heparin 5000 units SQ TID since heparin infusion never initiated.   DDoreene Eland PharmD, BCPS.   Pager: [phone]/23/2018 12:12 PM

## 2016-09-16 NOTE — Consult Note (Signed)
Cardiology Consultation:   Patient ID: Alicia [name]; [id number]; Apr 27, 1922   Admit date: 09/15/2016 Date of Consult: 09/16/2016  Primary Care Provider: Wenda Low, MD Primary Cardiologist: Dr. Peter Martinique Primary Electrophysiologist:  n/a   Patient Profile:   Alicia [name] is a 83 y.o. female with a hx of pulmonary fibrosis, anemia, HLD, HTN, tobacco and Takotsubo CM in 2016 use who is being seen today for the evaluation of chest pain, NSTEMI at the request of Dr. Lonny Prude.  History of Present Illness:   [name] developed chest discomfort across the upper chest on Tuesday night. She had trouble sleeping. She denies shortness of breath, orthopnea, PND, palpitations, dizziness. She had the discomfort all day yesterday. She also had stomach discomfort saying the whole front of my body was uncomfortable. She has been unable to eat. Currently she has no pain. She does have left lower leg and knee edema. She says that she has chronic lower ext edema, L>R, but this is better now than it has been in the past.   was hospitalized for right arm weakness in 07/2014 and developed chest pain with elevated troponins peaking at 1.01. LHC demonstrated mild non-obstructive CAD. Echo demonstrated apical akinesis consistent with stress induced cardiomyopathy (Takotsubo cardiomyopathy) with EF 40%. Follow up echo in 09/2014 demonstrated LVEF 55-60%. She was last seen in our office on 01/15/2015 by Richardson Dopp, PA at which time she was asymptomatic except for bilateral ankle edema. The patient had stopped beta blocker on her own due to intolerance. ACE-I/ARB was not started due to advanced age and renal function. Her heart function improved without these measures.   Significant findings: Troponins:  0.89, 0.70 SCr 1.50> 1.34 K+ 4.7 Hgb 10.6,   WBC 7.5 D-dimer 7.13 CXR: Focal left midlung opacity is noted concerning for pneumonia for atelectasis. Followup PA and lateral chest X-ray is recommended  in 3-4 weeks following trial of antibiotic therapy to ensure resolution and exclude underlying malignancy.   Past Medical History:  Diagnosis Date  . Anemia   . Arthritis   . CAD (coronary artery disease)    a.  LHC 7/16:  prox to mid LAD 30, OM1 30, OM3 30, pRCA 40  . Carotid stenosis    a.  Carotid US 7/16:  bilat ICA 1-39%  . Cervical spinal stenosis   . Chronic constipation   . CKD (chronic kidney disease)   . H/O Legionnaire's disease   . Hyperlipidemia   . Hypertension   . Peripheral edema   . Pneumonia   . Postinflammatory pulmonary fibrosis ([place])   . Takotsubo cardiomyopathy 07/2014   NSTEMI 07/2014 >>   a. Echo 7/16:  apical AK c/w stress induced CM, EF 40%, no clot, mod TR, PASP 38 mmHg;  b. Limited echo 9/16: EF 55-60%, normal wall motion, grade 1 diastolic dysfunction, MAC, mild LAE, normal RV function, mild RAE, PASP 38  . Upper airway cough syndrome     Past Surgical History:  Procedure Laterality Date  . ABDOMINAL HYSTERECTOMY    . CARDIAC CATHETERIZATION N/A 08/16/2014   Procedure: Left Heart Cath and Coronary Angiography;  Surgeon: Jolaine Artist, MD;  Location: [facility];  Service: Cardiovascular;  Laterality: N/A;  . CHOLECYSTECTOMY         Inpatient Medications: Scheduled Meds: . aspirin  81 mg Oral Daily  . folic acid  1 mg Oral Daily  . furosemide  20 mg Oral QODAY  . heparin  5,000 Units Subcutaneous  Q8H  . magnesium chloride  1 tablet Oral Daily  . potassium chloride  10 mEq Oral QODAY  . rosuvastatin  40 mg Oral q1800   Continuous Infusions:  PRN Meds: morphine injection, nitroGLYCERIN, ondansetron (ZOFRAN) IV  Allergies:    Allergies  Allergen Reactions  . Tylenol 8 Hour [Acetaminophen] Other (See Comments)    GI Upset  . Aspirin Nausea And Vomiting    GI Upset  . Penicillins Rash    Social History:   Social History   Social History  . Marital status: Married    Spouse name: N/A  . Number of children: N/A  .  Years of education: N/A   Occupational History  . Not on file.   Social History Main Topics  . Smoking status: Former Smoker    Packs/day: 0.50    Years: 36.00    Types: Cigarettes    Quit date: 01/25/1977  . Smokeless tobacco: Never Used  . Alcohol use No  . Drug use: No  . Sexual activity: Not on file   Other Topics Concern  . Not on file   Social History Narrative  . No narrative on file    Family History:    Family History  Problem Relation Age of Onset  . Lung cancer Brother        NEVER SMOKED  . Cancer Mother   . Healthy Father   . Healthy Sister   . Lung cancer Brother        NEVER SMOKED  . Lung cancer Brother        NEVER SMOKED  . Healthy Brother   . Healthy Brother      ROS:  Please see the history of present illness.  ROS  All other ROS reviewed and negative.     Physical Exam/Data:   Vitals:   09/15/16 1846 09/15/16 1946 09/15/16 2146 09/16/16 0406  BP:  118/77 121/65 (!) 104/49  Pulse:  78 78 69  Resp:  _0 Temp:   97.7 F (36.5 C) 98.4 F (36.9 C)  TempSrc:   Oral Oral  SpO2: 98% 90% 91% 96%  Weight:   117 lb 4.6 oz (53.2 kg)   Height:   _1  (1.626 m)     Intake/Output Summary (Last 24 hours) at 09/16/16 0800 Last data filed at 09/15/16 2355  Gross per 24 hour  Intake              250 ml  Output                0 ml  Net              250 ml   Filed Weights   09/15/16 1634 09/15/16 2146  Weight: 121 lb (54.9 kg) 117 lb 4.6 oz (53.2 kg)   Body mass index is 20.13 kg/m.  General:  Well nourished, well developed, elderly female in no acute distress HEENT: normal Lymph: no adenopathy Neck: no JVD Endocrine:  No thryomegaly Vascular: No carotid bruits; FA pulses 2+ bilaterally without bruits  Cardiac:  normal S1, S2; RRR; no murmur  Lungs:  clear to auscultation bilaterally, no wheezing, rhonchi or rales  Abd: soft, nontender, no hepatomegaly  Ext: Mild non-pitting edema of left lower leg and knee edema, trace edema of  right lower leg Musculoskeletal:  No deformities, BUE and BLE strength normal and equal Skin: warm and dry  Neuro:  CNs 2-12 intact, no focal abnormalities noted Psych:  Normal affect   EKG:  The EKG was personally reviewed and demonstrates:  Sinus rhythm at 78 bpm with diffuse TWI, early r wave progression Telemetry:  Telemetry was personally reviewed and demonstrates:  NSR in the 60's  Relevant CV Studies:  Cardiac catheterization 08/16/2014  Prox RCA lesion, 40% stenosed.  1st Mrg lesion, 30% stenosed.  3rd Mrg lesion, 30% stenosed.  Prox LAD to Mid LAD lesion, 30% stenosed.   Assessment: 1) Left dominant system with separate ostia for LAD and LCX 2) Mild non-obstructive CAD   Echocardiogram 10/09/14:  EF 55-60%, normal wall motion, grade 1 diastolic dysfunction, MAC, mild LAE, normal RV function, PASP 38 mmHg  Echocardiogram 08/16/14: Mild focal basal septal hypertrophy, grade 1 diastolic dysfunction, EF 76%, akinesis in all the apical segments-Tako-Tsubo cardiomyopathy pattern, moderate TR  Carotid US 07/2014: bilat ICA 1-39%  Laboratory Data:  Chemistry Recent Labs Lab 09/15/16 1809 09/16/16 0129  NA 133* 134*  K 4.7 4.1  CL 102 104  CO2 25 22  GLUCOSE 97 89  BUN 31* 29*  CREATININE 1.50* 1.34*  CALCIUM 9.2 8.9  GFRNONAA 29* 34*  GFRAA 34* 39*  ANIONGAP 6 8     Recent Labs Lab 09/15/16 1809  PROT 6.9  ALBUMIN 3.2*  AST 28  ALT 10*  ALKPHOS 60  BILITOT 0.9   Hematology Recent Labs Lab 09/15/16 1809 09/16/16 0129  WBC 7.5 7.0  RBC 3.66* 3.46*  HGB 10.6* 10.2*  HCT 32.1* 30.1*  MCV 87.7 87.0  MCH 29.0 29.5  MCHC 33.0 33.9  RDW 14.3 14.4  PLT 254 245   Cardiac Enzymes Recent Labs Lab 09/15/16 1809 09/16/16 0129  TROPONINI 0.89* 0.70*   No results for input(s): TROPIPOC in the last 168 hours.  BNPNo results for input(s): BNP, PROBNP in the last 168 hours.  DDimer  Recent Labs Lab 09/15/16 2019  DDIMER 7.13*    Radiology/Studies:   Dg Chest 2 View  Result Date: 09/15/2016 CLINICAL DATA:  Chest pain. EXAM: CHEST  2 VIEW COMPARISON:  Radiograph August 20, 2014. FINDINGS: Stable cardiomediastinal silhouette. Atherosclerosis of thoracic aorta is noted. No pneumothorax or pleural effusion is noted. Left midlung opacity is noted concerning for possible pneumonia or inflammation. Multilevel degenerative changes are noted in the thoracic spine. IMPRESSION: Focal left midlung opacity is noted concerning for pneumonia for atelectasis. Followup PA and lateral chest X-ray is recommended in 3-4 weeks following trial of antibiotic therapy to ensure resolution and exclude underlying malignancy. Electronically Signed   By: Marijo Conception, M.D.   On: 09/15/2016 19:30    Assessment and Plan:   Chest pain -Pt has hx of Takotsubo CM with elevated troponins (peak 1.01) in 07/2014. LHC at that time demonstrated mild non-obstructive disease. EF was 40% but recovered to 55-60%. Pt did not tolerate BB. ACE-I/ARB not added due to age and renal function.  -Troponins: 0.89, 0.70 -EKG shows diffuse T wave inversions similar to when she had Takotsubo in 2016, that later resolved with normalization of T waves -She is continued on her aspirin. Plavix 300 mg was given. Heparin not started as pt is chest pain free.  -Echo is ordered.  -Elevated D-dimer : 7.13. EKG without S1Q3T3, however, TWI in anterior leads (these are actually in all leads), possible RV strain. No tachycardia. Pt also has unilateral edema of the RLE, which apparently is chronic. Will order VQ scan (no PE CT due to renal function). Will start Heparin pending scan and discontinue if negative.  Further management per IM.  -Suspect either PE or takostubo or possibly both. Do not suspect ACS.   CKD -SCr 1.5 > 1.34.  At baseline  Anemia -Hgb 10.6. Pt has chronic anemia  Hyperlipidemia -Last lipid panel in 07/2014: LDL 75 -Continue statin. Will update lipid panel  Signed, Daune Perch,  NP  09/16/2016 8:00 AM

## 2016-09-16 NOTE — Progress Notes (Signed)
  Echocardiogram 2D Echocardiogram has been performed.  Cheryn Lundquist T Shalik Sanfilippo 09/16/2016, 10:10 AM

## 2016-09-16 NOTE — Progress Notes (Signed)
PROGRESS NOTE    Alicia [name]  ZOX:[id number] DOB: 1922-10-28 DOA: 09/15/2016 PCP: [name], MD   Brief Narrative: Alicia [name] is a 83 y.o. female with a history of Takotsubo gammopathy, hyperlipidemia, CKD, tobacco abuse, CAD. She presented with chest pain and elevated troponin. Etiology consulted to help with management of possible NSTEMI   Assessment & Plan:   Active Problems:   NSTEMI (non-ST elevated myocardial infarction) (HCC)   NSTEMI Troponin elevated with the peak of 0.89 in setting of chest pain. Decreased T waves. No current chest pain. -Continue aspirin -Echocardiogram pending -Cardiology recommendations:  Elevated d-dimer Low suspicion for PE. -VQ scan ordered  CKD Baseline  Anemia Normocytic. Stable. Patient reports dark stools on admission. No hematochezia. Fecal occult blood test negative.  Hyperlipidemia Not on statin. Last LDL was 75 in 2016  Decreased by mouth intake  -Dietitian consult   DVT prophylaxis: Heparin subq Code Status: Full code Family Communication: Cousin at bedside Disposition Plan: Discharge in 24-48 hours pending cardiac workup   Consultants:   Cardiology  Procedures:   Echocardiogram (8/23)  Antimicrobials:  None    Subjective: Some shoulder pain. No dyspnea or chest pain.  Objective: Vitals:   09/15/16 1946 09/15/16 2146 09/16/16 0406 09/16/16 1235  BP: 118/77 121/65 (!) 104/49 (!) 124/53  Pulse: 78 78 69 73  Resp: 20 20 18 17   Temp:  97.7 F (36.5 C) 98.4 F (36.9 C) 97.7 F (36.5 C)  TempSrc:  Oral Oral Oral  SpO2: 90% 91% 96% 93%  Weight:  53.2 kg (117 lb 4.6 oz)    Height:  5\' 4"  (1.626 m)      Intake/Output Summary (Last 24 hours) at 09/16/16 1341 Last data filed at 09/16/16 1007  Gross per 24 hour  Intake              250 ml  Output                0 ml  Net              250 ml   Filed Weights   09/15/16 1634 09/15/16 2146  Weight: 54.9 kg (121 lb) 53.2 kg (117 lb 4.6 oz)      Examination:  General exam: Appears calm and comfortable  Respiratory system: Clear to auscultation. Respiratory effort normal. Cardiovascular system: S1 & S2 heard, RRR. No murmurs, rubs, gallops or clicks. Gastrointestinal system: Abdomen is nondistended, soft and nontender. Normal bowel sounds heard. Central nervous system: Alert and oriented. No focal neurological deficits. Extremities: No edema. No calf tenderness Skin: No cyanosis. No rashes Psychiatry: Judgement and insight appear normal. Mood & affect appropriate.     Data Reviewed: I have personally reviewed following labs and imaging studies  CBC:  Recent Labs Lab 09/15/16 1809 09/16/16 0129  WBC 7.5 7.0  NEUTROABS 6.0  --   HGB 10.6* 10.2*  HCT 32.1* 30.1*  MCV 87.7 87.0  PLT 254 245   Basic Metabolic Panel:  Recent Labs Lab 09/15/16 1809 09/16/16 0129  NA 133* 134*  K 4.7 4.1  CL 102 104  CO2 25 22  GLUCOSE 97 89  BUN 31* 29*  CREATININE 1.50* 1.34*  CALCIUM 9.2 8.9   GFR: Estimated Creatinine Clearance: 23 mL/min (A) (by C-G formula based on SCr of 1.34 mg/dL (H)). Liver Function Tests:  Recent Labs Lab 09/15/16 1809  AST 28  ALT 10*  ALKPHOS 60  BILITOT 0.9  PROT 6.9  ALBUMIN  3.2*   No results for input(s): LIPASE, AMYLASE in the last 168 hours. No results for input(s): AMMONIA in the last 168 hours. Coagulation Profile: No results for input(s): INR, PROTIME in the last 168 hours. Cardiac Enzymes:  Recent Labs Lab 09/15/16 1809 09/16/16 0129 09/16/16 1211  TROPONINI 0.89* 0.70* 0.38*   BNP (last 3 results) No results for input(s): PROBNP in the last 8760 hours. HbA1C: No results for input(s): HGBA1C in the last 72 hours. CBG: No results for input(s): GLUCAP in the last 168 hours. Lipid Profile: No results for input(s): CHOL, HDL, LDLCALC, TRIG, CHOLHDL, LDLDIRECT in the last 72 hours. Thyroid Function Tests: No results for input(s): TSH, T4TOTAL, FREET4, T3FREE,  THYROIDAB in the last 72 hours. Anemia Panel: No results for input(s): VITAMINB12, FOLATE, FERRITIN, TIBC, IRON, RETICCTPCT in the last 72 hours. Sepsis Labs: No results for input(s): PROCALCITON, LATICACIDVEN in the last 168 hours.  No results found for this or any previous visit (from the past 240 hour(s)).       Radiology Studies: Dg Chest 2 View  Result Date: 09/15/2016 CLINICAL DATA:  Chest pain. EXAM: CHEST  2 VIEW COMPARISON:  Radiograph August 20, 2014. FINDINGS: Stable cardiomediastinal silhouette. Atherosclerosis of thoracic aorta is noted. No pneumothorax or pleural effusion is noted. Left midlung opacity is noted concerning for possible pneumonia or inflammation. Multilevel degenerative changes are noted in the thoracic spine. IMPRESSION: Focal left midlung opacity is noted concerning for pneumonia for atelectasis. Followup PA and lateral chest X-ray is recommended in 3-4 weeks following trial of antibiotic therapy to ensure resolution and exclude underlying malignancy. Electronically Signed   By: Lupita Raider, M.D.   On: 09/15/2016 19:30   Nm Pulmonary Perf And Vent  Result Date: 09/16/2016 CLINICAL DATA:  Chest pain with elevated D-dimer EXAM: NUCLEAR MEDICINE VENTILATION - PERFUSION LUNG SCAN VIEWS: Anterior, posterior, left lateral, right lateral, RPO, LPO, RAO, LAO -ventilation and profusion RADIOPHARMACEUTICALS:  32.7 mCi Technetium-20m DTPA aerosol inhalation and 3.8 mCi Technetium-98m MAA IV COMPARISON:  Chest radiograph September 15, 2016. FINDINGS: Ventilation: There is no segmental or significant subsegmental ventilation defect. A few rather minimal areas of inhomogeneity are noted on the ventilation study, likely due to what appears to be a degree of underlying COPD. Perfusion: There is no segmental or significant subsegmental perfusion defect. There are a few rather tiny matching ventilation and profusion defects, likely due to underlying COPD. No significant ventilation/  perfusion mismatch. IMPRESSION: Findings suggesting a degree of underlying COPD. No appreciable ventilation/ perfusion mismatch. No segmental or significant subsegmental perfusion defects. This study constitutes a low probability of pulmonary embolus. Electronically Signed   By: Bretta Bang III M.D.   On: 09/16/2016 11:49        Scheduled Meds: . aspirin  81 mg Oral Daily  . folic acid  1 mg Oral Daily  . furosemide  20 mg Oral QODAY  . heparin  5,000 Units Subcutaneous Q8H  . magnesium chloride  1 tablet Oral Daily  . potassium chloride  10 mEq Oral QODAY  . rosuvastatin  40 mg Oral q1800   Continuous Infusions:   LOS: 1 day     Jacquelin Hawking, MD [facility] 09/16/2016, 1:41 PM Pager: [phone]  If 7PM-7AM, please contact night-coverage [email or web address] Password [email or web address] 09/16/2016, 1:41 PM

## 2016-09-17 ENCOUNTER — Inpatient Hospital Stay (HOSPITAL_COMMUNITY): Payer: Medicare Other

## 2016-09-17 ENCOUNTER — Encounter (HOSPITAL_COMMUNITY): Payer: Self-pay | Admitting: Cardiology

## 2016-09-17 DIAGNOSIS — R609 Edema, unspecified: Secondary | ICD-10-CM

## 2016-09-17 DIAGNOSIS — J189 Pneumonia, unspecified organism: Secondary | ICD-10-CM

## 2016-09-17 DIAGNOSIS — E43 Unspecified severe protein-calorie malnutrition: Secondary | ICD-10-CM | POA: Insufficient documentation

## 2016-09-17 LAB — LIPID PANEL
CHOL/HDL RATIO: 3.4 ratio
CHOLESTEROL: 121 mg/dL (ref 0–200)
HDL: 36 mg/dL — AB (ref >40)
LDL Cholesterol: 69 mg/dL (ref 0–99)
TRIGLYCERIDES: 80 mg/dL (ref <150)
VLDL: 16 mg/dL (ref 0–40)

## 2016-09-17 LAB — TROPONIN I: Troponin I: 0.13 ng/mL (ref <0.03)

## 2016-09-17 MED ORDER — GI COCKTAIL ~~LOC~~
30.0000 mL | Freq: Every day | ORAL | Status: DC | PRN
  Administered 2016-09-18 – 2016-09-20 (×2): 30 mL via ORAL
  Filled 2016-09-17 (×3): qty 30

## 2016-09-17 MED ORDER — DEXTROSE 5 % IV SOLN
1.0000 g | INTRAVENOUS | Status: DC
  Administered 2016-09-17 – 2016-09-19 (×3): 1 g via INTRAVENOUS
  Filled 2016-09-17 (×3): qty 10

## 2016-09-17 MED ORDER — AMLODIPINE BESYLATE 5 MG PO TABS
2.5000 mg | ORAL_TABLET | Freq: Every day | ORAL | Status: DC
  Administered 2016-09-17: 2.5 mg via ORAL
  Filled 2016-09-17: qty 1

## 2016-09-17 MED ORDER — SODIUM CHLORIDE 0.9 % IV BOLUS (SEPSIS)
500.0000 mL | Freq: Once | INTRAVENOUS | Status: AC
  Administered 2016-09-17: 500 mL via INTRAVENOUS

## 2016-09-17 MED ORDER — DEXTROSE 5 % IV SOLN
500.0000 mg | INTRAVENOUS | Status: DC
  Administered 2016-09-17 – 2016-09-23 (×7): 500 mg via INTRAVENOUS
  Filled 2016-09-17 (×7): qty 500

## 2016-09-17 NOTE — Progress Notes (Addendum)
PROGRESS NOTE    Alicia [name]  ZOX:[id number] DOB: 27-Apr-1922 DOA: 09/15/2016 PCP: [name], MD   Brief Narrative: Alicia [name] is a 82 y.o. female with a history of Takotsubo gammopathy, hyperlipidemia, CKD, tobacco abuse, CAD. She presented with chest pain and elevated troponin. Etiology consulted to help with management of possible NSTEMI   Assessment & Plan:   Active Problems:   NSTEMI (non-ST elevated myocardial infarction) (HCC)   Acute combined systolic and diastolic heart failure (HCC)   Elevated d-dimer   Protein-calorie malnutrition, severe   NSTEMI Troponin elevated with the peak of 0.89 in setting of chest pain. Decreased T waves. No current chest pain. Trended down. Echocardiogram significant for apical inferior/anterior/septal mild hypokinesis with mildly dilated ventricle and elevated PA pressure of 52 mmHg; there is evidence of grade 1 diastolic dysfunction. -Continue aspirin -Cardiology recommendations: myoview on 09/18/16  Elevated d-dimer Low suspicion for PE. V/Q low risk. Venous dopplers negative for DVT.  CKD Baseline  Anemia Normocytic. Stable. Patient reports dark stools on admission. No hematochezia. Fecal occult blood test negative.  Hyperlipidemia Not on statin. LDL of 69 on 8/24  Decreased by mouth intake  -Dietitian consult  Diastolic heart dysfunction Seen on echocardiogram. EF of 55%. Grade 1 dysfunction.  Community acquired pneumonia Infiltrate seen on chest x-ray. Left-sided. Patient with cough and sputum production. Afebrile. No leukocytosis. It is possible this is atelectasis versus malignancy rather than pneumonia, but will treat initially. Follow-up chest x-ray in 3 weeks, and if still persistent, may decide to pursue further.   DVT prophylaxis: Heparin subq Code Status: Full code Family Communication: Cousin at bedside Disposition Plan: Discharge in 24-48 hours pending cardiac workup   Consultants:    Cardiology  Procedures:   Echocardiogram (8/23)  Antimicrobials:  None    Subjective: Patient reports some pain with deep inspiration in addition to when she coughs.  Objective: Vitals:   09/17/16 0553 09/17/16 1336 09/17/16 1342 09/17/16 1400  BP: (!) 143/77 (!) 84/38 (!) 86/37 (!) 86/36  Pulse: 95 81    Resp: 18 15    Temp: 98.5 F (36.9 C) 98 F (36.7 C)    TempSrc: Oral Oral    SpO2: 96% 93%    Weight:      Height:        Intake/Output Summary (Last 24 hours) at 09/17/16 1404 Last data filed at 09/17/16 1224  Gross per 24 hour  Intake              837 ml  Output              300 ml  Net              537 ml   Filed Weights   09/15/16 1634 09/15/16 2146  Weight: 54.9 kg (121 lb) 53.2 kg (117 lb 4.6 oz)    Examination:  General exam: Appears calm and comfortable  Respiratory system: Clear to auscultation. Respiratory effort normal. Cardiovascular system: S1 & S2 heard, RRR. No murmurs. Gastrointestinal system: Abdomen is nondistended, soft and nontender. Normal bowel sounds heard. Central nervous system: Alert and oriented. No focal neurological deficits. Extremities: No edema. No calf tenderness Skin: No cyanosis. No rashes Psychiatry: Judgement and insight appear normal. Mood & affect appropriate.     Data Reviewed: I have personally reviewed following labs and imaging studies  CBC:  Recent Labs Lab 09/15/16 1809 09/16/16 0129  WBC 7.5 7.0  NEUTROABS 6.0  --   HGB 10.6*  10.2*  HCT 32.1* 30.1*  MCV 87.7 87.0  PLT 254 245   Basic Metabolic Panel:  Recent Labs Lab 09/15/16 1809 09/16/16 0129  NA 133* 134*  K 4.7 4.1  CL 102 104  CO2 25 22  GLUCOSE 97 89  BUN 31* 29*  CREATININE 1.50* 1.34*  CALCIUM 9.2 8.9   GFR: Estimated Creatinine Clearance: 23 mL/min (A) (by C-G formula based on SCr of 1.34 mg/dL (H)). Liver Function Tests:  Recent Labs Lab 09/15/16 1809  AST 28  ALT 10*  ALKPHOS 60  BILITOT 0.9  PROT 6.9  ALBUMIN  3.2*   No results for input(s): LIPASE, AMYLASE in the last 168 hours. No results for input(s): AMMONIA in the last 168 hours. Coagulation Profile: No results for input(s): INR, PROTIME in the last 168 hours. Cardiac Enzymes:  Recent Labs Lab 09/15/16 1809 09/16/16 0129 09/16/16 1211  TROPONINI 0.89* 0.70* 0.38*   BNP (last 3 results) No results for input(s): PROBNP in the last 8760 hours. HbA1C: No results for input(s): HGBA1C in the last 72 hours. CBG: No results for input(s): GLUCAP in the last 168 hours. Lipid Profile:  Recent Labs  09/17/16 0503  CHOL 121  HDL 36*  LDLCALC 69  TRIG 80  CHOLHDL 3.4   Thyroid Function Tests: No results for input(s): TSH, T4TOTAL, FREET4, T3FREE, THYROIDAB in the last 72 hours. Anemia Panel: No results for input(s): VITAMINB12, FOLATE, FERRITIN, TIBC, IRON, RETICCTPCT in the last 72 hours. Sepsis Labs: No results for input(s): PROCALCITON, LATICACIDVEN in the last 168 hours.  No results found for this or any previous visit (from the past 240 hour(s)).       Radiology Studies: Dg Chest 2 View  Result Date: 09/15/2016 CLINICAL DATA:  Chest pain. EXAM: CHEST  2 VIEW COMPARISON:  Radiograph August 20, 2014. FINDINGS: Stable cardiomediastinal silhouette. Atherosclerosis of thoracic aorta is noted. No pneumothorax or pleural effusion is noted. Left midlung opacity is noted concerning for possible pneumonia or inflammation. Multilevel degenerative changes are noted in the thoracic spine. IMPRESSION: Focal left midlung opacity is noted concerning for pneumonia for atelectasis. Followup PA and lateral chest X-ray is recommended in 3-4 weeks following trial of antibiotic therapy to ensure resolution and exclude underlying malignancy. Electronically Signed   By: Lupita Raider, M.D.   On: 09/15/2016 19:30   Nm Pulmonary Perf And Vent  Result Date: 09/16/2016 CLINICAL DATA:  Chest pain with elevated D-dimer EXAM: NUCLEAR MEDICINE VENTILATION  - PERFUSION LUNG SCAN VIEWS: Anterior, posterior, left lateral, right lateral, RPO, LPO, RAO, LAO -ventilation and profusion RADIOPHARMACEUTICALS:  32.7 mCi Technetium-56m DTPA aerosol inhalation and 3.8 mCi Technetium-27m MAA IV COMPARISON:  Chest radiograph September 15, 2016. FINDINGS: Ventilation: There is no segmental or significant subsegmental ventilation defect. A few rather minimal areas of inhomogeneity are noted on the ventilation study, likely due to what appears to be a degree of underlying COPD. Perfusion: There is no segmental or significant subsegmental perfusion defect. There are a few rather tiny matching ventilation and profusion defects, likely due to underlying COPD. No significant ventilation/ perfusion mismatch. IMPRESSION: Findings suggesting a degree of underlying COPD. No appreciable ventilation/ perfusion mismatch. No segmental or significant subsegmental perfusion defects. This study constitutes a low probability of pulmonary embolus. Electronically Signed   By: Bretta Bang III M.D.   On: 09/16/2016 11:49        Scheduled Meds: . amLODipine  2.5 mg Oral Daily  . aspirin  81 mg Oral Daily  .  folic acid  1 mg Oral Daily  . furosemide  20 mg Oral QODAY  . heparin  5,000 Units Subcutaneous Q8H  . lactose free nutrition  237 mL Oral BID BM  . magnesium chloride  1 tablet Oral Daily  . multivitamin with minerals  1 tablet Oral Daily  . potassium chloride  10 mEq Oral QODAY  . rosuvastatin  40 mg Oral q1800   Continuous Infusions: . azithromycin Stopped (09/17/16 1259)  . cefTRIAXone (ROCEPHIN)  IV       LOS: 2 days     Jacquelin Hawking, MD [facility] 09/17/2016, 2:04 PM Pager: [phone]  If 7PM-7AM, please contact night-coverage [email or web address] Password [email or web address] 09/17/2016, 2:04 PM

## 2016-09-17 NOTE — Progress Notes (Signed)
Progress Note  Patient Name: Alicia [name] Date of Encounter: 09/17/2016  Primary Cardiologist: Dr Swaziland  Subjective   Some lt sided chest pain last night, none this am  Inpatient Medications    Scheduled Meds: . aspirin  81 mg Oral Daily  . folic acid  1 mg Oral Daily  . furosemide  20 mg Oral QODAY  . heparin  5,000 Units Subcutaneous Q8H  . lactose free nutrition  237 mL Oral BID BM  . magnesium chloride  1 tablet Oral Daily  . multivitamin with minerals  1 tablet Oral Daily  . potassium chloride  10 mEq Oral QODAY  . rosuvastatin  40 mg Oral q1800   Continuous Infusions:  PRN Meds: morphine injection, nitroGLYCERIN, ondansetron (ZOFRAN) IV, traMADol   Vital Signs    Vitals:   09/16/16 0406 09/16/16 1235 09/16/16 2053 09/17/16 0553  BP: (!) 104/49 (!) 124/53 (!) 129/53 (!) 143/77  Pulse: 69 73 89 95  Resp: 18 17 18 18   Temp: 98.4 F (36.9 C) 97.7 F (36.5 C) 98.1 F (36.7 C) 98.5 F (36.9 C)  TempSrc: Oral Oral Oral Oral  SpO2: 96% 93% 98% 96%  Weight:      Height:        Intake/Output Summary (Last 24 hours) at 09/17/16 0832 Last data filed at 09/16/16 1500  Gross per 24 hour  Intake              120 ml  Output                0 ml  Net              120 ml   Filed Weights   09/15/16 1634 09/15/16 2146  Weight: 121 lb (54.9 kg) 117 lb 4.6 oz (53.2 kg)    Telemetry    NSR - Personally Reviewed  ECG    NSR-new anterior lateral TWI - Personally Reviewed  Physical Exam   GEN: No acute distress.   Neck: No JVD Cardiac: RRR, no murmurs, rubs, or gallops.  Respiratory: Clear to auscultation bilaterally. GI: Soft, nontender, non-distended  MS: No edema; No deformity. Neuro:  Nonfocal  Psych: Normal affect   Labs    Chemistry Recent Labs Lab 09/15/16 1809 09/16/16 0129  NA 133* 134*  K 4.7 4.1  CL 102 104  CO2 25 22  GLUCOSE 97 89  BUN 31* 29*  CREATININE 1.50* 1.34*  CALCIUM 9.2 8.9  PROT 6.9  --   ALBUMIN 3.2*  --   AST  28  --   ALT 10*  --   ALKPHOS 60  --   BILITOT 0.9  --   GFRNONAA 29* 34*  GFRAA 34* 39*  ANIONGAP 6 8     Hematology Recent Labs Lab 09/15/16 1809 09/16/16 0129  WBC 7.5 7.0  RBC 3.66* 3.46*  HGB 10.6* 10.2*  HCT 32.1* 30.1*  MCV 87.7 87.0  MCH 29.0 29.5  MCHC 33.0 33.9  RDW 14.3 14.4  PLT 254 245    Cardiac Enzymes Recent Labs Lab 09/15/16 1809 09/16/16 0129 09/16/16 1211  TROPONINI 0.89* 0.70* 0.38*   No results for input(s): TROPIPOC in the last 168 hours.   BNPNo results for input(s): BNP, PROBNP in the last 168 hours.   DDimer  Recent Labs Lab 09/15/16 2019  DDIMER 7.13*     Radiology    Dg Chest 2 View  Result Date: 09/15/2016 CLINICAL DATA:  Chest pain. EXAM:  CHEST  2 VIEW COMPARISON:  Radiograph August 20, 2014. FINDINGS: Stable cardiomediastinal silhouette. Atherosclerosis of thoracic aorta is noted. No pneumothorax or pleural effusion is noted. Left midlung opacity is noted concerning for possible pneumonia or inflammation. Multilevel degenerative changes are noted in the thoracic spine. IMPRESSION: Focal left midlung opacity is noted concerning for pneumonia for atelectasis. Followup PA and lateral chest X-ray is recommended in 3-4 weeks following trial of antibiotic therapy to ensure resolution and exclude underlying malignancy. Electronically Signed   By: Lupita Raider, M.D.   On: 09/15/2016 19:30   Nm Pulmonary Perf And Vent  Result Date: 09/16/2016 CLINICAL DATA:  Chest pain with elevated D-dimer EXAM: NUCLEAR MEDICINE VENTILATION - PERFUSION LUNG SCAN VIEWS: Anterior, posterior, left lateral, right lateral, RPO, LPO, RAO, LAO -ventilation and profusion RADIOPHARMACEUTICALS:  32.7 mCi Technetium-81m DTPA aerosol inhalation and 3.8 mCi Technetium-3m MAA IV COMPARISON:  Chest radiograph September 15, 2016. FINDINGS: Ventilation: There is no segmental or significant subsegmental ventilation defect. A few rather minimal areas of inhomogeneity are noted on  the ventilation study, likely due to what appears to be a degree of underlying COPD. Perfusion: There is no segmental or significant subsegmental perfusion defect. There are a few rather tiny matching ventilation and profusion defects, likely due to underlying COPD. No significant ventilation/ perfusion mismatch. IMPRESSION: Findings suggesting a degree of underlying COPD. No appreciable ventilation/ perfusion mismatch. No segmental or significant subsegmental perfusion defects. This study constitutes a low probability of pulmonary embolus. Electronically Signed   By: Bretta Bang III M.D.   On: 09/16/2016 11:49    Cardiac Studies   Echo 09/16/16- Impressions:  - Normal LV size with mild LV hypertrophy. EF 55% with apical   inferior/anterior/septal mild hypokinesis. Mildly dilated RV with   normal systolic function. Moderate left atrial enlargement. Mild   to moderate TR. Moderate pulmonary hypertension.   Patient Profile     83 y.o. female with a history of a Takotsubo event in July 2016-cath then showed minor CAD. Her echo in Sept 2016 was normal. She was admitted 09/15/16 with chest pain, new anterior TWI, and elevated Troponin in the setting of emotional distress. Echo shows new apical WMA c/w Sept 2016 echo.   Assessment & Plan    Takotsubo MI- Overall EF preserved. Troponin peak was 0.89.   CAD- Mild CAD at cath July 2016  CRI-3 GFR 34  HLD- LDL 69 on high dose statin Rx  Plan: Myoview today to r/o high risk ischemia. Add low dose Amlodipine for coronary spasm.   Jolene Provost, PA-C  09/17/2016, 8:32 AM

## 2016-09-17 NOTE — Progress Notes (Signed)
VASCULAR LAB PRELIMINARY  PRELIMINARY  PRELIMINARY  PRELIMINARY  Bilateral lower extremity venous duplex completed.    Preliminary report:  Bilateral:  No evidence of DVT or superficial thrombosis. No evidence of a Baker's cyst on the right. There is an anechoic area in the left popliteal fossa coursing  6.61 cm into the calf consistent with a ruptured Baker's cyst.   Charnell Peplinski, RVS 09/17/2016, 10:40 AM

## 2016-09-17 NOTE — Progress Notes (Signed)
Patient c/o 9 out of 10 pressure/aching in chest.  MD made aware.  EKG completed.  One dose of nitro given.  Vital signs stable. Stat troponin ordered.  Patient stated pain has decreased, resting comfortably in bed.  Will continue to monitor closely and carry out any new orders.

## 2016-09-18 ENCOUNTER — Ambulatory Visit (HOSPITAL_COMMUNITY)
Admit: 2016-09-18 | Discharge: 2016-09-18 | Disposition: A | Discharge status: Home / Self Care | Payer: Medicare Other | Attending: Cardiovascular Disease | Admitting: Cardiovascular Disease

## 2016-09-18 DIAGNOSIS — R079 Chest pain, unspecified: Secondary | ICD-10-CM

## 2016-09-18 DIAGNOSIS — I519 Heart disease, unspecified: Secondary | ICD-10-CM | POA: Insufficient documentation

## 2016-09-18 DIAGNOSIS — R0789 Other chest pain: Secondary | ICD-10-CM

## 2016-09-18 LAB — NM MYOCAR MULTI W/SPECT W/WALL MOTION / EF
CHL CUP RESTING HR STRESS: 83 {beats}/min
CSEPED: 6 min
CSEPEDS: 11 s
CSEPHR: 72 %
Estimated workload: 1 METS
MPHR: 129 {beats}/min
Peak HR: 94 {beats}/min

## 2016-09-18 MED ORDER — REGADENOSON 0.4 MG/5ML IV SOLN
0.4000 mg | Freq: Once | INTRAVENOUS | Status: AC
  Administered 2016-09-18: 0.4 mg via INTRAVENOUS

## 2016-09-18 MED ORDER — TECHNETIUM TC 99M TETROFOSMIN IV KIT
10.0000 | PACK | Freq: Once | INTRAVENOUS | Status: AC | PRN
  Administered 2016-09-18: 10 via INTRAVENOUS

## 2016-09-18 MED ORDER — TECHNETIUM TC 99M TETROFOSMIN IV KIT
30.0000 | PACK | Freq: Once | INTRAVENOUS | Status: AC | PRN
  Administered 2016-09-18: 30 via INTRAVENOUS

## 2016-09-18 MED ORDER — REGADENOSON 0.4 MG/5ML IV SOLN
INTRAVENOUS | Status: AC
  Filled 2016-09-18: qty 5

## 2016-09-18 NOTE — Progress Notes (Signed)
PROGRESS NOTE    Alicia [name]  ZOX:[id number] DOB: 11-Sep-1922 DOA: 09/15/2016 PCP: [name], MD   Brief Narrative: Alicia [name] is a 83 y.o. female with a history of Takotsubo gammopathy, hyperlipidemia, CKD, tobacco abuse, CAD. She presented with chest pain and elevated troponin. Etiology consulted to help with management of possible NSTEMI   Assessment & Plan:   Active Problems:   NSTEMI (non-ST elevated myocardial infarction) (HCC)   Acute combined systolic and diastolic heart failure (HCC)   Elevated d-dimer   Protein-calorie malnutrition, severe   NSTEMI Troponin elevated with the peak of 0.89 in setting of chest pain. Decreased T waves. No current chest pain. Trended down. Echocardiogram significant for apical inferior/anterior/septal mild hypokinesis with mildly dilated ventricle and elevated PA pressure of 52 mmHg; there is evidence of grade 1 diastolic dysfunction. -Continue aspirin -Cardiology recommendations: Myoview today -morphine prn  Chest pain Episode last night. Troponin still trended down. Repeat EKG largely unchanged. Improved after nitro. -morphine prn -GI cocktail daily prn -nitro prn  Elevated d-dimer Low suspicion for PE. V/Q low risk. Venous dopplers negative for DVT.  CKD Baseline  Anemia Normocytic. Stable. Patient reports dark stools on admission. No hematochezia. Fecal occult blood test negative.  Hyperlipidemia Not on statin. LDL of 69 on 8/24  Decreased by mouth intake  -Dietitian consult  Diastolic heart dysfunction Seen on echocardiogram. EF of 55%. Grade 1 dysfunction.  Community acquired pneumonia Infiltrate seen on chest x-ray. Left-sided. Patient with cough and sputum production. Afebrile. No leukocytosis. It is possible this is atelectasis versus malignancy rather than pneumonia, but will treat initially. Follow-up chest x-ray in 3 weeks, and if still persistent, may decide to pursue further.  Severe  malnutrition -Dietary recommendations: supplementation and mutlivitamin   DVT prophylaxis: Heparin subq Code Status: DNR (Clarified with patient and POA) Family Communication: Cousin at bedside Disposition Plan: Discharge in 24 hours pending cardiac workup   Consultants:   Cardiology  Procedures:   Echocardiogram (8/23)  Antimicrobials:  None    Subjective: No pain overnight. Significant pain yesterday evening that was substernal and pressure-like. Improved with nitro.   Objective: Vitals:   09/17/16 1832 09/17/16 2027 09/18/16 0016 09/18/16 0447  BP:  131/73  (!) 107/47  Pulse:  90  76  Resp:  16 17 15   Temp:  99.6 F (37.6 C)  98.5 F (36.9 C)  TempSrc:  Oral  Oral  SpO2: 94% 96%  97%  Weight:      Height:        Intake/Output Summary (Last 24 hours) at 09/18/16 0951 Last data filed at 09/17/16 2200  Gross per 24 hour  Intake             1757 ml  Output                1 ml  Net             1756 ml   Filed Weights   09/15/16 1634 09/15/16 2146  Weight: 54.9 kg (121 lb) 53.2 kg (117 lb 4.6 oz)    Examination:  General exam: Appears calm and comfortable  Respiratory system: Clear to auscultation bilaterally. Unlabored work of breathing. No wheezing or rales. Cardiovascular system: Regular rate and rhythm. Normal S1 and S2. No heart murmurs present. No extra heart sounds Gastrointestinal system: Abdomen is nondistended, soft and nontender. Normal bowel sounds heard. Central nervous system: Alert and oriented. No focal neurological deficits. Extremities: No edema. No calf tenderness  Skin: No cyanosis. No rashes Psychiatry: Judgement and insight appear normal. Mood & affect appropriate.     Data Reviewed: I have personally reviewed following labs and imaging studies  CBC:  Recent Labs Lab 09/15/16 1809 09/16/16 0129  WBC 7.5 7.0  NEUTROABS 6.0  --   HGB 10.6* 10.2*  HCT 32.1* 30.1*  MCV 87.7 87.0  PLT 254 245   Basic Metabolic  Panel:  Recent Labs Lab 09/15/16 1809 09/16/16 0129  NA 133* 134*  K 4.7 4.1  CL 102 104  CO2 25 22  GLUCOSE 97 89  BUN 31* 29*  CREATININE 1.50* 1.34*  CALCIUM 9.2 8.9   GFR: Estimated Creatinine Clearance: 23 mL/min (A) (by C-G formula based on SCr of 1.34 mg/dL (H)). Liver Function Tests:  Recent Labs Lab 09/15/16 1809  AST 28  ALT 10*  ALKPHOS 60  BILITOT 0.9  PROT 6.9  ALBUMIN 3.2*   No results for input(s): LIPASE, AMYLASE in the last 168 hours. No results for input(s): AMMONIA in the last 168 hours. Coagulation Profile: No results for input(s): INR, PROTIME in the last 168 hours. Cardiac Enzymes:  Recent Labs Lab 09/15/16 1809 09/16/16 0129 09/16/16 1211 09/17/16 1825  TROPONINI 0.89* 0.70* 0.38* 0.13*   BNP (last 3 results) No results for input(s): PROBNP in the last 8760 hours. HbA1C: No results for input(s): HGBA1C in the last 72 hours. CBG: No results for input(s): GLUCAP in the last 168 hours. Lipid Profile:  Recent Labs  09/17/16 0503  CHOL 121  HDL 36*  LDLCALC 69  TRIG 80  CHOLHDL 3.4   Thyroid Function Tests: No results for input(s): TSH, T4TOTAL, FREET4, T3FREE, THYROIDAB in the last 72 hours. Anemia Panel: No results for input(s): VITAMINB12, FOLATE, FERRITIN, TIBC, IRON, RETICCTPCT in the last 72 hours. Sepsis Labs: No results for input(s): PROCALCITON, LATICACIDVEN in the last 168 hours.  No results found for this or any previous visit (from the past 240 hour(s)).       Radiology Studies: Nm Pulmonary Perf And Vent  Result Date: 09/16/2016 CLINICAL DATA:  Chest pain with elevated D-dimer EXAM: NUCLEAR MEDICINE VENTILATION - PERFUSION LUNG SCAN VIEWS: Anterior, posterior, left lateral, right lateral, RPO, LPO, RAO, LAO -ventilation and profusion RADIOPHARMACEUTICALS:  32.7 mCi Technetium-89m DTPA aerosol inhalation and 3.8 mCi Technetium-47m MAA IV COMPARISON:  Chest radiograph September 15, 2016. FINDINGS: Ventilation: There  is no segmental or significant subsegmental ventilation defect. A few rather minimal areas of inhomogeneity are noted on the ventilation study, likely due to what appears to be a degree of underlying COPD. Perfusion: There is no segmental or significant subsegmental perfusion defect. There are a few rather tiny matching ventilation and profusion defects, likely due to underlying COPD. No significant ventilation/ perfusion mismatch. IMPRESSION: Findings suggesting a degree of underlying COPD. No appreciable ventilation/ perfusion mismatch. No segmental or significant subsegmental perfusion defects. This study constitutes a low probability of pulmonary embolus. Electronically Signed   By: Bretta Bang III M.D.   On: 09/16/2016 11:49        Scheduled Meds: . aspirin  81 mg Oral Daily  . folic acid  1 mg Oral Daily  . furosemide  20 mg Oral QODAY  . heparin  5,000 Units Subcutaneous Q8H  . lactose free nutrition  237 mL Oral BID BM  . magnesium chloride  1 tablet Oral Daily  . multivitamin with minerals  1 tablet Oral Daily  . potassium chloride  10 mEq Oral QODAY  .  rosuvastatin  40 mg Oral q1800   Continuous Infusions: . azithromycin Stopped (09/17/16 1259)  . cefTRIAXone (ROCEPHIN)  IV Stopped (09/17/16 1536)     LOS: 3 days     Jacquelin Hawking, MD [facility] 09/18/2016, 9:51 AM Pager: [phone]  If 7PM-7AM, please contact night-coverage [email or web address] Password [facility] 09/18/2016, 9:51 AM

## 2016-09-18 NOTE — Progress Notes (Signed)
Progress Note  Patient Name: Alicia [name] Date of Encounter: 09/18/2016  Primary Cardiologist: Dr. [place]  Subjective   She is having some right shoulder echo.  No chest or left arm pain.   Inpatient Medications    Scheduled Meds: . aspirin  81 mg Oral Daily  . folic acid  1 mg Oral Daily  . furosemide  20 mg Oral QODAY  . heparin  5,000 Units Subcutaneous Q8H  . lactose free nutrition  237 mL Oral BID BM  . magnesium chloride  1 tablet Oral Daily  . multivitamin with minerals  1 tablet Oral Daily  . potassium chloride  10 mEq Oral QODAY  . rosuvastatin  40 mg Oral q1800   Continuous Infusions: . azithromycin Stopped (09/17/16 1259)  . cefTRIAXone (ROCEPHIN)  IV Stopped (09/17/16 1536)   PRN Meds: gi cocktail, morphine injection, nitroGLYCERIN, ondansetron (ZOFRAN) IV, traMADol   Vital Signs    Vitals:   09/17/16 1832 09/17/16 2027 09/18/16 0016 09/18/16 0447  BP:  131/73  (!) 107/47  Pulse:  90  76  Resp:  16 17 15   Temp:  99.6 F (37.6 C)  98.5 F (36.9 C)  TempSrc:  Oral  Oral  SpO2: 94% 96%  97%  Weight:      Height:        Intake/Output Summary (Last 24 hours) at 09/18/16 0840 Last data filed at 09/17/16 2200  Gross per 24 hour  Intake             1757 ml  Output                1 ml  Net             1756 ml   Filed Weights   09/15/16 1634 09/15/16 2146  Weight: 121 lb (54.9 kg) 117 lb 4.6 oz (53.2 kg)    Telemetry    NSR - Personally Reviewed  ECG    NA - Personally Reviewed  Physical Exam   GEN: No acute distress.  Frail Neck: No  JVD Cardiac: RRR, no murmurs, rubs, or gallops.  Respiratory: Clear  to auscultation bilaterally. GI: Soft, nontender, non-distended  MS: No  edema; No deformity. Neuro:  Nonfocal  Psych: Normal affect   Labs    Chemistry Recent Labs Lab 09/15/16 1809 09/16/16 0129  NA 133* 134*  K 4.7 4.1  CL 102 104  CO2 25 22  GLUCOSE 97 89  BUN 31* 29*  CREATININE 1.50* 1.34*  CALCIUM 9.2 8.9  PROT  6.9  --   ALBUMIN 3.2*  --   AST 28  --   ALT 10*  --   ALKPHOS 60  --   BILITOT 0.9  --   GFRNONAA 29* 34*  GFRAA 34* 39*  ANIONGAP 6 8     Hematology Recent Labs Lab 09/15/16 1809 09/16/16 0129  WBC 7.5 7.0  RBC 3.66* 3.46*  HGB 10.6* 10.2*  HCT 32.1* 30.1*  MCV 87.7 87.0  MCH 29.0 29.5  MCHC 33.0 33.9  RDW 14.3 14.4  PLT 254 245    Cardiac Enzymes Recent Labs Lab 09/15/16 1809 09/16/16 0129 09/16/16 1211 09/17/16 1825  TROPONINI 0.89* 0.70* 0.38* 0.13*   No results for input(s): TROPIPOC in the last 168 hours.   BNPNo results for input(s): BNP, PROBNP in the last 168 hours.   DDimer  Recent Labs Lab 09/15/16 2019  DDIMER 7.13*     Radiology  Nm Pulmonary Perf And Vent  Result Date: 09/16/2016 CLINICAL DATA:  Chest pain with elevated D-dimer EXAM: NUCLEAR MEDICINE VENTILATION - PERFUSION LUNG SCAN VIEWS: Anterior, posterior, left lateral, right lateral, RPO, LPO, RAO, LAO -ventilation and profusion RADIOPHARMACEUTICALS:  32.7 mCi Technetium-68m DTPA aerosol inhalation and 3.8 mCi Technetium-55m MAA IV COMPARISON:  Chest radiograph September 15, 2016. FINDINGS: Ventilation: There is no segmental or significant subsegmental ventilation defect. A few rather minimal areas of inhomogeneity are noted on the ventilation study, likely due to what appears to be a degree of underlying COPD. Perfusion: There is no segmental or significant subsegmental perfusion defect. There are a few rather tiny matching ventilation and profusion defects, likely due to underlying COPD. No significant ventilation/ perfusion mismatch. IMPRESSION: Findings suggesting a degree of underlying COPD. No appreciable ventilation/ perfusion mismatch. No segmental or significant subsegmental perfusion defects. This study constitutes a low probability of pulmonary embolus. Electronically Signed   By: Bretta Bang III M.D.   On: 09/16/2016 11:49    Cardiac Studies   ECHO  8/23  Study  Conclusions  - Left ventricle: The cavity size was normal. Wall thickness was   increased in a pattern of mild LVH. The estimated ejection   fraction was 55%. There appears to be apical inferior, apical   anterior, and apical septal mild hypokinesis. Doppler parameters   are consistent with abnormal left ventricular relaxation (grade 1   diastolic dysfunction). - Aortic valve: There was no stenosis. - Mitral valve: There was no significant regurgitation. - Left atrium: The atrium was moderately dilated. - Right ventricle: The cavity size was mildly dilated. Systolic   function was normal. - Tricuspid valve: There was mild-moderate regurgitation. Peak   RV-RA gradient (S): 49 mm Hg. - Pulmonary arteries: PA peak pressure: 52 mm Hg (S). - Inferior vena cava: The vessel was normal in size. The   respirophasic diameter changes were in the normal range (= 50%),   consistent with normal central venous pressure. - Pericardium, extracardiac: A trivial pericardial effusion was   identified.  Patient Profile     83 y.o. female witha history of a Takotsubo event in July 2016-cath then showed minor CAD. Her echo in Sept 2016 was normal. She was admitted 09/15/16 with chest pain, new anterior TWI, and elevated Troponin in the setting of emotional distress. Echo shows new apical WMA c/w Sept 2016 echo.    Assessment & Plan    TAKOTSUBO MI:  Echo as above.  Troponin is down.  She is to have [employer]. Today.  Pain in the right shoulder is very atypical.    CRI STAGE III:    Creat was down yesterday.  No change in therapy.     Signed, Rollene Rotunda, MD  09/18/2016, 8:40 AM

## 2016-09-18 NOTE — Progress Notes (Signed)
   Alicia [name] presented for a nuclear stress test today.  No immediate complications.  Stress imaging is pending at this time.  Preliminary EKG findings may be listed in the chart, but the stress test result will not be finalized until perfusion imaging is complete.  1 day study, [facility] to read.  Leanna Battles 09/18/2016, 10:56 AM

## 2016-09-19 ENCOUNTER — Inpatient Hospital Stay (HOSPITAL_COMMUNITY): Payer: Medicare Other

## 2016-09-19 DIAGNOSIS — J189 Pneumonia, unspecified organism: Secondary | ICD-10-CM

## 2016-09-19 LAB — EXPECTORATED SPUTUM ASSESSMENT W REFEX TO RESP CULTURE: SPECIAL REQUESTS: NORMAL

## 2016-09-19 LAB — BASIC METABOLIC PANEL
ANION GAP: 7 (ref 5–15)
BUN: 28 mg/dL — ABNORMAL HIGH (ref 6–20)
CALCIUM: 8.4 mg/dL — AB (ref 8.9–10.3)
CO2: 22 mmol/L (ref 22–32)
Chloride: 96 mmol/L — ABNORMAL LOW (ref 101–111)
Creatinine, Ser: 2.01 mg/dL — ABNORMAL HIGH (ref 0.44–1.00)
GFR calc Af Amer: 24 mL/min — ABNORMAL LOW (ref >60)
GFR, EST NON AFRICAN AMERICAN: 21 mL/min — AB (ref >60)
GLUCOSE: 162 mg/dL — AB (ref 65–99)
Potassium: 4 mmol/L (ref 3.5–5.1)
SODIUM: 125 mmol/L — AB (ref 135–145)

## 2016-09-19 LAB — MRSA PCR SCREENING: MRSA by PCR: NEGATIVE

## 2016-09-19 LAB — EXPECTORATED SPUTUM ASSESSMENT W GRAM STAIN, RFLX TO RESP C

## 2016-09-19 MED ORDER — DEXTROSE 5 % IV SOLN
500.0000 mg | INTRAVENOUS | Status: DC
  Administered 2016-09-19 – 2016-09-23 (×5): 500 mg via INTRAVENOUS
  Filled 2016-09-19 (×7): qty 0.5

## 2016-09-19 MED ORDER — GUAIFENESIN-DM 100-10 MG/5ML PO SYRP
15.0000 mL | ORAL_SOLUTION | ORAL | Status: DC | PRN
  Administered 2016-09-19 – 2016-09-22 (×3): 15 mL via ORAL
  Filled 2016-09-19 (×3): qty 20

## 2016-09-19 NOTE — Progress Notes (Signed)
Progress Note  Patient Name: Alicia [name] Date of Encounter: 09/19/2016  Primary Cardiologist: Dr. [place]  Subjective   No chest pain.  No SOB.  She does not have much of an appetite and has some lower abd pain.   Inpatient Medications    Scheduled Meds: . aspirin  81 mg Oral Daily  . folic acid  1 mg Oral Daily  . furosemide  20 mg Oral QODAY  . heparin  5,000 Units Subcutaneous Q8H  . lactose free nutrition  237 mL Oral BID BM  . magnesium chloride  1 tablet Oral Daily  . multivitamin with minerals  1 tablet Oral Daily  . potassium chloride  10 mEq Oral QODAY  . rosuvastatin  40 mg Oral q1800   Continuous Infusions: . azithromycin Stopped (09/18/16 1315)  . cefTRIAXone (ROCEPHIN)  IV Stopped (09/18/16 1430)   PRN Meds: gi cocktail, morphine injection, nitroGLYCERIN, ondansetron (ZOFRAN) IV, traMADol   Vital Signs    Vitals:   09/18/16 1738 09/18/16 1924 09/18/16 2152 09/19/16 0100  BP: (!) 122/55 (!) 102/45 (!) 96/40 (!) 102/58  Pulse: 95  98 94  Resp:   19   Temp: (!) 100.8 F (38.2 C)  98 F (36.7 C)   TempSrc: Oral  Oral   SpO2: 95%  98%   Weight:      Height:        Intake/Output Summary (Last 24 hours) at 09/19/16 0905 Last data filed at 09/19/16 0531  Gross per 24 hour  Intake             1760 ml  Output              250 ml  Net             1510 ml   Filed Weights   09/15/16 1634 09/15/16 2146  Weight: 121 lb (54.9 kg) 117 lb 4.6 oz (53.2 kg)    Telemetry    NSR - Personally Reviewed  ECG    NA - Personally Reviewed  Physical Exam   GEN: No  acute distress.   Neck: No  JVD Cardiac: RRR, no murmurs, rubs, or gallops.  Respiratory: Clear   to auscultation bilaterally. GI: Soft, nontender, non-distended, normal bowel sounds  MS:  No edema; No deformity. Neuro:   Nonfocal  Psych: Oriented and appropriate    Labs    Chemistry  Recent Labs Lab 09/15/16 1809 09/16/16 0129  NA 133* 134*  K 4.7 4.1  CL 102 104  CO2 25 22    GLUCOSE 97 89  BUN 31* 29*  CREATININE 1.50* 1.34*  CALCIUM 9.2 8.9  PROT 6.9  --   ALBUMIN 3.2*  --   AST 28  --   ALT 10*  --   ALKPHOS 60  --   BILITOT 0.9  --   GFRNONAA 29* 34*  GFRAA 34* 39*  ANIONGAP 6 8     Hematology  Recent Labs Lab 09/15/16 1809 09/16/16 0129  WBC 7.5 7.0  RBC 3.66* 3.46*  HGB 10.6* 10.2*  HCT 32.1* 30.1*  MCV 87.7 87.0  MCH 29.0 29.5  MCHC 33.0 33.9  RDW 14.3 14.4  PLT 254 245    Cardiac Enzymes  Recent Labs Lab 09/15/16 1809 09/16/16 0129 09/16/16 1211 09/17/16 1825  TROPONINI 0.89* 0.70* 0.38* 0.13*   No results for input(s): TROPIPOC in the last 168 hours.   BNPNo results for input(s): BNP, PROBNP in the last 168  hours.   DDimer   Recent Labs Lab 09/15/16 2019  DDIMER 7.13*     Radiology    Nm Myocar Multi W/spect W/wall Motion / Ef  Result Date: 09/18/2016 CLINICAL DATA:  Chest pain. EXAM: MYOCARDIAL IMAGING WITH SPECT (REST AND PHARMACOLOGIC-STRESS) GATED LEFT VENTRICULAR WALL MOTION STUDY LEFT VENTRICULAR EJECTION FRACTION TECHNIQUE: Standard myocardial SPECT imaging was performed after resting intravenous injection of 10 mCi Tc-24m tetrofosmin. Subsequently, intravenous infusion of Lexiscan was performed under the supervision of the Cardiology staff. At peak effect of the drug, 30 mCi Tc-11m tetrofosmin was injected intravenously and standard myocardial SPECT imaging was performed. Quantitative gated imaging was also performed to evaluate left ventricular wall motion, and estimate left ventricular ejection fraction. COMPARISON:  None. FINDINGS: Perfusion: There is a moderate size defect in the apex seen at rest and stress. While there is a fixed component, there is a small to moderate region of reversibility within this defect. Wall Motion: Poor thickening in the region of the myocardial defect. Other walls move normally. Left Ventricular Ejection Fraction: 56 % End diastolic volume 101 ml End systolic volume 44 ml  IMPRESSION: 1. There is a moderate sized defect centered in the apex extending into the low anterior wall. While there is a fixed component consistent with infarct, there is a small to moderate region demonstrating reversibility as well consistent with associated ischemia. . 2. Poor thickening in the region of the myocardial defect centered at the apex. 3. Left ventricular ejection fraction 56% 4. Non invasive risk stratification*: Intermediate *2012 Appropriate Use Criteria for Coronary Revascularization Focused Update: J Am Coll Cardiol. 2012;59(9):857-881. http://content.[email or web address]?articleid=[email or web address] Electronically Signed   By: Gerome Sam III M.D   On: 09/18/2016 12:56    Cardiac Studies   ECHO  8/23  Study Conclusions  - Left ventricle: The cavity size was normal. Wall thickness was   increased in a pattern of mild LVH. The estimated ejection   fraction was 55%. There appears to be apical inferior, apical   anterior, and apical septal mild hypokinesis. Doppler parameters   are consistent with abnormal left ventricular relaxation (grade 1   diastolic dysfunction). - Aortic valve: There was no stenosis. - Mitral valve: There was no significant regurgitation. - Left atrium: The atrium was moderately dilated. - Right ventricle: The cavity size was mildly dilated. Systolic   function was normal. - Tricuspid valve: There was mild-moderate regurgitation. Peak   RV-RA gradient (S): 49 mm Hg. - Pulmonary arteries: PA peak pressure: 52 mm Hg (S). - Inferior vena cava: The vessel was normal in size. The   respirophasic diameter changes were in the normal range (= 50%),   consistent with normal central venous pressure. - Pericardium, extracardiac: A trivial pericardial effusion was   identified.  Lexiscan Myoview  8/25 1. There is a moderate sized defect centered in the apex extending into the low anterior wall. While there is a fixed component consistent with infarct,  there is a small to moderate region demonstrating reversibility as well consistent with associated ischemia. 2. Poor thickening in the region of the myocardial defect centered at the apex. 3. Left ventricular ejection fraction 56%   Patient Profile     83 y.o. female witha history of a Takotsubo event in July 2016-cath then showed minor CAD. Her echo in Sept 2016 was normal. She was admitted 09/15/16 with chest pain, new anterior TWI, and elevated Troponin in the setting of emotional distress. Echo shows new apical WMA c/w Sept 2016 echo.  Assessment & Plan    TAKOTSUBO MI:  Echo as above.  Lexiscan Myoview as above.  this is not high risk.  The predominance of the defect is fixed.  This would be consistent with the pervious event.  She had only mild non obstructive disease on cath in 2016.  She would be higher risk for any invasive procedure and her symptoms are atypical.  I would suggest continued medical management.      CRI STAGE III:    Creat is stable.     Signed, Rollene Rotunda, MD  09/19/2016, 9:05 AM

## 2016-09-19 NOTE — Progress Notes (Signed)
PROGRESS NOTE    TERILYNN BURESH  ZOX:[id number] DOB: [birth year] DOA: 09/15/2016 PCP: [name], MD   Brief Narrative: GENEAL HUEBERT is a 83 y.o. female with a history of Takotsubo gammopathy, hyperlipidemia, CKD, tobacco abuse, CAD. She presented with chest pain and elevated troponin. Etiology consulted to help with management of possible NSTEMI   Assessment & Plan:   Active Problems:   NSTEMI (non-ST elevated myocardial infarction) (HCC)   Acute combined systolic and diastolic heart failure (HCC)   Elevated d-dimer   Protein-calorie malnutrition, severe   NSTEMI Troponin elevated with the peak of 0.89 in setting of chest pain. Decreased T waves. No current chest pain. Trended down. Echocardiogram significant for apical inferior/anterior/septal mild hypokinesis with mildly dilated ventricle and elevated PA pressure of 52 mmHg; there is evidence of grade 1 diastolic dysfunction. Myoview significant for a defect with a fixed component. Likely Takotsubo MI per cardiology assessment. Recommending medical management. -Continue aspirin -Cardiology recommendations -morphine prn -nitro prn -PT eval pending  Chest pain Patient had another episode yesterday. Possibly related to already known MI vs lung pathology. Improved with GI cocktail and nitro. Possible GI component as well. -morphine prn -GI cocktail daily prn -nitro prn -CT chest  Elevated d-dimer Low suspicion for PE. V/Q low risk. Venous dopplers negative for DVT.  CKD Baseline. -repeat BMP today  Anemia Normocytic. Stable. Patient reports dark stools on admission. No hematochezia. Fecal occult blood test negative.  Hyperlipidemia Not on statin. LDL of 69 on 8/24  Decreased by mouth intake  -Dietitian consult  Diastolic heart dysfunction Seen on echocardiogram. EF of 55%. Grade 1 dysfunction.  Community acquired pneumonia Infiltrate seen on chest x-ray. Left-sided. Patient with cough and sputum  production. Afebrile. No leukocytosis. It is possible this is atelectasis versus malignancy rather than pneumonia, but will treat initially. Follow-up chest x-ray in 3 weeks, and if still persistent, may decide to pursue further. -continue Ceftriaxone/azithromycin  Severe malnutrition -Dietary recommendations: supplementation and mutlivitamin   DVT prophylaxis: Heparin subq Code Status: DNR (Clarified with patient and POA) Family Communication: None at bedside Disposition Plan: Anticipate discharge to SNF. Social worker consulted. PT eval pending   Consultants:   Cardiology  Procedures:   Echocardiogram (8/23)  Myoview (8/25)  Antimicrobials:  None    Subjective: Another episode of substernal pressure pain yesterday that she states also hurt when pressing on her chest. Improved with GI cocktail and nitro. No pain this morning. She has a cough that has improved. No reflux.  Objective: Vitals:   09/18/16 1738 09/18/16 1924 09/18/16 2152 09/19/16 0100  BP: (!) 122/55 (!) 102/45 (!) 96/40 (!) 102/58  Pulse: 95  98 94  Resp:   19   Temp: (!) 100.8 F (38.2 C)  98 F (36.7 C)   TempSrc: Oral  Oral   SpO2: 95%  98%   Weight:      Height:        Intake/Output Summary (Last 24 hours) at 09/19/16 1033 Last data filed at 09/19/16 0531  Gross per 24 hour  Intake             1760 ml  Output              250 ml  Net             1510 ml   Filed Weights   09/15/16 1634 09/15/16 2146  Weight: 54.9 kg (121 lb) 53.2 kg (117 lb 4.6 oz)    Examination:  General exam: Appears calm and comfortable  Respiratory system: Clear to auscultation bilaterally. Unlabored work of breathing. No wheezing or rales. Cardiovascular system: Regular rate and rhythm. Normal S1 and S2. No heart murmurs present. No extra heart sounds Gastrointestinal system: Abdomen is nondistended, soft and nontender. Normal bowel sounds heard. Central nervous system: Alert and oriented. No focal neurological  deficits. Extremities: No edema. No calf tenderness Skin: No cyanosis. No rashes Psychiatry: Judgement and insight appear normal. Mood & affect appropriate.     Data Reviewed: I have personally reviewed following labs and imaging studies  CBC:  Recent Labs Lab 09/15/16 1809 09/16/16 0129  WBC 7.5 7.0  NEUTROABS 6.0  --   HGB 10.6* 10.2*  HCT 32.1* 30.1*  MCV 87.7 87.0  PLT 254 245   Basic Metabolic Panel:  Recent Labs Lab 09/15/16 1809 09/16/16 0129  NA 133* 134*  K 4.7 4.1  CL 102 104  CO2 25 22  GLUCOSE 97 89  BUN 31* 29*  CREATININE 1.50* 1.34*  CALCIUM 9.2 8.9   GFR: Estimated Creatinine Clearance: 23 mL/min (A) (by C-G formula based on SCr of 1.34 mg/dL (H)). Liver Function Tests:  Recent Labs Lab 09/15/16 1809  AST 28  ALT 10*  ALKPHOS 60  BILITOT 0.9  PROT 6.9  ALBUMIN 3.2*   No results for input(s): LIPASE, AMYLASE in the last 168 hours. No results for input(s): AMMONIA in the last 168 hours. Coagulation Profile: No results for input(s): INR, PROTIME in the last 168 hours. Cardiac Enzymes:  Recent Labs Lab 09/15/16 1809 09/16/16 0129 09/16/16 1211 09/17/16 1825  TROPONINI 0.89* 0.70* 0.38* 0.13*   BNP (last 3 results) No results for input(s): PROBNP in the last 8760 hours. HbA1C: No results for input(s): HGBA1C in the last 72 hours. CBG: No results for input(s): GLUCAP in the last 168 hours. Lipid Profile:  Recent Labs  09/17/16 0503  CHOL 121  HDL 36*  LDLCALC 69  TRIG 80  CHOLHDL 3.4   Thyroid Function Tests: No results for input(s): TSH, T4TOTAL, FREET4, T3FREE, THYROIDAB in the last 72 hours. Anemia Panel: No results for input(s): VITAMINB12, FOLATE, FERRITIN, TIBC, IRON, RETICCTPCT in the last 72 hours. Sepsis Labs: No results for input(s): PROCALCITON, LATICACIDVEN in the last 168 hours.  No results found for this or any previous visit (from the past 240 hour(s)).       Radiology Studies: Nm Myocar Multi  W/spect W/wall Motion / Ef  Result Date: 09/18/2016 CLINICAL DATA:  Chest pain. EXAM: MYOCARDIAL IMAGING WITH SPECT (REST AND PHARMACOLOGIC-STRESS) GATED LEFT VENTRICULAR WALL MOTION STUDY LEFT VENTRICULAR EJECTION FRACTION TECHNIQUE: Standard myocardial SPECT imaging was performed after resting intravenous injection of 10 mCi Tc-65m tetrofosmin. Subsequently, intravenous infusion of Lexiscan was performed under the supervision of the Cardiology staff. At peak effect of the drug, 30 mCi Tc-4m tetrofosmin was injected intravenously and standard myocardial SPECT imaging was performed. Quantitative gated imaging was also performed to evaluate left ventricular wall motion, and estimate left ventricular ejection fraction. COMPARISON:  None. FINDINGS: Perfusion: There is a moderate size defect in the apex seen at rest and stress. While there is a fixed component, there is a small to moderate region of reversibility within this defect. Wall Motion: Poor thickening in the region of the myocardial defect. Other walls move normally. Left Ventricular Ejection Fraction: 56 % End diastolic volume 101 ml End systolic volume 44 ml IMPRESSION: 1. There is a moderate sized defect centered in the apex extending into  the low anterior wall. While there is a fixed component consistent with infarct, there is a small to moderate region demonstrating reversibility as well consistent with associated ischemia. . 2. Poor thickening in the region of the myocardial defect centered at the apex. 3. Left ventricular ejection fraction 56% 4. Non invasive risk stratification*: Intermediate *2012 Appropriate Use Criteria for Coronary Revascularization Focused Update: J Am Coll Cardiol. 2012;59(9):857-881. http://[email or web address]?[email or web address] Electronically Signed   By: Gerome Sam III M.D   On: 09/18/2016 12:56        Scheduled Meds: . aspirin  81 mg Oral Daily  . folic acid  1 mg Oral Daily  . furosemide  20  mg Oral QODAY  . heparin  5,000 Units Subcutaneous Q8H  . lactose free nutrition  237 mL Oral BID BM  . magnesium chloride  1 tablet Oral Daily  . multivitamin with minerals  1 tablet Oral Daily  . potassium chloride  10 mEq Oral QODAY  . rosuvastatin  40 mg Oral q1800   Continuous Infusions: . azithromycin 500 mg (09/19/16 1018)  . cefTRIAXone (ROCEPHIN)  IV Stopped (09/18/16 1430)     LOS: 4 days     Jacquelin Hawking, MD [facility] 09/19/2016, 10:33 AM Pager: [phone]  If 7PM-7AM, please contact night-coverage [email or web address] Password [facility] 09/19/2016, 10:33 AM

## 2016-09-19 NOTE — Progress Notes (Signed)
Pharmacy Antibiotic Note  Alicia [name] is a 83 y.o. female admitted on 09/15/2016 with pneumonia.  Pharmacy has been consulted for cefepime dosing.  Plan: For current CrCl of 15 ml/min, start cefepime 500mg  IV q24  Height: 5\' 4"  (162.6 cm) Weight: 117 lb 4.6 oz (53.2 kg) IBW/kg (Calculated) : 54.7  Temp (24hrs), Avg:99.1 F (37.3 C), Min:98 F (36.7 C), Max:100.8 F (38.2 C)   Recent Labs Lab 09/15/16 1809 09/16/16 0129 09/19/16 1051  WBC 7.5 7.0  --   CREATININE 1.50* 1.34* 2.01*    Estimated Creatinine Clearance: 15.3 mL/min (A) (by C-G formula based on SCr of 2.01 mg/dL (H)).    Allergies  Allergen Reactions  . Tylenol 8 Hour [Acetaminophen] Other (See Comments)    GI Upset  . Aspirin Nausea And Vomiting    GI Upset  . Penicillins Rash     Thank you for allowing pharmacy to be a part of this patient's care.  Hessie Knows, PharmD, BCPS Pager [phone] 09/19/2016 2:51 PM

## 2016-09-20 LAB — COMPREHENSIVE METABOLIC PANEL
ALK PHOS: 71 U/L (ref 38–126)
ALT: 13 U/L — ABNORMAL LOW (ref 14–54)
ANION GAP: 7 (ref 5–15)
AST: 29 U/L (ref 15–41)
Albumin: 2.6 g/dL — ABNORMAL LOW (ref 3.5–5.0)
BILIRUBIN TOTAL: 0.6 mg/dL (ref 0.3–1.2)
BUN: 31 mg/dL — ABNORMAL HIGH (ref 6–20)
CALCIUM: 8.6 mg/dL — AB (ref 8.9–10.3)
CO2: 20 mmol/L — ABNORMAL LOW (ref 22–32)
Chloride: 94 mmol/L — ABNORMAL LOW (ref 101–111)
Creatinine, Ser: 2.12 mg/dL — ABNORMAL HIGH (ref 0.44–1.00)
GFR calc non Af Amer: 19 mL/min — ABNORMAL LOW (ref >60)
GFR, EST AFRICAN AMERICAN: 22 mL/min — AB (ref >60)
Glucose, Bld: 126 mg/dL — ABNORMAL HIGH (ref 65–99)
Potassium: 4.6 mmol/L (ref 3.5–5.1)
SODIUM: 121 mmol/L — AB (ref 135–145)
TOTAL PROTEIN: 6.5 g/dL (ref 6.5–8.1)

## 2016-09-20 LAB — CBC
HEMATOCRIT: 27.7 % — AB (ref 36.0–46.0)
HEMOGLOBIN: 9.2 g/dL — AB (ref 12.0–15.0)
MCH: 28 pg (ref 26.0–34.0)
MCHC: 33.2 g/dL (ref 30.0–36.0)
MCV: 84.2 fL (ref 78.0–100.0)
Platelets: 247 10*3/uL (ref 150–400)
RBC: 3.29 MIL/uL — ABNORMAL LOW (ref 3.87–5.11)
RDW: 14.2 % (ref 11.5–15.5)
WBC: 10.5 10*3/uL (ref 4.0–10.5)

## 2016-09-20 LAB — STREP PNEUMONIAE URINARY ANTIGEN: STREP PNEUMO URINARY ANTIGEN: NEGATIVE

## 2016-09-20 MED ORDER — VANCOMYCIN HCL IN DEXTROSE 750-5 MG/150ML-% IV SOLN
750.0000 mg | Freq: Once | INTRAVENOUS | Status: AC
  Administered 2016-09-20: 750 mg via INTRAVENOUS
  Filled 2016-09-20: qty 150

## 2016-09-20 MED ORDER — VANCOMYCIN HCL IN DEXTROSE 750-5 MG/150ML-% IV SOLN
750.0000 mg | INTRAVENOUS | Status: DC
Start: 2016-09-22 — End: 2016-09-21

## 2016-09-20 MED ORDER — PHENOL 1.4 % MT LIQD
1.0000 | OROMUCOSAL | Status: DC | PRN
  Administered 2016-09-20: 1 via OROMUCOSAL
  Filled 2016-09-20: qty 177

## 2016-09-20 MED ORDER — POLYETHYLENE GLYCOL 3350 17 G PO PACK
17.0000 g | PACK | Freq: Every day | ORAL | Status: AC
  Administered 2016-09-20 – 2016-09-21 (×2): 17 g via ORAL
  Filled 2016-09-20 (×2): qty 1

## 2016-09-20 MED ORDER — POLYETHYLENE GLYCOL 3350 17 G PO PACK: 17.0000 g | PACK | Freq: Every day | ORAL | Status: DC

## 2016-09-20 MED ORDER — SODIUM CHLORIDE 0.9 % IV SOLN
INTRAVENOUS | Status: AC
  Administered 2016-09-20: 10:00:00 via INTRAVENOUS

## 2016-09-20 NOTE — Care Management Important Message (Signed)
Important Message  Patient Details  Name: Alicia [name] MRN: [id number] Date of Birth: [birth year]   Medicare Important Message Given:  Yes    Caren Macadam 09/20/2016, 11:42 AMImportant Message  Patient Details  Name: Alicia [name] MRN: [id number] Date of Birth: 05-21-1922   Medicare Important Message Given:  Yes    Caren Macadam 09/20/2016, 11:42 AM

## 2016-09-20 NOTE — Progress Notes (Signed)
Progress Note  Patient Name: Alicia [name] Date of Encounter: 09/20/2016  Primary Cardiologist: Dr. Swaziland  Subjective   No chest pain.  No SOB.      Inpatient Medications    Scheduled Meds: . aspirin  81 mg Oral Daily  . ceFEPime (MAXIPIME) IV  500 mg Intravenous Q24H  . folic acid  1 mg Oral Daily  . furosemide  20 mg Oral QODAY  . heparin  5,000 Units Subcutaneous Q8H  . lactose free nutrition  237 mL Oral BID BM  . magnesium chloride  1 tablet Oral Daily  . multivitamin with minerals  1 tablet Oral Daily  . polyethylene glycol  17 g Oral Daily  . potassium chloride  10 mEq Oral QODAY  . rosuvastatin  40 mg Oral q1800   Continuous Infusions: . sodium chloride 40 mL/hr at 09/20/16 1004  . azithromycin Stopped (09/19/16 1118)  . [START ON 09/22/2016] vancomycin     PRN Meds: gi cocktail, guaiFENesin-dextromethorphan, morphine injection, nitroGLYCERIN, ondansetron (ZOFRAN) IV, traMADol   Vital Signs    Vitals:   09/19/16 1320 09/19/16 2228 09/20/16 0421 09/20/16 1007  BP: (!) 96/54 100/65 (!) 122/58 (!) 106/59  Pulse: 92 97 93   Resp: 16 18 18    Temp: 98.5 F (36.9 C) 98.6 F (37 C) 99.1 F (37.3 C)   TempSrc: Oral Oral Oral   SpO2: 96% 97% 90% 93%  Weight:      Height:        Intake/Output Summary (Last 24 hours) at 09/20/16 1025 Last data filed at 09/20/16 0600  Gross per 24 hour  Intake              300 ml  Output              400 ml  Net             -100 ml   Filed Weights   09/15/16 1634 09/15/16 2146  Weight: 121 lb (54.9 kg) 117 lb 4.6 oz (53.2 kg)    Telemetry    NSR - Personally Reviewed  ECG    NA - Personally Reviewed  Physical Exam   GEN: No  acute distress.   Neck: No  JVD Cardiac: RRR, no murmurs, rubs, or gallops.  Respiratory: Clear   to auscultation bilaterally. GI: Soft, nontender, non-distended, normal bowel sounds  MS:  No edema; No deformity. Neuro:   Nonfocal  Psych: Oriented and appropriate    Labs      Chemistry  Recent Labs Lab 09/15/16 1809 09/16/16 0129 09/19/16 1051 09/20/16 0451  NA 133* 134* 125* 121*  K 4.7 4.1 4.0 4.6  CL 102 104 96* 94*  CO2 25 22 22  20*  GLUCOSE 97 89 162* 126*  BUN 31* 29* 28* 31*  CREATININE 1.50* 1.34* 2.01* 2.12*  CALCIUM 9.2 8.9 8.4* 8.6*  PROT 6.9  --   --  6.5  ALBUMIN 3.2*  --   --  2.6*  AST 28  --   --  29  ALT 10*  --   --  13*  ALKPHOS 60  --   --  71  BILITOT 0.9  --   --  0.6  GFRNONAA 29* 34* 21* 19*  GFRAA 34* 39* 24* 22*  ANIONGAP 6 8 7 7      Hematology  Recent Labs Lab 09/15/16 1809 09/16/16 0129 09/20/16 0451  WBC 7.5 7.0 10.5  RBC 3.66* 3.46* 3.29*  HGB 10.6*  10.2* 9.2*  HCT 32.1* 30.1* 27.7*  MCV 87.7 87.0 84.2  MCH 29.0 29.5 28.0  MCHC 33.0 33.9 33.2  RDW 14.3 14.4 14.2  PLT 254 245 247    Cardiac Enzymes  Recent Labs Lab 09/15/16 1809 09/16/16 0129 09/16/16 1211 09/17/16 1825  TROPONINI 0.89* 0.70* 0.38* 0.13*   No results for input(s): TROPIPOC in the last 168 hours.   BNPNo results for input(s): BNP, PROBNP in the last 168 hours.   DDimer   Recent Labs Lab 09/15/16 2019  DDIMER 7.13*     Radiology    Ct Chest Wo Contrast  Result Date: 09/19/2016 CLINICAL DATA:  Chest pain EXAM: CT CHEST WITHOUT CONTRAST TECHNIQUE: Multidetector CT imaging of the chest was performed following the standard protocol without IV contrast. COMPARISON:  Chest x-ray 09/15/2016 FINDINGS: Cardiovascular: Heart size upper normal. No pericardial effusion. No thoracic aortic aneurysm. Mediastinum/Nodes: Scattered small mediastinal lymph nodes. Calcified lymph nodes are seen in the right hilum. Abnormal soft tissue in the left hilum likely related to the associated left lung airspace disease. The esophagus has normal imaging features. There is no axillary lymphadenopathy. Lungs/Pleura: Hyperexpansion is consistent with emphysema. There is diffuse interstitial and airspace disease in the the left upper and lower lobe with  patchy central ground-glass attenuation right middle and lower lobes. 6 mm ill-defined nodule posterior right lower lobe has other associated smaller ground-glass nodules. Upper Abdomen: Unremarkable. Musculoskeletal: Bone windows reveal no worrisome lytic or sclerotic osseous lesions. IMPRESSION: 1. Diffuse interstitial and airspace disease in the left lung, most suggestive of pneumonia. Disease is substantially progressed since the chest x-ray of 09/15/2016. Electronically Signed   By: Kennith Center M.D.   On: 09/19/2016 14:23   Nm Myocar Multi W/spect W/wall Motion / Ef  Result Date: 09/18/2016 CLINICAL DATA:  Chest pain. EXAM: MYOCARDIAL IMAGING WITH SPECT (REST AND PHARMACOLOGIC-STRESS) GATED LEFT VENTRICULAR WALL MOTION STUDY LEFT VENTRICULAR EJECTION FRACTION TECHNIQUE: Standard myocardial SPECT imaging was performed after resting intravenous injection of 10 mCi Tc-58m tetrofosmin. Subsequently, intravenous infusion of Lexiscan was performed under the supervision of the Cardiology staff. At peak effect of the drug, 30 mCi Tc-84m tetrofosmin was injected intravenously and standard myocardial SPECT imaging was performed. Quantitative gated imaging was also performed to evaluate left ventricular wall motion, and estimate left ventricular ejection fraction. COMPARISON:  None. FINDINGS: Perfusion: There is a moderate size defect in the apex seen at rest and stress. While there is a fixed component, there is a small to moderate region of reversibility within this defect. Wall Motion: Poor thickening in the region of the myocardial defect. Other walls move normally. Left Ventricular Ejection Fraction: 56 % End diastolic volume 101 ml End systolic volume 44 ml IMPRESSION: 1. There is a moderate sized defect centered in the apex extending into the low anterior wall. While there is a fixed component consistent with infarct, there is a small to moderate region demonstrating reversibility as well consistent with  associated ischemia. . 2. Poor thickening in the region of the myocardial defect centered at the apex. 3. Left ventricular ejection fraction 56% 4. Non invasive risk stratification*: Intermediate *2012 Appropriate Use Criteria for Coronary Revascularization Focused Update: J Am Coll Cardiol. 2012;59(9):857-881. http://content.dementiazones.com.aspx?articleid=[email or web address] Electronically Signed   By: Gerome Sam III M.D   On: 09/18/2016 12:56    Cardiac Studies   ECHO  8/23  Study Conclusions  - Left ventricle: The cavity size was normal. Wall thickness was   increased in a pattern of mild  LVH. The estimated ejection   fraction was 55%. There appears to be apical inferior, apical   anterior, and apical septal mild hypokinesis. Doppler parameters   are consistent with abnormal left ventricular relaxation (grade 1   diastolic dysfunction). - Aortic valve: There was no stenosis. - Mitral valve: There was no significant regurgitation. - Left atrium: The atrium was moderately dilated. - Right ventricle: The cavity size was mildly dilated. Systolic   function was normal. - Tricuspid valve: There was mild-moderate regurgitation. Peak   RV-RA gradient (S): 49 mm Hg. - Pulmonary arteries: PA peak pressure: 52 mm Hg (S). - Inferior vena cava: The vessel was normal in size. The   respirophasic diameter changes were in the normal range (= 50%),   consistent with normal central venous pressure. - Pericardium, extracardiac: A trivial pericardial effusion was   identified.  Lexiscan Myoview  8/25 1. There is a moderate sized defect centered in the apex extending into the low anterior wall. While there is a fixed component consistent with infarct, there is a small to moderate region demonstrating reversibility as well consistent with associated ischemia. 2. Poor thickening in the region of the myocardial defect centered at the apex. 3. Left ventricular ejection fraction 56%   Patient  Profile     83 y.o. female witha history of a Takotsubo event in July 2016-cath then showed minor CAD. Her echo in Sept 2016 was normal. She was admitted 09/15/16 with chest pain, new anterior TWI, and elevated Troponin in the setting of emotional distress. Echo shows new apical WMA c/w Sept 2016 echo.    Assessment & Plan    TAKOTSUBO MI:  Echo as above.  Lexiscan Myoview as above.  this is not high risk.  The predominance of the defect is fixed.  This would be consistent with the pervious event.  She had only mild non obstructive disease on cath in 2016.  She would be higher risk for any invasive procedure and her symptoms are atypical.  I would suggest continued medical management.      CRI STAGE III:    Creat is stable.   Will sign off and arrange outpatient f/u with Dr Swaziland   Signed, Charlton Haws, MD  09/20/2016, 10:25 AM

## 2016-09-20 NOTE — Clinical Social Work Note (Signed)
Clinical Social Work Assessment  Patient Details  Name: Alicia [name] MRN: [id number] Date of Birth: May 17, 1922  Date of referral:  09/20/16               Reason for consult:  Facility Placement                Permission sought to share information with:  Facility Medical sales representative, Family Supports Permission granted to share information::  Yes, Verbal Permission Granted  Name::     Alicia [name]  Agency::     Relationship::  Niece/HCPOA  Contact Information:  [phone]  Housing/Transportation Living arrangements for the past 2 months:  Apartment Source of Information:  Patient, Other (Comment Required) (HCPOA - Alicia [name]) Patient Interpreter Needed:  None Criminal Activity/Legal Involvement Pertinent to Current Situation/Hospitalization:  No - Comment as needed Significant Relationships:  Siblings, Other Family Members Lives with:  Self Do you feel safe going back to the place where you live?  Yes Need for family participation in patient care:  Yes (Comment)  Care giving concerns:  Patient from home alone. PT recommending SNF for ST rehab.    Social Worker assessment / plan:  CSW spoke with patient at bedside regarding PT recommendation for ST rehab at [facility]. Patient reported that she wanted to discuss it with her family. Patient reported that she currently resides alone in a condo and that her family comes over to assist with cleaning, cooking and other errands. Patient reported that she is independent with bathing, feeding and dressing. She reported that someone comes twice a week to assist her in the home and that her brother and nephew transport her where she needs to go. Patient reports that her family is a good support and she doesn't feel like she lives alone. She reported that she has been in her current home for approx. 20 years and she likes it. Patient granted CSW verbal permission to contact her HCPOA/niece Alicia [name] [phone]). CSW agreed to follow up with  patient after she discussed PT recommendation with her family.   Patient's niece reported that she feels patient needs to go to ST rehab to get stronger prior to going back home and living alone. Patient's niece requested that CSW call patient on 3 way to inform patient about her thoughts on the recommendation. CSW contacted patient and spoke on 3 way with patient and patient's niece. Patient's niece and patient discussed PT recommendation, patient reported that she is agreeable. Patient's niece reported that they are interested in [facility] or [facility].   CSW will complete FL2 and provide bed offers. CSW will continue to follow to assist with discharge planning.   Employment status:  Retired Database administrator PT Recommendations:  Skilled Nursing Facility Information / Referral to community resources:  Skilled Nursing Facility  Patient/Family's Response to care:  Patient/patient's HCPOA agreeable to SNF for [facility].   Patient/Family's Understanding of and Emotional Response to Diagnosis, Current Treatment, and Prognosis:  Patient presented calm throughout assessment. Patient verbalized understanding of current treatment and plan to discharge to SNF for ST rehab. Patient ws initially apprehensive about ST rehab at SNF but after talking to her niece she was agreeable. Patient's niece involved in patient's care and emphasized to patient that it was short term to regain her strength prior to going back home alone.   Emotional Assessment Appearance:  Appears stated age Attitude/Demeanor/Rapport:  Other (Open) Affect (typically observed):  Calm Orientation:  Oriented to Self, Oriented  to Place, Oriented to  Time, Oriented to Situation Alcohol / Substance use:  Not Applicable Psych involvement (Current and /or in the community):  No (Comment)  Discharge Needs  Concerns to be addressed:  No discharge needs identified Readmission within the last 30 days:   No Current discharge risk:  None Barriers to Discharge:  No Barriers Identified   Antionette Poles, LCSW 09/20/2016, 4:34 PM

## 2016-09-20 NOTE — Progress Notes (Signed)
Pharmacy Antibiotic Note  Alicia [name] is a 83 y.o. female admitted on 09/15/2016 with pneumonia.  Pharmacy has been consulted for cefepime dosing.  8/27 Pt is now with worsening PNA on CT. Pharmacy to dose vancomycin.   Plan: For current CrCl of 15 ml/min, cefepime 500mg  IV q24 Vancomycin 750 mg IV q48hr  Height: 5\' 4"  (162.6 cm) Weight: 117 lb 4.6 oz (53.2 kg) IBW/kg (Calculated) : 54.7  Temp (24hrs), Avg:98.7 F (37.1 C), Min:98.5 F (36.9 C), Max:99.1 F (37.3 C)   Recent Labs Lab 09/15/16 1809 09/16/16 0129 09/19/16 1051 09/20/16 0451  WBC 7.5 7.0  --  10.5  CREATININE 1.50* 1.34* 2.01* 2.12*    Estimated Creatinine Clearance: 14.5 mL/min (A) (by C-G formula based on SCr of 2.12 mg/dL (H)).    Allergies  Allergen Reactions  . Tylenol 8 Hour [Acetaminophen] Other (See Comments)    GI Upset  . Aspirin Nausea And Vomiting    GI Upset  . Penicillins Rash     Thank you for allowing pharmacy to be a part of this patient's care.  Hessie Knows, PharmD, BCPS Pager [phone] 09/20/2016 7:06 AM

## 2016-09-20 NOTE — Evaluation (Signed)
Physical Therapy Evaluation Patient Details Name: Alicia [name] MRN: [id number] DOB: 1922/04/26 Today's Date: 09/20/2016   History of Present Illness  83 y.o. female with a history of Takotsubo cardiomyopathy, hyperlipidemia, CKD, cervical spinal stenosis, tobacco abuse, CAD and admitted for Takotsubo MI, also with CAP  Clinical Impression  Pt admitted with above diagnosis. Pt currently with functional limitations due to the deficits listed below (see PT Problem List).  Pt will benefit from skilled PT to increase their independence and safety with mobility to allow discharge to the venue listed below.  Pt assisted with ambulating in hallway and SpO2 dropped, RN notified.  Pt reports her family can assist her upon d/c since she lives alone.  If family cannot assist, recommend SNF.     Follow Up Recommendations Supervision/Assistance - 24 hour;SNF    Equipment Recommendations  Rolling walker with 5" wheels    Recommendations for Other Services       Precautions / Restrictions Precautions Precautions: Fall Precaution Comments: monitor sats      Mobility  Bed Mobility Overal bed mobility: Needs Assistance Bed Mobility: Supine to Sit     Supine to sit: Min assist     General bed mobility comments: slight assist for trunk upright  Transfers Overall transfer level: Needs assistance Equipment used: Rolling walker (2 wheeled) Transfers: Sit to/from Stand Sit to Stand: Min guard         General transfer comment: verbal cues for hand placement  Ambulation/Gait Ambulation/Gait assistance: Min guard Ambulation Distance (Feet): 160 Feet Assistive device: Rolling walker (2 wheeled) Gait Pattern/deviations: Step-through pattern;Decreased stride length;Trunk flexed     General Gait Details: verbal cues for use of RW, pt with SpO2 86% on one pulse ox and 89% on the other pulse ox on room air, however improved to 91% room air prior to leaving room; RN notified  Stairs             Wheelchair Mobility    Modified Rankin (Stroke Patients Only)       Balance Overall balance assessment: Needs assistance           Standing balance-Leahy Scale: Fair Standing balance comment: able to statically stand without support however requested RW today due to weakness and not ambulating for a few days                             Pertinent Vitals/Pain Pain Assessment: No/denies pain    Home Living Family/patient expects to be discharged to:: Private residence Living Arrangements: Alone Available Help at Discharge: Family;Available PRN/intermittently Type of Home: Apartment (Condo) Home Access: Level entry     Home Layout: One level Home Equipment: Cane - single point      Prior Function Level of Independence: Independent               Hand Dominance        Extremity/Trunk Assessment        Lower Extremity Assessment Lower Extremity Assessment: Generalized weakness    Cervical / Trunk Assessment Cervical / Trunk Assessment: Kyphotic  Communication   Communication: No difficulties  Cognition Arousal/Alertness: Awake/alert Behavior During Therapy: WFL for tasks assessed/performed Overall Cognitive Status: Within Functional Limits for tasks assessed  General Comments      Exercises     Assessment/Plan    PT Assessment Patient needs continued PT services  PT Problem List Decreased strength;Decreased mobility;Decreased activity tolerance;Decreased balance;Decreased knowledge of use of DME       PT Treatment Interventions DME instruction;Gait training;Therapeutic exercise;Therapeutic activities;Functional mobility training;Patient/family education;Balance training    PT Goals (Current goals can be found in the Care Plan section)  Acute Rehab PT Goals PT Goal Formulation: With patient Time For Goal Achievement: 09/27/16 Potential to Achieve Goals: Good     Frequency Min 3X/week   Barriers to discharge        Co-evaluation               AM-PAC PT "6 Clicks" Daily Activity  Outcome Measure Difficulty turning over in bed (including adjusting bedclothes, sheets and blankets)?: A Little Difficulty moving from lying on back to sitting on the side of the bed? : Unable Difficulty sitting down on and standing up from a chair with arms (e.g., wheelchair, bedside commode, etc,.)?: Unable Help needed moving to and from a bed to chair (including a wheelchair)?: A Little Help needed walking in hospital room?: A Little Help needed climbing 3-5 steps with a railing? : A Little 6 Click Score: 14    End of Session Equipment Utilized During Treatment: Gait belt Activity Tolerance: Patient tolerated treatment well Patient left: in chair;with chair alarm set;with call bell/phone within reach Nurse Communication: Mobility status PT Visit Diagnosis: Difficulty in walking, not elsewhere classified (R26.2)    Time: 6213-0865 PT Time Calculation (min) (ACUTE ONLY): 15 min   Charges:   PT Evaluation $PT Eval Low Complexity: 1 Low     PT G CodesZenovia Jarred, PT, DPT 09/20/2016 Pager: [phone]   Maida Sale E 09/20/2016, 12:35 PM

## 2016-09-20 NOTE — Progress Notes (Signed)
Patient ID: Alicia [name], female   DOB: [birth year], 83 y.o.   MRN: [id number]                                                                PROGRESS NOTE                                                                                                                                                                                                             Patient Demographics:    Alicia [name], is a 83 y.o. female, DOB - April 16, 1922, ATF:[id number]  Admit date - 09/15/2016   Admitting Physician Eston Esters, MD  Outpatient Primary MD for the patient is [name], MD  LOS - 5  Outpatient Specialists:     Chief Complaint  Patient presents with  . Heartburn  . Melena       Brief Narrative     83 y.o. female with a history of Takotsubo gammopathy, hyperlipidemia, CKD, tobacco abuse, CAD. She presented with chest pain and elevated troponin. Etiology consulted to help with management of possible NSTEMI     Subjective:    [place] today has pox 90% (RA) this am.  Still with sob. Slight dry cough.  + constipation,  Denies fever, chills, cp, palp, n/v, diarrhea, abd pain, brbpr.    Assessment  & Plan :    Active Problems:   NSTEMI (non-ST elevated myocardial infarction) (HCC)   Acute combined systolic and diastolic heart failure (HCC)   Elevated d-dimer   Protein-calorie malnutrition, severe   Community acquired pneumonia   NSTEMI Troponin elevated with the peak of 0.89 in setting of chest pain. Decreased T waves. No current chest pain. Trended down. Echocardiogram significant for apical inferior/anterior/septal mild hypokinesis with mildly dilated ventricle and elevated PA pressure of 52 mmHg; there is evidence of grade 1 diastolic dysfunction. -Continue aspirin -Myoview EF 56%, small to moderate regionl demonstrating reversibility as well consistent with associated ischemia -per cardiology no indication for catheterization at this time, risk outweighs  benefit -morphine prn  Chest pain Episode 8/24. Troponin trended down. Repeat EKG largely unchanged. Improved after nitro. -morphine prn -GI cocktail daily prn -nitro prn  79mm lung nodule in the RLL on CT chest 8/26 Will need outpatient follow up  Elevated d-dimer Low suspicion for  PE. V/Q low risk. Venous dopplers negative for DVT.  ARF on CKD Hydrate with ns iv  Anemia Normocytic. Stable. Patient reports dark stools on admission. No hematochezia. Fecal occult blood test negative.  Hyperlipidemia Not on statin. LDL of 69 on 8/24  Decreased by mouth intake  -Dietitian consult  Diastolic heart dysfunction Seen on echocardiogram. EF of 55%. Grade 1 dysfunction.  Community acquired pneumonia Infiltrate seen on chest x-ray. Left-sided. Patient with cough and sputum production. Afebrile. No leukocytosis. It is possible this is atelectasis versus malignancy rather than pneumonia, but will treat initially. Follow-up chest x-ray in 3 weeks, and if still persistent, may decide to pursue further. CT chest 8/26=> IMPRESSION: 1. Diffuse interstitial and airspace disease in the left lung, most suggestive of pneumonia. Disease is substantially progressed since the chest x-ray of 09/15/2016.  Add vanco iv pharmacy to dose 8/27 Rocephin=> Cefepime iv (8/26) Zithromax iv 8/24 =>  Severe malnutrition -Dietary recommendations: supplementation and mutlivitamin  Constipation miralax qday x 2 doses   DVT prophylaxis: Heparin subq Code Status: DNR (Clarified with patient and POA) Family Communication: Cousin at bedside Disposition Plan: Discharge when stable  Consultants:   Cardiology  Procedures:   Echocardiogram (8/23)  Antimicrobials:  Rocephin 8/24> 8/26  Zithromax 8/24=>  Cefepime 8/26=>  Vanco 8/27=>    Anti-infectives    Start     Dose/Rate Route Frequency Ordered Stop   09/19/16 1800  ceFEPIme (MAXIPIME) 500 mg in dextrose 5 % 50 mL IVPB      500 mg 100 mL/hr over 30 Minutes Intravenous Every 24 hours 09/19/16 1452     09/17/16 1200  cefTRIAXone (ROCEPHIN) 1 g in dextrose 5 % 50 mL IVPB  Status:  Discontinued     1 g 100 mL/hr over 30 Minutes Intravenous Every 24 hours 09/17/16 1006 09/19/16 1436   09/17/16 1100  azithromycin (ZITHROMAX) 500 mg in dextrose 5 % 250 mL IVPB     500 mg 250 mL/hr over 60 Minutes Intravenous Every 24 hours 09/17/16 1006          Objective:   Vitals:   09/19/16 0100 09/19/16 1320 09/19/16 2228 09/20/16 0421  BP: (!) 102/58 (!) 96/54 100/65 (!) 122/58  Pulse: 94 92 97 93  Resp:  16 18 18   Temp:  98.5 F (36.9 C) 98.6 F (37 C) 99.1 F (37.3 C)  TempSrc:  Oral Oral Oral  SpO2:  96% 97% 90%  Weight:      Height:        Wt Readings from Last 3 Encounters:  09/15/16 53.2 kg (117 lb 4.6 oz)  01/15/15 59.1 kg (130 lb 6.4 oz)  10/21/14 58.7 kg (129 lb 6.4 oz)     Intake/Output Summary (Last 24 hours) at 09/20/16 0449 Last data filed at 09/20/16 0423  Gross per 24 hour  Intake              540 ml  Output              400 ml  Net              140 ml     Physical Exam  Awake Alert, Oriented X 3, No new F.N deficits, Normal affect [place].AT,PERRAL Supple Neck,No JVD, No cervical lymphadenopathy appriciated.  Symmetrical Chest wall movement, Good air movement bilaterally, crackles bilaterally about 1/4 up, no wheezing.  RRR,No Gallops,Rubs or new Murmurs, No Parasternal Heave +ve B.Sounds, Abd Soft, No tenderness, No organomegaly appriciated, No rebound -  guarding or rigidity. No Cyanosis, Clubbing or edema, No new Rash or bruise     Data Review:    CBC  Recent Labs Lab 09/15/16 1809 09/16/16 0129  WBC 7.5 7.0  HGB 10.6* 10.2*  HCT 32.1* 30.1*  PLT 254 245  MCV 87.7 87.0  MCH 29.0 29.5  MCHC 33.0 33.9  RDW 14.3 14.4  LYMPHSABS 1.1  --   MONOABS 0.4  --   EOSABS 0.1  --   BASOSABS 0.0  --     Chemistries   Recent Labs Lab 09/15/16 1809 09/16/16 0129  09/19/16 1051  NA 133* 134* 125*  K 4.7 4.1 4.0  CL 102 104 96*  CO2 25 22 22   GLUCOSE 97 89 162*  BUN 31* 29* 28*  CREATININE 1.50* 1.34* 2.01*  CALCIUM 9.2 8.9 8.4*  AST 28  --   --   ALT 10*  --   --   ALKPHOS 60  --   --   BILITOT 0.9  --   --    ------------------------------------------------------------------------------------------------------------------  Recent Labs  09/17/16 0503  CHOL 121  HDL 36*  LDLCALC 69  TRIG 80  CHOLHDL 3.4    Lab Results  Component Value Date   HGBA1C 5.4 08/16/2014   ------------------------------------------------------------------------------------------------------------------ No results for input(s): TSH, T4TOTAL, T3FREE, THYROIDAB in the last 72 hours.  Invalid input(s): FREET3 ------------------------------------------------------------------------------------------------------------------ No results for input(s): VITAMINB12, FOLATE, FERRITIN, TIBC, IRON, RETICCTPCT in the last 72 hours.  Coagulation profile No results for input(s): INR, PROTIME in the last 168 hours.  No results for input(s): DDIMER in the last 72 hours.  Cardiac Enzymes  Recent Labs Lab 09/16/16 0129 09/16/16 1211 09/17/16 1825  TROPONINI 0.70* 0.38* 0.13*   ------------------------------------------------------------------------------------------------------------------ No results found for: BNP  Inpatient Medications  Scheduled Meds: . aspirin  81 mg Oral Daily  . ceFEPime (MAXIPIME) IV  500 mg Intravenous Q24H  . folic acid  1 mg Oral Daily  . furosemide  20 mg Oral QODAY  . heparin  5,000 Units Subcutaneous Q8H  . lactose free nutrition  237 mL Oral BID BM  . magnesium chloride  1 tablet Oral Daily  . multivitamin with minerals  1 tablet Oral Daily  . potassium chloride  10 mEq Oral QODAY  . rosuvastatin  40 mg Oral q1800   Continuous Infusions: . azithromycin Stopped (09/19/16 1118)   PRN Meds:.gi cocktail,  guaiFENesin-dextromethorphan, morphine injection, nitroGLYCERIN, ondansetron (ZOFRAN) IV, traMADol  Micro Results Recent Results (from the past 240 hour(s))  MRSA PCR Screening     Status: None   Collection Time: 09/19/16  3:37 PM  Result Value Ref Range Status   MRSA by PCR NEGATIVE NEGATIVE Final    Comment:        The GeneXpert MRSA Assay (FDA approved for NASAL specimens only), is one component of a comprehensive MRSA colonization surveillance program. It is not intended to diagnose MRSA infection nor to guide or monitor treatment for MRSA infections.   Culture, expectorated sputum-assessment     Status: None   Collection Time: 09/19/16  3:54 PM  Result Value Ref Range Status   Specimen Description SPUTUM  Final   Special Requests Normal  Final   Sputum evaluation THIS SPECIMEN IS ACCEPTABLE FOR SPUTUM CULTURE  Final   Report Status 09/19/2016 FINAL  Final    Radiology Reports Dg Chest 2 View  Result Date: 09/15/2016 CLINICAL DATA:  Chest pain. EXAM: CHEST  2 VIEW COMPARISON:  Radiograph August 20, 2014. FINDINGS: Stable cardiomediastinal silhouette. Atherosclerosis of thoracic aorta is noted. No pneumothorax or pleural effusion is noted. Left midlung opacity is noted concerning for possible pneumonia or inflammation. Multilevel degenerative changes are noted in the thoracic spine. IMPRESSION: Focal left midlung opacity is noted concerning for pneumonia for atelectasis. Followup PA and lateral chest X-ray is recommended in 3-4 weeks following trial of antibiotic therapy to ensure resolution and exclude underlying malignancy. Electronically Signed   By: Lupita Raider, M.D.   On: 09/15/2016 19:30   Ct Chest Wo Contrast  Result Date: 09/19/2016 CLINICAL DATA:  Chest pain EXAM: CT CHEST WITHOUT CONTRAST TECHNIQUE: Multidetector CT imaging of the chest was performed following the standard protocol without IV contrast. COMPARISON:  Chest x-ray 09/15/2016 FINDINGS: Cardiovascular:  Heart size upper normal. No pericardial effusion. No thoracic aortic aneurysm. Mediastinum/Nodes: Scattered small mediastinal lymph nodes. Calcified lymph nodes are seen in the right hilum. Abnormal soft tissue in the left hilum likely related to the associated left lung airspace disease. The esophagus has normal imaging features. There is no axillary lymphadenopathy. Lungs/Pleura: Hyperexpansion is consistent with emphysema. There is diffuse interstitial and airspace disease in the the left upper and lower lobe with patchy central ground-glass attenuation right middle and lower lobes. 6 mm ill-defined nodule posterior right lower lobe has other associated smaller ground-glass nodules. Upper Abdomen: Unremarkable. Musculoskeletal: Bone windows reveal no worrisome lytic or sclerotic osseous lesions. IMPRESSION: 1. Diffuse interstitial and airspace disease in the left lung, most suggestive of pneumonia. Disease is substantially progressed since the chest x-ray of 09/15/2016. Electronically Signed   By: Kennith Center M.D.   On: 09/19/2016 14:23   Nm Myocar Multi W/spect W/wall Motion / Ef  Result Date: 09/18/2016 CLINICAL DATA:  Chest pain. EXAM: MYOCARDIAL IMAGING WITH SPECT (REST AND PHARMACOLOGIC-STRESS) GATED LEFT VENTRICULAR WALL MOTION STUDY LEFT VENTRICULAR EJECTION FRACTION TECHNIQUE: Standard myocardial SPECT imaging was performed after resting intravenous injection of 10 mCi Tc-61m tetrofosmin. Subsequently, intravenous infusion of Lexiscan was performed under the supervision of the Cardiology staff. At peak effect of the drug, 30 mCi Tc-35m tetrofosmin was injected intravenously and standard myocardial SPECT imaging was performed. Quantitative gated imaging was also performed to evaluate left ventricular wall motion, and estimate left ventricular ejection fraction. COMPARISON:  None. FINDINGS: Perfusion: There is a moderate size defect in the apex seen at rest and stress. While there is a fixed  component, there is a small to moderate region of reversibility within this defect. Wall Motion: Poor thickening in the region of the myocardial defect. Other walls move normally. Left Ventricular Ejection Fraction: 56 % End diastolic volume 101 ml End systolic volume 44 ml IMPRESSION: 1. There is a moderate sized defect centered in the apex extending into the low anterior wall. While there is a fixed component consistent with infarct, there is a small to moderate region demonstrating reversibility as well consistent with associated ischemia. . 2. Poor thickening in the region of the myocardial defect centered at the apex. 3. Left ventricular ejection fraction 56% 4. Non invasive risk stratification*: Intermediate *2012 Appropriate Use Criteria for Coronary Revascularization Focused Update: J Am Coll Cardiol. 2012;59(9):857-881. http://content.dementiazones.com.aspx?articleid=[email or web address] Electronically Signed   By: Gerome Sam III M.D   On: 09/18/2016 12:56   Nm Pulmonary Perf And Vent  Result Date: 09/16/2016 CLINICAL DATA:  Chest pain with elevated D-dimer EXAM: NUCLEAR MEDICINE VENTILATION - PERFUSION LUNG SCAN VIEWS: Anterior, posterior, left lateral, right lateral, RPO, LPO, RAO, LAO -ventilation and profusion RADIOPHARMACEUTICALS:  32.7  mCi Technetium-17m DTPA aerosol inhalation and 3.8 mCi Technetium-69m MAA IV COMPARISON:  Chest radiograph September 15, 2016. FINDINGS: Ventilation: There is no segmental or significant subsegmental ventilation defect. A few rather minimal areas of inhomogeneity are noted on the ventilation study, likely due to what appears to be a degree of underlying COPD. Perfusion: There is no segmental or significant subsegmental perfusion defect. There are a few rather tiny matching ventilation and profusion defects, likely due to underlying COPD. No significant ventilation/ perfusion mismatch. IMPRESSION: Findings suggesting a degree of underlying COPD. No appreciable ventilation/  perfusion mismatch. No segmental or significant subsegmental perfusion defects. This study constitutes a low probability of pulmonary embolus. Electronically Signed   By: Bretta Bang III M.D.   On: 09/16/2016 11:49    Time Spent in minutes  30   Pearson Grippe M.D on 09/20/2016 at 4:49 AM  Between 7am to 7pm - Pager - [phone]     After 7pm go to [email or web address] - password [facility]  [facility] -  Office  [phone]

## 2016-09-20 NOTE — Progress Notes (Signed)
IV infiltrated this morning at 0827 on pt's left forarm. IV removed and ice placed on site. IV team placed another site. Large knot assessed on pt's right ac. Ice placed on area. Will continue to monitor both sites.

## 2016-09-21 ENCOUNTER — Inpatient Hospital Stay (HOSPITAL_COMMUNITY): Payer: Medicare Other

## 2016-09-21 DIAGNOSIS — R062 Wheezing: Secondary | ICD-10-CM

## 2016-09-21 DIAGNOSIS — E872 Acidosis: Secondary | ICD-10-CM

## 2016-09-21 DIAGNOSIS — E871 Hypo-osmolality and hyponatremia: Secondary | ICD-10-CM

## 2016-09-21 LAB — BASIC METABOLIC PANEL
Anion gap: 9 (ref 5–15)
Anion gap: 9 (ref 5–15)
Anion gap: 10 (ref 5–15)
BUN: 37 mg/dL — AB (ref 6–20)
BUN: 37 mg/dL — AB (ref 6–20)
BUN: 39 mg/dL — AB (ref 6–20)
CALCIUM: 8.5 mg/dL — AB (ref 8.9–10.3)
CHLORIDE: 91 mmol/L — AB (ref 101–111)
CHLORIDE: 89 mmol/L — AB (ref 101–111)
CO2: 19 mmol/L — ABNORMAL LOW (ref 22–32)
CO2: 19 mmol/L — AB (ref 22–32)
CO2: 18 mmol/L — AB (ref 22–32)
CREATININE: 3.17 mg/dL — AB (ref 0.44–1.00)
CREATININE: 3.2 mg/dL — AB (ref 0.44–1.00)
Calcium: 8.6 mg/dL — ABNORMAL LOW (ref 8.9–10.3)
Calcium: 8.3 mg/dL — ABNORMAL LOW (ref 8.9–10.3)
Chloride: 92 mmol/L — ABNORMAL LOW (ref 101–111)
Creatinine, Ser: 3.59 mg/dL — ABNORMAL HIGH (ref 0.44–1.00)
GFR calc Af Amer: 14 mL/min — ABNORMAL LOW (ref >60)
GFR calc Af Amer: 14 mL/min — ABNORMAL LOW (ref >60)
GFR calc Af Amer: 12 mL/min — ABNORMAL LOW (ref >60)
GFR calc non Af Amer: 12 mL/min — ABNORMAL LOW (ref >60)
GFR calc non Af Amer: 10 mL/min — ABNORMAL LOW (ref >60)
GFR, EST NON AFRICAN AMERICAN: 12 mL/min — AB (ref >60)
GLUCOSE: 96 mg/dL (ref 65–99)
GLUCOSE: 116 mg/dL — AB (ref 65–99)
Glucose, Bld: 115 mg/dL — ABNORMAL HIGH (ref 65–99)
POTASSIUM: 4.8 mmol/L (ref 3.5–5.1)
POTASSIUM: 4.9 mmol/L (ref 3.5–5.1)
Potassium: 4.8 mmol/L (ref 3.5–5.1)
SODIUM: 120 mmol/L — AB (ref 135–145)
SODIUM: 119 mmol/L — AB (ref 135–145)
SODIUM: 117 mmol/L — AB (ref 135–145)

## 2016-09-21 LAB — OSMOLALITY: Osmolality: 259 mOsm/kg — ABNORMAL LOW (ref 275–295)

## 2016-09-21 LAB — CREATININE, SERUM
CREATININE: 2.82 mg/dL — AB (ref 0.44–1.00)
GFR calc Af Amer: 16 mL/min — ABNORMAL LOW (ref >60)
GFR calc non Af Amer: 14 mL/min — ABNORMAL LOW (ref >60)

## 2016-09-21 LAB — TSH: TSH: 3.162 u[IU]/mL (ref 0.350–4.500)

## 2016-09-21 LAB — OSMOLALITY, URINE: OSMOLALITY UR: 274 mosm/kg — AB (ref 300–900)

## 2016-09-21 LAB — SODIUM, URINE, RANDOM: Sodium, Ur: 23 mmol/L

## 2016-09-21 MED ORDER — ALBUTEROL SULFATE (2.5 MG/3ML) 0.083% IN NEBU
2.5000 mg | INHALATION_SOLUTION | RESPIRATORY_TRACT | Status: DC | PRN
  Administered 2016-09-21 (×2): 2.5 mg via RESPIRATORY_TRACT
  Filled 2016-09-21 (×2): qty 3

## 2016-09-21 MED ORDER — ACETAMINOPHEN 325 MG PO TABS
650.0000 mg | ORAL_TABLET | Freq: Four times a day (QID) | ORAL | Status: DC | PRN
  Filled 2016-09-21: qty 2

## 2016-09-21 MED ORDER — BOOST PLUS PO LIQD
237.0000 mL | Freq: Three times a day (TID) | ORAL | Status: DC
  Administered 2016-09-21 – 2016-09-24 (×5): 237 mL via ORAL
  Filled 2016-09-21 (×14): qty 237

## 2016-09-21 MED ORDER — SODIUM CHLORIDE 0.9 % IV SOLN
INTRAVENOUS | Status: DC
  Administered 2016-09-21: 13:00:00 via INTRAVENOUS

## 2016-09-21 NOTE — Progress Notes (Signed)
CRITICAL VALUE ALERT  Critical Value:  Sodium 117  Date & Time Notied: 09/21/16, time: 2145  Provider Notified: Merdis Delay  Orders Received/Actions taken: see new orders

## 2016-09-21 NOTE — Progress Notes (Signed)
CRITICAL VALUE ALERT  Critical Value:  Na 119  Date & Time Notied:  09/21/16 @ 1210  Provider Notified: 09/21/16 @ 1215  Orders Received/Actions taken: 09/21/16 @ 1230

## 2016-09-21 NOTE — NC FL2 (Signed)
[place] MEDICAID FL2 LEVEL OF CARE SCREENING TOOL     IDENTIFICATION  Patient Name: Alicia [name] Birthdate: 06-Sep-1922 Sex: female Admission Date (Current Location): 09/15/2016  [facility] and [place] Number:  Producer, television/film/video and Address:  [facility],  [address], [place] [address]      Provider Number: [id number]  Attending Physician Name and Address:  Narda Bonds, MD  Relative Name and Phone Number:  Simmie Davies [phone]    Current Level of Care: Hospital Recommended Level of Care: Skilled Nursing Facility Prior Approval Number:    Date Approved/Denied:   PASRR Number: [id number]  Discharge Plan: SNF    Current Diagnoses: Patient Active Problem List   Diagnosis Date Noted  . Community acquired pneumonia 09/19/2016  . Protein-calorie malnutrition, severe 09/17/2016  . Acute combined systolic and diastolic heart failure (HCC)   . Elevated d-dimer   . Coronary artery disease involving native coronary artery of native heart without angina pectoris 08/23/2014  . Essential hypertension 08/23/2014  . Hyperlipidemia 08/23/2014  . Takotsubo syndrome 08/17/2014  . Cervical spinal cord compression (HCC)   . CKD (chronic kidney disease)   . Spinal stenosis in cervical region 08/16/2014  . NSTEMI (non-ST elevated myocardial infarction) (HCC) 08/16/2014  . AKI (acute kidney injury) (HCC) 08/15/2014  . Right hand weakness 08/15/2014  . Anemia 08/15/2014  . Peripheral edema 04/21/2014  . Dyspnea 04/15/2014  . Upper airway cough syndrome 10/08/2013  . Postinflammatory pulmonary fibrosis (HCC) 10/08/2013    Orientation RESPIRATION BLADDER Height & Weight     Self, Time, Situation, Place  Normal Continent Weight: 117 lb 4.6 oz (53.2 kg) Height:  5\' 4"  (162.6 cm)  BEHAVIORAL SYMPTOMS/MOOD NEUROLOGICAL BOWEL NUTRITION STATUS        Diet (regular)  AMBULATORY STATUS COMMUNICATION OF NEEDS Skin   Limited Assist Verbally Normal                      Personal Care Assistance Level of Assistance  Bathing, Feeding, Dressing Bathing Assistance: Limited assistance Feeding assistance: Independent Dressing Assistance: Limited assistance     Functional Limitations Info             SPECIAL CARE FACTORS FREQUENCY  PT (By licensed PT), OT (By licensed OT)     PT Frequency: 5x OT Frequency: 5x            Contractures Contractures Info: Not present    Additional Factors Info  Code Status, Allergies Code Status Info: DNR Allergies Info: Tylenol 8 Hour Acetaminophen,Aspirin           Current Medications (09/21/2016):  This is the current hospital active medication list Current Facility-Administered Medications  Medication Dose Route Frequency Provider Last Rate Last Dose  . acetaminophen (TYLENOL) tablet 650 mg  650 mg Oral Q6H PRN Schorr, Roma Kayser, NP      . aspirin chewable tablet 81 mg  81 mg Oral Daily Eston Esters, MD   81 mg at 09/20/16 1002  . azithromycin (ZITHROMAX) 500 mg in dextrose 5 % 250 mL IVPB  500 mg Intravenous Q24H Narda Bonds, MD   Stopped at 09/20/16 1259  . ceFEPIme (MAXIPIME) 500 mg in dextrose 5 % 50 mL IVPB  500 mg Intravenous Q24H Hessie Knows, RPH   500 mg at 09/20/16 1847  . folic acid (FOLVITE) tablet 1 mg  1 mg Oral Daily Eston Esters, MD   1 mg at 09/20/16 1002  .  gi cocktail (Maalox,Lidocaine,Donnatal)  30 mL Oral Daily PRN Narda Bonds, MD   30 mL at 09/20/16 1731  . guaiFENesin-dextromethorphan (ROBITUSSIN DM) 100-10 MG/5ML syrup 15 mL  15 mL Oral Q4H PRN Narda Bonds, MD   15 mL at 09/19/16 2130  . heparin injection 5,000 Units  5,000 Units Subcutaneous Q8H Aleda Grana, RPH   5,000 Units at 09/21/16 0502  . lactose free nutrition (BOOST PLUS) liquid 237 mL  237 mL Oral BID BM Narda Bonds, MD   237 mL at 09/20/16 1451  . magnesium chloride (SLOW-MAG) 64 MG SR tablet 64 mg  1 tablet Oral Daily Eston Esters, MD   64 mg at 09/20/16 1002  . morphine 2 MG/ML  injection 2 mg  2 mg Intravenous Q4H PRN Eston Esters, MD   2 mg at 09/18/16 1849  . multivitamin with minerals tablet 1 tablet  1 tablet Oral Daily Narda Bonds, MD   1 tablet at 09/20/16 1002  . nitroGLYCERIN (NITROSTAT) SL tablet 0.4 mg  0.4 mg Sublingual Q5 Min x 3 PRN Eston Esters, MD   0.4 mg at 09/17/16 1818  . ondansetron (ZOFRAN) injection 4 mg  4 mg Intravenous Q6H PRN Eston Esters, MD   4 mg at 09/20/16 1342  . phenol (CHLORASEPTIC) mouth spray 1 spray  1 spray Mouth/Throat PRN Pearson Grippe, MD   1 spray at 09/20/16 1816  . polyethylene glycol (MIRALAX / GLYCOLAX) packet 17 g  17 g Oral Daily Pearson Grippe, MD   17 g at 09/20/16 1000  . rosuvastatin (CRESTOR) tablet 40 mg  40 mg Oral q1800 Eston Esters, MD   40 mg at 09/20/16 1731  . traMADol (ULTRAM) tablet 50 mg  50 mg Oral Q12H PRN Narda Bonds, MD         Discharge Medications: Please see discharge summary for a list of discharge medications.  Relevant Imaging Results:  Relevant Lab Results:   Additional Information SSN [id number]  Antionette Poles, LCSW

## 2016-09-21 NOTE — Progress Notes (Signed)
PROGRESS NOTE    Alicia [name]  MVV:[id number] DOB: 1922-12-20 DOA: 09/15/2016 PCP: [name], MD   Brief Narrative: Alicia [name] is a 83 y.o. female with a history of Takotsubo gammopathy, hyperlipidemia, CKD, tobacco abuse, CAD. She presented with chest pain and elevated troponin. Etiology consulted to help with management of possible NSTEMI   Assessment & Plan:   Active Problems:   NSTEMI (non-ST elevated myocardial infarction) (HCC)   Acute combined systolic and diastolic heart failure (HCC)   Elevated d-dimer   Protein-calorie malnutrition, severe   Community acquired pneumonia   NSTEMI Troponin elevated with the peak of 0.89 in setting of chest pain. Decreased T waves. No current chest pain. Trended down. Echocardiogram significant for apical inferior/anterior/septal mild hypokinesis with mildly dilated ventricle and elevated PA pressure of 52 mmHg; there is evidence of grade 1 diastolic dysfunction. Myoview significant for a defect with a fixed component. Likely Takotsubo MI per cardiology assessment. Recommending medical management. -Continue aspirin -Cardiology recommendations -nitro prn -PT recs: SNF  Chest pain Likely secondary to pneumonia. -GI cocktail daily prn -nitro prn  Elevated d-dimer Low suspicion for PE. V/Q low risk. Venous dopplers negative for DVT.  CKD Baseline. -repeat BMP today  Anemia Normocytic. Stable. Patient reports dark stools on admission. No hematochezia. Fecal occult blood test negative.  Hyperlipidemia Not on statin. LDL of 69 on 8/24  Decreased by mouth intake  -Dietitian consult  Diastolic heart dysfunction Seen on echocardiogram. EF of 55%. Grade 1 dysfunction.  Community acquired pneumonia Worsened on CT scan. Mild elevated temperature without fever. -continue Cefepime/azithromycin -sputum culture pending  Severe malnutrition -Dietary recommendations: supplementation and multivitamin  Dyspnea Likely  secondary to pneumonia. -oxygen prn hypoxemia   Acute hyponatremia Severe. Possibly secondary to lasix diuresis. No hypervolemia on exam. -urine osmolality/sodium -serum osmolality -q6 hours BMP -NS IV fluids -transfer to stepdown if BMP not improved.  Acute kidney injury Worsened yesterday into today. Possibly secondary to hypovolemia from diuresis. No hypotension. Does not appear hypervolemic on exam. -BMP as above -IV fluids  Metabolic acidosis Secondary to kidney injury. -BMP as above   DVT prophylaxis: Heparin subq Code Status: DNR (Clarified with patient and POA) Family Communication: None at bedside Disposition Plan: SNF. Social worker consulted.   Consultants:   Cardiology  Procedures:   Echocardiogram (8/23)  Myoview (8/25)  Antimicrobials:  None    Subjective: No chest pain overnight. Dyspneic.  Objective: Vitals:   09/21/16 0505 09/21/16 0700 09/21/16 1111 09/21/16 1156  BP: (!) 129/57     Pulse: (!) 106     Resp: 16     Temp: 100.1 F (37.8 C) 99.7 F (37.6 C)    TempSrc: Oral Oral    SpO2: 90%  (!) 88%   Weight:    57.8 kg (127 lb 6.8 oz)  Height:        Intake/Output Summary (Last 24 hours) at 09/21/16 1304 Last data filed at 09/20/16 1900  Gross per 24 hour  Intake           447.33 ml  Output              150 ml  Net           297.33 ml   Filed Weights   09/15/16 1634 09/15/16 2146 09/21/16 1156  Weight: 54.9 kg (121 lb) 53.2 kg (117 lb 4.6 oz) 57.8 kg (127 lb 6.8 oz)    Examination:  General exam: Calm and comfortable. Respiratory system: Wheezing  bilaterally. No increased work of breathing. No tachypnea. Cardiovascular system: Regular rate and rhythm. Normal S1 and S2. No heart murmurs present. No extra heart sounds Gastrointestinal system: Soft, non-tender, non-distended, no guarding, no rebound, no masses felt. Normal bowel sounds Central nervous system: Alert and oriented. Extremities: No edema. No calf  tenderness. Skin: No cyanosis. No rashes Psychiatry: Judgement and insight appear normal. Mood & affect appropriate.     Data Reviewed: I have personally reviewed following labs and imaging studies  CBC:  Recent Labs Lab 09/15/16 1809 09/16/16 0129 09/20/16 0451  WBC 7.5 7.0 10.5  NEUTROABS 6.0  --   --   HGB 10.6* 10.2* 9.2*  HCT 32.1* 30.1* 27.7*  MCV 87.7 87.0 84.2  PLT 254 245 247   Basic Metabolic Panel:  Recent Labs Lab 09/16/16 0129 09/19/16 1051 09/20/16 0451 09/21/16 0418 09/21/16 0730 09/21/16 1117  NA 134* 125* 121*  --  120* 119*  K 4.1 4.0 4.6  --  4.8 4.8  CL 104 96* 94*  --  92* 91*  CO2 22 22 20*  --  19* 19*  GLUCOSE 89 162* 126*  --  96 116*  BUN 29* 28* 31*  --  37* 37*  CREATININE 1.34* 2.01* 2.12* 2.82* 3.17* 3.20*  CALCIUM 8.9 8.4* 8.6*  --  8.5* 8.6*   GFR: Estimated Creatinine Clearance: 9.9 mL/min (A) (by C-G formula based on SCr of 3.2 mg/dL (H)). Liver Function Tests:  Recent Labs Lab 09/15/16 1809 09/20/16 0451  AST 28 29  ALT 10* 13*  ALKPHOS 60 71  BILITOT 0.9 0.6  PROT 6.9 6.5  ALBUMIN 3.2* 2.6*   No results for input(s): LIPASE, AMYLASE in the last 168 hours. No results for input(s): AMMONIA in the last 168 hours. Coagulation Profile: No results for input(s): INR, PROTIME in the last 168 hours. Cardiac Enzymes:  Recent Labs Lab 09/15/16 1809 09/16/16 0129 09/16/16 1211 09/17/16 1825  TROPONINI 0.89* 0.70* 0.38* 0.13*   BNP (last 3 results) No results for input(s): PROBNP in the last 8760 hours. HbA1C: No results for input(s): HGBA1C in the last 72 hours. CBG: No results for input(s): GLUCAP in the last 168 hours. Lipid Profile: No results for input(s): CHOL, HDL, LDLCALC, TRIG, CHOLHDL, LDLDIRECT in the last 72 hours. Thyroid Function Tests: No results for input(s): TSH, T4TOTAL, FREET4, T3FREE, THYROIDAB in the last 72 hours. Anemia Panel: No results for input(s): VITAMINB12, FOLATE, FERRITIN, TIBC,  IRON, RETICCTPCT in the last 72 hours. Sepsis Labs: No results for input(s): PROCALCITON, LATICACIDVEN in the last 168 hours.  Recent Results (from the past 240 hour(s))  MRSA PCR Screening     Status: None   Collection Time: 09/19/16  3:37 PM  Result Value Ref Range Status   MRSA by PCR NEGATIVE NEGATIVE Final    Comment:        The GeneXpert MRSA Assay (FDA approved for NASAL specimens only), is one component of a comprehensive MRSA colonization surveillance program. It is not intended to diagnose MRSA infection nor to guide or monitor treatment for MRSA infections.   Culture, expectorated sputum-assessment     Status: None   Collection Time: 09/19/16  3:54 PM  Result Value Ref Range Status   Specimen Description SPUTUM  Final   Special Requests Normal  Final   Sputum evaluation THIS SPECIMEN IS ACCEPTABLE FOR SPUTUM CULTURE  Final   Report Status 09/19/2016 FINAL  Final  Culture, respiratory (NON-Expectorated)     Status: None (  Preliminary result)   Collection Time: 09/19/16  3:54 PM  Result Value Ref Range Status   Specimen Description SPUTUM  Final   Special Requests Normal Reflexed from [id number]  Final   Gram Stain   Final    RARE WBC PRESENT, PREDOMINANTLY PMN RARE GRAM NEGATIVE RODS RARE GRAM POSITIVE COCCI IN PAIRS RARE YEAST RARE SQUAMOUS EPITHELIAL CELLS PRESENT    Culture   Final    CULTURE REINCUBATED FOR BETTER GROWTH Performed at [facility], [address], [place], [place] [address]    Report Status PENDING  Incomplete         Radiology Studies: Dg Chest Port 1 View  Result Date: 09/21/2016 CLINICAL DATA:  Wheezing, followup EXAM: PORTABLE CHEST 1 VIEW COMPARISON:  Chest x-ray of 09/19/2016 and chest x-ray of 09/15/2016 FINDINGS: There has been some minimal improvement in the extensive airspace disease throughout much of the left lung. Some opacities remain peripherally in the left mid lung and at the left lung base. There do appear to be  small pleural effusions present with bibasilar linear atelectasis noted. Cardiomegaly is stable. No acute bony abnormality is seen. IMPRESSION: 1. Some interval improvement in left lung airspace disease when compared to the prior CT. 2. Persistent opacities in the left mid lung and left lung base consistent with residual pneumonia. 3. Small pleural effusions Electronically Signed   By: Dwyane Dee M.D.   On: 09/21/2016 11:14        Scheduled Meds: . aspirin  81 mg Oral Daily  . ceFEPime (MAXIPIME) IV  500 mg Intravenous Q24H  . folic acid  1 mg Oral Daily  . heparin  5,000 Units Subcutaneous Q8H  . lactose free nutrition  237 mL Oral TID BM  . magnesium chloride  1 tablet Oral Daily  . multivitamin with minerals  1 tablet Oral Daily  . rosuvastatin  40 mg Oral q1800   Continuous Infusions: . sodium chloride 75 mL/hr at 09/21/16 1234  . azithromycin Stopped (09/21/16 1059)     LOS: 6 days     Jacquelin Hawking, MD [facility] 09/21/2016, 1:04 PM Pager: [phone]  If 7PM-7AM, please contact night-coverage [email or web address] Password [email or web address] 09/21/2016, 1:04 PM

## 2016-09-21 NOTE — Progress Notes (Signed)
Nutrition Follow-up  DOCUMENTATION CODES:   Severe malnutrition in context of chronic illness  INTERVENTION:  - Continue Boost Plus but will increase to TID.  - Continue to encourage PO intakes of meals and supplements. - Will monitor for improvement in nausea which may improve appetite and intakes. - Will monitor for additional nutrition-related needs. - Will order weight be obtained today.   NUTRITION DIAGNOSIS:   Malnutrition (severe) related to chronic illness (CAD, CKD, advanced age) as evidenced by severe depletion of muscle mass, severe depletion of body fat. -ongoing  GOAL:   Patient will meet greater than or equal to 90% of their needs -unmet  MONITOR:   PO intake, Supplement acceptance, Weight trends, Labs, I & O's  ASSESSMENT:   83 yo female with hx of CKD, CAD who came with cc of dark stool and chest pain. She said she was in her usual state of health until yesterday when she had chest pain across her chest, worsen with breathing, non radiating, moderate, w/o dyspnea/cough/fever/chills along with constipation. She took a stool softener and went to bed but later had two dark BMs which got her worried that she may have had bloody stool which she never had before. She had mild abdominal discomfort w/o nausea/vomiting/diarrhea.  8/28 Pt has been skipping meals or eating small portions/quantities since previous assessment. She continues to have decreased appetite and has also been experiencing ongoing nausea which has required doses of PRN IV Zofran. She has been consuming Boost Plus most of the times that it is offered. No new weight since admission; will order new weight to be obtained today for comparison.   Per Dr. Elmyra Ricks note yesterday AM: per cardiology no indication for catheterization at this time, risk outweighs benefit. 6mm lung nodule in the RLL on CT chest 8/26; will need outpatient follow up.   Medications reviewed; 1 mg oral folic acid/day, 1 tablet  Slow-Mag/day, daily multivitamin with minerals, PRN IV Zofran, 1 packet Miralax/day. Labs reviewed; Na: 120 mmol/L, Cl: 92 mmol/L, BUN: 37 mg/dL, creatinine: 4.09 mg/dL, Ca: 8.5 mg/dL, GFR: 14 mL/min.   IVF: NS @ 75 mL/hr.    8/23 - Diet advanced from NPO to Heart Healthy today at 0922.  - Visualized lunch tray with a few small bites of pancake and ~50% of a bowl of soup consumed.  - She was not feeling very hungry.  - She requested coffee with 2 packets of sugar and 2 creamers; provided this for pt.  - She has had a decreased appetite for >/=1 year as she has gotten older. - She often makes items such as chicken noodle and vegetable soup and drinks 2 bottles of chocolate Boost/day. - Provided pt with Boost coupons for use after d/c. - She was at a funeral on 8/21 and did not eat anything that day and that lunch today is the first thing she can recall having since Monday (8/20) night.  - She denies abdominal pain or nausea. - Physical assessment shows moderate and severe muscle wasting and moderate and severe fat wasting.  - Pt reports she has been losing weight over the past few months but is unsure of amount of weight lost.  - Last weight in chart prior to this admission is from 01/15/15 when she weighed 130 lbs. - This indicates 13 lb weight loss (10% body weight) in the past 20 months which is not significant for time frame.       Diet Order:  Diet regular Room service appropriate?  Yes; Fluid consistency: Thin  Skin:  Reviewed, no issues  Last BM:  8/23  Height:   Ht Readings from Last 1 Encounters:  09/15/16 5\' 4"  (1.626 m)    Weight:   Wt Readings from Last 1 Encounters:  09/15/16 117 lb 4.6 oz (53.2 kg)    Ideal Body Weight:  54.54 kg  BMI:  Body mass index is 20.13 kg/m.  Estimated Nutritional Needs:   Kcal:  1225-1435 (23-27 kcal/kg)  Protein:  53-63 grams (1-1.2 grams/kg)  Fluid:  1.2-1.4 L/day  EDUCATION NEEDS:   No education needs identified at this  time    Trenton Gammon, MS, RD, LDN, CNSC Inpatient Clinical Dietitian Pager # [phone] After hours/weekend pager # [phone]

## 2016-09-22 LAB — CULTURE, RESPIRATORY W GRAM STAIN

## 2016-09-22 LAB — BASIC METABOLIC PANEL
Anion gap: 10 (ref 5–15)
Anion gap: 8 (ref 5–15)
Anion gap: 10 (ref 5–15)
BUN: 41 mg/dL — ABNORMAL HIGH (ref 6–20)
BUN: 44 mg/dL — ABNORMAL HIGH (ref 6–20)
BUN: 46 mg/dL — ABNORMAL HIGH (ref 6–20)
CO2: 18 mmol/L — ABNORMAL LOW (ref 22–32)
CO2: 20 mmol/L — ABNORMAL LOW (ref 22–32)
CO2: 21 mmol/L — ABNORMAL LOW (ref 22–32)
Calcium: 8.3 mg/dL — ABNORMAL LOW (ref 8.9–10.3)
Calcium: 8.5 mg/dL — ABNORMAL LOW (ref 8.9–10.3)
Calcium: 8.7 mg/dL — ABNORMAL LOW (ref 8.9–10.3)
Chloride: 90 mmol/L — ABNORMAL LOW (ref 101–111)
Chloride: 90 mmol/L — ABNORMAL LOW (ref 101–111)
Chloride: 89 mmol/L — ABNORMAL LOW (ref 101–111)
Creatinine, Ser: 3.66 mg/dL — ABNORMAL HIGH (ref 0.44–1.00)
Creatinine, Ser: 3.89 mg/dL — ABNORMAL HIGH (ref 0.44–1.00)
Creatinine, Ser: 4.15 mg/dL — ABNORMAL HIGH (ref 0.44–1.00)
GFR calc Af Amer: 12 mL/min — ABNORMAL LOW (ref >60)
GFR calc Af Amer: 11 mL/min — ABNORMAL LOW (ref >60)
GFR calc Af Amer: 10 mL/min — ABNORMAL LOW (ref >60)
GFR calc non Af Amer: 10 mL/min — ABNORMAL LOW (ref >60)
GFR calc non Af Amer: 9 mL/min — ABNORMAL LOW (ref >60)
GFR calc non Af Amer: 9 mL/min — ABNORMAL LOW (ref >60)
Glucose, Bld: 100 mg/dL — ABNORMAL HIGH (ref 65–99)
Glucose, Bld: 93 mg/dL (ref 65–99)
Glucose, Bld: 88 mg/dL (ref 65–99)
Potassium: 5.4 mmol/L — ABNORMAL HIGH (ref 3.5–5.1)
Potassium: 5.3 mmol/L — ABNORMAL HIGH (ref 3.5–5.1)
Potassium: 5.9 mmol/L — ABNORMAL HIGH (ref 3.5–5.1)
Sodium: 118 mmol/L — CL (ref 135–145)
Sodium: 118 mmol/L — CL (ref 135–145)
Sodium: 120 mmol/L — ABNORMAL LOW (ref 135–145)

## 2016-09-22 LAB — CORTISOL: Cortisol, Plasma: 28.7 ug/dL

## 2016-09-22 LAB — CULTURE, RESPIRATORY
CULTURE: NORMAL
SPECIAL REQUESTS: NORMAL

## 2016-09-22 MED ORDER — SODIUM POLYSTYRENE SULFONATE 15 GM/60ML PO SUSP
30.0000 g | Freq: Once | ORAL | Status: AC
  Administered 2016-09-22: 30 g via ORAL
  Filled 2016-09-22: qty 120

## 2016-09-22 NOTE — Progress Notes (Signed)
Patient ID: Dannielle Karvonen, female   DOB: 24-Dec-1922, 83 y.o.   MRN: [id number]  PROGRESS NOTE    SHAWNNA PANCAKE  [id number]:[id number] DOB: April 01, 1922 DOA: 09/15/2016  PCP: [name], MD   Brief Narrative:  83 year old female with Takotsubo cardiomyopathy, dyslipidemia, CKD, tobacco abuse, CAD who presented to ED with chest pain and elevated troponin level. Cardio consulted for NSTEMI.   Assessment & Plan:   Active Problems:   NSTEMI (non-ST elevated myocardial infarction) (HCC) - Seen by cardio in consultation - ECHO 8/23 - EF 55%, apical inferior, anterior and septal hypokinesis, grade 1 DD - Lexiscan 8/25 - There is a moderate sized defect centered in the apex extending into the low anterior wall. While there is a fixed component consistent with infarct, there is a small to moderate region demonstrating reversibility as well consistent with associated ischemia. Poor thickening in the region of the myocardial defect centered at the apex. Left ventricular ejection fraction 56% - No further work up required at this time - Outpt follow up - Continue daily aspirin  - Obtain palliative for goals of care   Acute combined systolic and diastolic heart failure (HCC) / Takotsubo CM - Per cardio, outpt follow up, signed off 8/27 - Appt sch for 10/06/2016 at 9 am with Azalee Course PA  Protein-calorie malnutrition, severe - In the context of chronic illness - Diet as tolerated   Elevated D dimer - Low suspicion for PE - V/Q low risk  Lobar pneumonia, unspecified organism  - Azithro stopped - Continue cefepime   CKD stage 5 / Hyponatremia  - Cr continues to trend up and sodium 120 - Follow up daily BMP  Hyperkalemia - Likely from NSTEMI - Give kayexalate - Follow up BMP in am  Anemia of chronic disease - Due to CKD - Follow up CBC daily   Dyslipidemia - Continue Crestor   DVT prophylaxis: Heparin subQ Code Status: DNR/DNI Family Communication: no family at the  bedside Disposition Plan: will get PCT for GOC, not yet stable for D/C   Consultants:   Cardio  Palliative care   Procedures:   ECHO 8/23 - EF 55%, apical inferior, anterior and septal hypokinesis, grade 1 DD  Lexiscan 8/25 - 1. There is a moderate sized defect centered in the apex extending into the low anterior wall. While there is a fixed component consistent with infarct, there is a small to moderate region demonstrating reversibility as well consistent with associated ischemia. Poor thickening in the region of the myocardial defect centered at the apex. Left ventricular ejection fraction 56%  Antimicrobials:   Azithromycin  Cefepime 8/26 -->   Subjective: No overnight events.   Objective: Vitals:   09/21/16 1639 09/21/16 2050 09/22/16 0517 09/22/16 1345  BP:  (!) 114/43 (!) 102/46 (!) 130/47  Pulse:  100 82 88  Resp:  18 20 17   Temp:  99.4 F (37.4 C) 98.5 F (36.9 C) 98.1 F (36.7 C)  TempSrc:  Oral Oral Oral  SpO2: 96% 96% 94% 92%  Weight:      Height:        Intake/Output Summary (Last 24 hours) at 09/22/16 1745 Last data filed at 09/22/16 1600  Gross per 24 hour  Intake              790 ml  Output              225 ml  Net  565 ml   Filed Weights   09/15/16 1634 09/15/16 2146 09/21/16 1156  Weight: 54.9 kg (121 lb) 53.2 kg (117 lb 4.6 oz) 57.8 kg (127 lb 6.8 oz)    Examination:  General exam: Appears calm and comfortable  Respiratory system: Clear to auscultation. Respiratory effort normal. Cardiovascular system: S1 & S2 heard, RRR.  Gastrointestinal system: Abdomen is nondistended, soft and nontender. No organomegaly or masses felt. Normal bowel sounds heard. Central nervous system: No focal neurological deficits. Extremities: Symmetric 5 x 5 power. Skin: No rashes, lesions or ulcers Psychiatry: Judgement and insight appear normal. Mood & affect appropriate.   Data Reviewed: I have personally reviewed following labs and imaging  studies  CBC:  Recent Labs Lab 09/15/16 1809 09/16/16 0129 09/20/16 0451  WBC 7.5 7.0 10.5  NEUTROABS 6.0  --   --   HGB 10.6* 10.2* 9.2*  HCT 32.1* 30.1* 27.7*  MCV 87.7 87.0 84.2  PLT 254 245 247   Basic Metabolic Panel:  Recent Labs Lab 09/21/16 1117 09/21/16 2039 09/22/16 0238 09/22/16 0727 09/22/16 1212  NA 119* 117* 118* 118* 120*  K 4.8 4.9 5.4* 5.3* 5.9*  CL 91* 89* 90* 90* 89*  CO2 19* 18* 18* 20* 21*  GLUCOSE 116* 115* 100* 93 88  BUN 37* 39* 41* 44* 46*  CREATININE 3.20* 3.59* 3.66* 3.89* 4.15*  CALCIUM 8.6* 8.3* 8.3* 8.5* 8.7*   GFR: Estimated Creatinine Clearance: 7.6 mL/min (A) (by C-G formula based on SCr of 4.15 mg/dL (H)). Liver Function Tests:  Recent Labs Lab 09/15/16 1809 09/20/16 0451  AST 28 29  ALT 10* 13*  ALKPHOS 60 71  BILITOT 0.9 0.6  PROT 6.9 6.5  ALBUMIN 3.2* 2.6*   No results for input(s): LIPASE, AMYLASE in the last 168 hours. No results for input(s): AMMONIA in the last 168 hours. Coagulation Profile: No results for input(s): INR, PROTIME in the last 168 hours. Cardiac Enzymes:  Recent Labs Lab 09/15/16 1809 09/16/16 0129 09/16/16 1211 09/17/16 1825  TROPONINI 0.89* 0.70* 0.38* 0.13*   BNP (last 3 results) No results for input(s): PROBNP in the last 8760 hours. HbA1C: No results for input(s): HGBA1C in the last 72 hours. CBG: No results for input(s): GLUCAP in the last 168 hours. Lipid Profile: No results for input(s): CHOL, HDL, LDLCALC, TRIG, CHOLHDL, LDLDIRECT in the last 72 hours. Thyroid Function Tests: Recent Labs  09/21/16 2304  TSH 3.162   Anemia Panel: No results for input(s): VITAMINB12, FOLATE, FERRITIN, TIBC, IRON, RETICCTPCT in the last 72 hours.   Sepsis Labs: @LABRCNTIP (procalcitonin:4,lacticidven:4)   Recent Results (from the past 240 hour(s))  MRSA PCR Screening     Status: None   Collection Time: 09/19/16  3:37 PM  Result Value Ref Range Status   MRSA by PCR NEGATIVE NEGATIVE  Final  Culture, respiratory (NON-Expectorated)     Status: None   Collection Time: 09/19/16  3:54 PM  Result Value Ref Range Status   Specimen Description SPUTUM  Final   Special Requests Normal Reflexed from [id number]  Final   Gram Stain   Final   Culture   Final    Consistent with normal respiratory flora.    Report Status 09/22/2016 FINAL  Final      Radiology Studies: Ct Chest Wo Contrast  Result Date: 09/19/2016 CLINICAL DATA:  Chest pain EXAM: CT CHEST WITHOUT CONTRAST TECHNIQUE: Multidetector CT imaging of the chest was performed following the standard protocol without IV contrast. COMPARISON:  Chest x-ray 09/15/2016  FINDINGS: Cardiovascular: Heart size upper normal. No pericardial effusion. No thoracic aortic aneurysm. Mediastinum/Nodes: Scattered small mediastinal lymph nodes. Calcified lymph nodes are seen in the right hilum. Abnormal soft tissue in the left hilum likely related to the associated left lung airspace disease. The esophagus has normal imaging features. There is no axillary lymphadenopathy. Lungs/Pleura: Hyperexpansion is consistent with emphysema. There is diffuse interstitial and airspace disease in the the left upper and lower lobe with patchy central ground-glass attenuation right middle and lower lobes. 6 mm ill-defined nodule posterior right lower lobe has other associated smaller ground-glass nodules. Upper Abdomen: Unremarkable. Musculoskeletal: Bone windows reveal no worrisome lytic or sclerotic osseous lesions. IMPRESSION: 1. Diffuse interstitial and airspace disease in the left lung, most suggestive of pneumonia. Disease is substantially progressed since the chest x-ray of 09/15/2016. Electronically Signed   By: Kennith CenterEric  Mansell M.D.   On: 09/19/2016 14:23   Dg Chest Port 1 View  Result Date: 09/21/2016 CLINICAL DATA:  Wheezing, followup EXAM: PORTABLE CHEST 1 VIEW COMPARISON:  Chest x-ray of 09/19/2016 and chest x-ray of 09/15/2016 FINDINGS: There has been some  minimal improvement in the extensive airspace disease throughout much of the left lung. Some opacities remain peripherally in the left mid lung and at the left lung base. There do appear to be small pleural effusions present with bibasilar linear atelectasis noted. Cardiomegaly is stable. No acute bony abnormality is seen. IMPRESSION: 1. Some interval improvement in left lung airspace disease when compared to the prior CT. 2. Persistent opacities in the left mid lung and left lung base consistent with residual pneumonia. 3. Small pleural effusions Electronically Signed   By: Dwyane DeePaul  Barry M.D.   On: 09/21/2016 11:14     Scheduled Meds: . aspirin  81 mg Oral Daily  . ceFEPime   500 mg Intravenous Q24H  . folic acid  1 mg Oral Daily  . heparin  5,000 Units Subcutaneous Q8H  . magnesium chloride  1 tablet Oral Daily  . multivitamin   1 tablet Oral Daily  . rosuvastatin  40 mg Oral q1800   Continuous Infusions: . azithromycin Stopped (09/22/16 1323)     LOS: 7 days    Time spent: 25 minutes  Greater than 50% of the time spent on counseling and coordinating the care.   Manson PasseyAlma Vala Raffo, MD [facility] Pager [phone]  If 7PM-7AM, please contact night-coverage [email or web address] Password [email or web address] 09/22/2016, 5:45 PM

## 2016-09-22 NOTE — Progress Notes (Signed)
Pharmacy Antibiotic Note  Alicia [name] is a 83 y.o. female admitted on 09/15/2016 with pneumonia.  Pharmacy has been consulted for cefepime dosing.   Plan: continue cefepime 500mg  IV q24 for CrCl <7310mls/min F/u needs for abx with worsening Scr, normal WBC and afebrile   Height: 5\' 4"  (162.6 cm) Weight: 127 lb 6.8 oz (57.8 kg) IBW/kg (Calculated) : 54.7  Temp (24hrs), Avg:98.7 F (37.1 C), Min:98.2 F (36.8 C), Max:99.4 F (37.4 C)   Recent Labs Lab 09/15/16 1809 09/16/16 0129  09/20/16 0451  09/21/16 0730 09/21/16 1117 09/21/16 2039 09/22/16 0238 09/22/16 0727  WBC 7.5 7.0  --  10.5  --   --   --   --   --   --   CREATININE 1.50* 1.34*  < > 2.12*  < > 3.17* 3.20* 3.59* 3.66* 3.89*  < > = values in this interval not displayed.  Estimated Creatinine Clearance: 8.1 mL/min (A) (by C-G formula based on SCr of 3.89 mg/dL (H)).    Allergies  Allergen Reactions  . Tylenol 8 Hour [Acetaminophen] Other (See Comments)    GI Upset  . Aspirin Nausea And Vomiting    GI Upset  . Penicillins Rash     Thank you for allowing pharmacy to be a part of this patient's care.  Arley Phenixllen Dany Harten RPh 09/22/2016, 10:23 AM Pager [phone]

## 2016-09-22 NOTE — Clinical Social Work Placement (Signed)
   CLINICAL SOCIAL WORK PLACEMENT  NOTE  Date:  09/22/2016  Patient Details  Name: Alicia [name] MRN: [id number] Date of Birth: [birth year]  Clinical Social Work is seeking post-discharge placement for this patient at the Skilled  Nursing Facility level of care (*CSW will initial, date and re-position this form in  chart as items are completed):  Yes   Patient/family provided with [facility] Clinical Social Work Department's list of facilities offering this level of care within the geographic area requested by the patient (or if unable, by the patient's family).  Yes   Patient/family informed of their freedom to choose among providers that offer the needed level of care, that participate in Medicare, Medicaid or managed care program needed by the patient, have an available bed and are willing to accept the patient.  Yes   Patient/family informed of [facility]'s ownership interest in [facility] and [facility], as well as of the fact that they are under no obligation to receive care at these facilities.  PASRR submitted to EDS on       PASRR number received on       Existing PASRR number confirmed on 09/21/16     FL2 transmitted to all facilities in geographic area requested by pt/family on 09/21/16     FL2 transmitted to all facilities within larger geographic area on       Patient informed that his/her managed care company has contracts with or will negotiate with certain facilities, including the following:        Yes   Patient/family informed of bed offers received.  Patient chooses bed at [facility]     Physician recommends and patient chooses bed at      Patient to be transferred to   on  .  Patient to be transferred to facility by       Patient family notified on   of transfer.  Name of family member notified:        PHYSICIAN       Additional Comment:    _______________________________________________ Alicia PolesKimberly L Ijanae Macapagal, LCSW 09/22/2016, 3:11  PM

## 2016-09-22 NOTE — Progress Notes (Signed)
PT Cancellation Note  Patient Details Name: Dannielle Karvonenlberta S Wan MRN: [id number] DOB: 1922-10-21   Cancelled Treatment:    Reason Eval/Treat Not Completed: Medical issues which prohibited therapy (critically low sodium level. Will follow. )   Tamala SerUhlenberg, Janiel Crisostomo Kistler 09/22/2016, 1:28 PM [phone]

## 2016-09-22 NOTE — Care Management Note (Signed)
Case Management Note  Patient Details  Name: Alicia [name] MRN: [id number] Date of Birth: October 16, 1922  Subjective/Objective:  83 y/o f admitted w/PNA,hyponatremia. Hx: DNR. From home. PT-recc SNF/24hr supv. CSW following for SNF.                  Action/Plan:d/c plan SNF.   Expected Discharge Date:                  Expected Discharge Plan:  Skilled Nursing Facility  In-House Referral:  Clinical Social Work  Discharge planning Services  CM Consult  Post Acute Care Choice:    Choice offered to:     DME Arranged:    DME Agency:     HH Arranged:    HH Agency:     Status of Service:  In process, will continue to follow  If discussed at Long Length of Stay Meetings, dates discussed:    Additional Comments:  Lanier ClamMahabir, Damita Eppard, RN 09/22/2016, 11:46 AM

## 2016-09-22 NOTE — Progress Notes (Signed)
    Hospital follow-up appointment scheduled for 10/06/2016 at 9:00 AM with Azalee CourseHao Meng[facility]-C [facility].   Marlon Peliffany Chrissi Crow, PA-C 09/22/16 8:47 AM

## 2016-09-23 DIAGNOSIS — Z515 Encounter for palliative care: Secondary | ICD-10-CM

## 2016-09-23 DIAGNOSIS — N185 Chronic kidney disease, stage 5: Secondary | ICD-10-CM

## 2016-09-23 NOTE — Progress Notes (Signed)
Patient refused morning lab draw, provider on call was notified.

## 2016-09-23 NOTE — Care Management Important Message (Signed)
Important Message  Patient Details  Name: Alicia [name] MRN: [id number] Date of Birth: October 02, 1922   Medicare Important Message Given:  Yes    Caren MacadamFuller, Tarea Skillman 09/23/2016, 10:17 AMImportant Message  Patient Details  Name: Alicia [name] MRN: [id number] Date of Birth: October 02, 1922   Medicare Important Message Given:  Yes    Caren MacadamFuller, Anastyn Ayars 09/23/2016, 10:17 AM
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
## 2016-09-23 NOTE — Progress Notes (Signed)
Physical Therapy Treatment Patient Details Name: Alicia [name] MRN: [id number] DOB: 04/21/1922 Today's Date: 09/23/2016    History of Present Illness 83 y.o. female with a history of Takotsubo cardiomyopathy, hyperlipidemia, CKD, cervical spinal stenosis, tobacco abuse, CAD and admitted for Takotsubo MI, also with CAP    PT Comments    Pt agreed to participate with PT. She was distracted by her purse before and during session. Cues for safety throughout session. LOB x 2 requiring external assistance from therapist to prevent fall. Continue to recommend SNF.     Follow Up Recommendations  SNF     Equipment Recommendations  Rolling walker with 5" wheels    Recommendations for Other Services       Precautions / Restrictions Precautions Precautions: Fall Restrictions Weight Bearing Restrictions: No    Mobility  Bed Mobility               General bed mobility comments: oob in recliner  Transfers Overall transfer level: Needs assistance Equipment used: Rolling walker (2 wheeled) Transfers: Sit to/from Stand Sit to Stand: Min assist         General transfer comment: Multiple attempts for pt to rise from chair unassisted-she was unable. Assist given by therapist to rise, stabilize, control descent. VCs safety, hand placement.   Ambulation/Gait Ambulation/Gait assistance: Min assist Ambulation Distance (Feet): 125 Feet Assistive device: Rolling walker (2 wheeled) Gait Pattern/deviations: Trunk flexed;Decreased stride length;Step-through pattern     General Gait Details: LOB x 2 during ambulation. Poor attention to task at times. Slow gait speed. Pt fatigues fairly easily   Stairs            Wheelchair Mobility    Modified Rankin (Stroke Patients Only)       Balance                                            Cognition Arousal/Alertness: Awake/alert Behavior During Therapy: WFL for tasks assessed/performed Overall Cognitive  Status: Within Functional Limits for tasks assessed                                 General Comments: pt perseverating with purse      Exercises      General Comments        Pertinent Vitals/Pain Pain Assessment: No/denies pain    Home Living                      Prior Function            PT Goals (current goals can now be found in the care plan section) Progress towards PT goals: Progressing toward goals    Frequency    Min 3X/week      PT Plan Current plan remains appropriate    Co-evaluation              AM-PAC PT "6 Clicks" Daily Activity  Outcome Measure  Difficulty turning over in bed (including adjusting bedclothes, sheets and blankets)?: A Lot Difficulty moving from lying on back to sitting on the side of the bed? : A Lot Difficulty sitting down on and standing up from a chair with arms (e.g., wheelchair, bedside commode, etc,.)?: Unable Help needed moving to and from a bed to chair (including a wheelchair)?: A Lot Help  needed walking in hospital room?: A Lot Help needed climbing 3-5 steps with a railing? : A Lot 6 Click Score: 11    End of Session Equipment Utilized During Treatment: Gait belt Activity Tolerance: Patient limited by fatigue Patient left: in chair;with call bell/phone within reach;with chair alarm set   PT Visit Diagnosis: Muscle weakness (generalized) (M62.81);Difficulty in walking, not elsewhere classified (R26.2)     Time: [id number] PT Time Calculation (min) (ACUTE ONLY): 17 min  Charges:  $Gait Training: 8-22 mins                    G Codes:         Rebeca AlertJannie Akin Yi, MPT Pager: [phone]
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
## 2016-09-23 NOTE — Progress Notes (Signed)
Patient ID: Alicia [name], female   DOB: 07-12-1922, 83 y.o.   MRN: [id number]  PROGRESS NOTE    CODI FOLKERTS  WUJ:[id number] DOB: Mar 03, 1922 DOA: 09/15/2016  PCP: [name], MD   Brief Narrative:  83 year old female with Takotsubo cardiomyopathy, dyslipidemia, CKD, tobacco abuse, CAD who presented to ED with chest pain and elevated troponin level. Cardio consulted for NSTEMI.   Assessment & Plan:   Active Problems:   NSTEMI (non-ST elevated myocardial infarction) (HCC) - ECHO 8/23 - EF 55%, apical inferior, anterior and septal hypokinesis, grade 1 DD - Lexiscan 8/25 - There is a moderate sized defect centered in the apex extending into the low anterior wall. While there is a fixed component consistent with infarct, there is a small to moderate region demonstrating reversibility as well consistent with associated ischemia. Poor thickening in the region of the myocardial defect centered at the apex. Left ventricular ejection fraction 56% - No further work up required at this time - Continue daily aspirin - Consulted palliative for GOC  Acute combined systolic and diastolic heart failure (HCC) / Takotsubo CM - Per cardio, outpt follow up, signed off 8/27 - Appt sch for 10/06/2016 at 9 am with Azalee Course PA  Protein-calorie malnutrition, severe - In the context of chronic illness - Diet as tolerated   Elevated D dimer - V/Q low risk  Lobar pneumonia, unspecified organism  - Azithro stopped - Continue cefepime   CKD stage 5 / Hyponatremia  - Pt refused blood draw this am - Follow up labs in am if able to obtain   Hyperkalemia - Likely from NSTEMI - Given kayexalate 8/29 - Refused blood work this am  Anemia of chronic disease - Due to CKD - Refused blood draw this am  Dyslipidemia - Continue crestor    DVT prophylaxis: Hep subQ Code Status: DNR/DNI Family Communication: no family at the bedside Disposition Plan: awaiting PCT recommendations     Consultants:   Cardio  Palliative care   Procedures:   ECHO 8/23 - EF 55%, apical inferior, anterior and septal hypokinesis, grade 1 DD  Lexiscan 8/25 - 1. There is a moderate sized defect centered in the apex extending into the low anterior wall. While there is a fixed component consistent with infarct, there is a small to moderate region demonstrating reversibility as well consistent with associated ischemia. Poor thickening in the region of the myocardial defect centered at the apex. Left ventricular ejection fraction 56%  Antimicrobials:   Azithromycin  Cefepime 8/26 -->   Subjective: No overnight events.  Objective: Vitals:   09/22/16 0517 09/22/16 1345 09/22/16 2033 09/23/16 0529  BP: (!) 102/46 (!) 130/47 (!) 129/47 (!) 133/55  Pulse: 82 88 93 93  Resp: 20 17 17 18   Temp: 98.5 F (36.9 C) 98.1 F (36.7 C) 97.7 F (36.5 C) (!) 97.3 F (36.3 C)  TempSrc: Oral Oral Oral Oral  SpO2: 94% 92% 94% 93%  Weight:      Height:        Intake/Output Summary (Last 24 hours) at 09/23/16 1138 Last data filed at 09/22/16 2000  Gross per 24 hour  Intake              940 ml  Output              275 ml  Net              665 ml   Filed Weights   09/15/16 1634  09/15/16 2146 09/21/16 1156  Weight: 54.9 kg (121 lb) 53.2 kg (117 lb 4.6 oz) 57.8 kg (127 lb 6.8 oz)    Physical Exam  Constitutional: Appears weak. No distress.  CVS: RRR, S1/S2 + Pulmonary: diminished breath sounds, no wheezing   Abdominal: Soft. BS +,  no distension, tenderness, rebound or guarding.  Musculoskeletal: Normal range of motion. No edema and no tenderness.  Neuro: Alert. No focal deficits  Skin: Skin is warm and dry. Psychiatric: Normal mood and affect.    Data Reviewed: I have personally reviewed following labs and imaging studies  CBC:  Recent Labs Lab 09/20/16 0451  WBC 10.5  HGB 9.2*  HCT 27.7*  MCV 84.2  PLT 247   Basic Metabolic Panel:  Recent Labs Lab 09/21/16 1117  09/21/16 2039 09/22/16 0238 09/22/16 0727 09/22/16 1212  NA 119* 117* 118* 118* 120*  K 4.8 4.9 5.4* 5.3* 5.9*  CL 91* 89* 90* 90* 89*  CO2 19* 18* 18* 20* 21*  GLUCOSE 116* 115* 100* 93 88  BUN 37* 39* 41* 44* 46*  CREATININE 3.20* 3.59* 3.66* 3.89* 4.15*  CALCIUM 8.6* 8.3* 8.3* 8.5* 8.7*   GFR: Estimated Creatinine Clearance: 7.6 mL/min (A) (by C-G formula based on SCr of 4.15 mg/dL (H)). Liver Function Tests:  Recent Labs Lab 09/20/16 0451  AST 29  ALT 13*  ALKPHOS 71  BILITOT 0.6  PROT 6.5  ALBUMIN 2.6*   No results for input(s): LIPASE, AMYLASE in the last 168 hours. No results for input(s): AMMONIA in the last 168 hours. Coagulation Profile: No results for input(s): INR, PROTIME in the last 168 hours. Cardiac Enzymes:  Recent Labs Lab 09/16/16 1211 09/17/16 1825  TROPONINI 0.38* 0.13*   BNP (last 3 results) No results for input(s): PROBNP in the last 8760 hours. HbA1C: No results for input(s): HGBA1C in the last 72 hours. CBG: No results for input(s): GLUCAP in the last 168 hours. Lipid Profile: No results for input(s): CHOL, HDL, LDLCALC, TRIG, CHOLHDL, LDLDIRECT in the last 72 hours. Thyroid Function Tests:  Recent Labs  09/21/16 2304  TSH 3.162   Anemia Panel: No results for input(s): VITAMINB12, FOLATE, FERRITIN, TIBC, IRON, RETICCTPCT in the last 72 hours.   Sepsis Labs: @LABRCNTIP (procalcitonin:4,lacticidven:4)   Recent Results (from the past 240 hour(s))  MRSA PCR Screening     Status: None   Collection Time: 09/19/16  3:37 PM  Result Value Ref Range Status   MRSA by PCR NEGATIVE NEGATIVE Final  Culture, respiratory (NON-Expectorated)     Status: None   Collection Time: 09/19/16  3:54 PM  Result Value Ref Range Status   Specimen Description SPUTUM  Final   Special Requests Normal Reflexed from [id number]  Final   Gram Stain   Final   Culture   Final    Consistent with normal respiratory flora.    Report Status 09/22/2016 FINAL   Final      Radiology Studies: Ct Chest Wo Contrast  Result Date: 09/19/2016 CLINICAL DATA:  Chest pain EXAM: CT CHEST WITHOUT CONTRAST TECHNIQUE: Multidetector CT imaging of the chest was performed following the standard protocol without IV contrast. COMPARISON:  Chest x-ray 09/15/2016 FINDINGS: Cardiovascular: Heart size upper normal. No pericardial effusion. No thoracic aortic aneurysm. Mediastinum/Nodes: Scattered small mediastinal lymph nodes. Calcified lymph nodes are seen in the right hilum. Abnormal soft tissue in the left hilum likely related to the associated left lung airspace disease. The esophagus has normal imaging features. There is no axillary lymphadenopathy.  Lungs/Pleura: Hyperexpansion is consistent with emphysema. There is diffuse interstitial and airspace disease in the the left upper and lower lobe with patchy central ground-glass attenuation right middle and lower lobes. 6 mm ill-defined nodule posterior right lower lobe has other associated smaller ground-glass nodules. Upper Abdomen: Unremarkable. Musculoskeletal: Bone windows reveal no worrisome lytic or sclerotic osseous lesions. IMPRESSION: 1. Diffuse interstitial and airspace disease in the left lung, most suggestive of pneumonia. Disease is substantially progressed since the chest x-ray of 09/15/2016. Electronically Signed   By: Kennith Center M.D.   On: 09/19/2016 14:23   Dg Chest Port 1 View  Result Date: 09/21/2016 CLINICAL DATA:  Wheezing, followup EXAM: PORTABLE CHEST 1 VIEW COMPARISON:  Chest x-ray of 09/19/2016 and chest x-ray of 09/15/2016 FINDINGS: There has been some minimal improvement in the extensive airspace disease throughout much of the left lung. Some opacities remain peripherally in the left mid lung and at the left lung base. There do appear to be small pleural effusions present with bibasilar linear atelectasis noted. Cardiomegaly is stable. No acute bony abnormality is seen. IMPRESSION: 1. Some interval  improvement in left lung airspace disease when compared to the prior CT. 2. Persistent opacities in the left mid lung and left lung base consistent with residual pneumonia. 3. Small pleural effusions Electronically Signed   By: Dwyane Dee M.D.   On: 09/21/2016 11:14     Scheduled Meds: . aspirin  81 mg Oral Daily  . ceFEPime   500 mg Intravenous Q24H  . folic acid  1 mg Oral Daily  . heparin  5,000 Units Subcutaneous Q8H  . magnesium chloride  1 tablet Oral Daily  . multivitamin   1 tablet Oral Daily  . rosuvastatin  40 mg Oral q1800   Continuous Infusions:    LOS: 8 days    Time spent: 25 minutes  Greater than 50% of the time spent on counseling and coordinating the care.   Manson Passey, MD [facility] Pager [phone]  If 7PM-7AM, please contact night-coverage [email or web address] Password [email or web address] 09/23/2016, 11:38 AM

## 2016-09-24 DIAGNOSIS — Z515 Encounter for palliative care: Secondary | ICD-10-CM

## 2016-09-24 DIAGNOSIS — Z7189 Other specified counseling: Secondary | ICD-10-CM

## 2016-09-24 LAB — BASIC METABOLIC PANEL
Anion gap: 13 (ref 5–15)
BUN: 57 mg/dL — AB (ref 6–20)
CHLORIDE: 89 mmol/L — AB (ref 101–111)
CO2: 17 mmol/L — AB (ref 22–32)
CREATININE: 4.9 mg/dL — AB (ref 0.44–1.00)
Calcium: 8.1 mg/dL — ABNORMAL LOW (ref 8.9–10.3)
GFR calc Af Amer: 8 mL/min — ABNORMAL LOW (ref >60)
GFR calc non Af Amer: 7 mL/min — ABNORMAL LOW (ref >60)
GLUCOSE: 67 mg/dL (ref 65–99)
Potassium: 4.4 mmol/L (ref 3.5–5.1)
Sodium: 119 mmol/L — CL (ref 135–145)

## 2016-09-24 LAB — CBC
HEMATOCRIT: 23.6 % — AB (ref 36.0–46.0)
HEMOGLOBIN: 8.2 g/dL — AB (ref 12.0–15.0)
MCH: 28 pg (ref 26.0–34.0)
MCHC: 34.7 g/dL (ref 30.0–36.0)
MCV: 80.5 fL (ref 78.0–100.0)
Platelets: 316 10*3/uL (ref 150–400)
RBC: 2.93 MIL/uL — AB (ref 3.87–5.11)
RDW: 14.5 % (ref 11.5–15.5)
WBC: 18.6 10*3/uL — ABNORMAL HIGH (ref 4.0–10.5)

## 2016-09-24 MED ORDER — HALOPERIDOL LACTATE 2 MG/ML PO CONC
0.5000 mg | ORAL | Status: DC | PRN
  Filled 2016-09-24: qty 0.3

## 2016-09-24 MED ORDER — GLYCOPYRROLATE 0.2 MG/ML IJ SOLN
0.2000 mg | INTRAMUSCULAR | Status: DC | PRN
  Filled 2016-09-24: qty 1

## 2016-09-24 MED ORDER — GLYCOPYRROLATE 1 MG PO TABS
1.0000 mg | ORAL_TABLET | ORAL | Status: DC | PRN
  Filled 2016-09-24: qty 1

## 2016-09-24 MED ORDER — LORAZEPAM 2 MG/ML IJ SOLN: 0.5000 mg | INTRAMUSCULAR | Status: DC | PRN

## 2016-09-24 MED ORDER — SODIUM CHLORIDE 0.9% FLUSH: 3.0000 mL | INTRAVENOUS | Status: DC | PRN

## 2016-09-24 MED ORDER — LORAZEPAM 2 MG/ML PO CONC: 0.5000 mg | ORAL | Status: DC | PRN

## 2016-09-24 MED ORDER — LORAZEPAM 0.5 MG PO TABS
0.5000 mg | ORAL_TABLET | ORAL | Status: DC | PRN
  Administered 2016-09-25: 0.5 mg via ORAL
  Filled 2016-09-24: qty 1

## 2016-09-24 MED ORDER — BIOTENE DRY MOUTH MT LIQD: 15.0000 mL | OROMUCOSAL | Status: DC | PRN

## 2016-09-24 MED ORDER — HALOPERIDOL 0.5 MG PO TABS
0.5000 mg | ORAL_TABLET | ORAL | Status: DC | PRN
  Filled 2016-09-24: qty 1

## 2016-09-24 MED ORDER — POLYVINYL ALCOHOL 1.4 % OP SOLN
1.0000 [drp] | Freq: Four times a day (QID) | OPHTHALMIC | Status: DC | PRN
  Filled 2016-09-24: qty 15

## 2016-09-24 MED ORDER — SODIUM CHLORIDE 0.9% FLUSH
3.0000 mL | Freq: Two times a day (BID) | INTRAVENOUS | Status: DC
  Administered 2016-09-24 (×2): 3 mL via INTRAVENOUS

## 2016-09-24 MED ORDER — GLYCOPYRROLATE 0.2 MG/ML IJ SOLN
0.2000 mg | INTRAMUSCULAR | Status: DC | PRN
Start: 2016-09-24 — End: 2016-09-25
  Filled 2016-09-24: qty 1

## 2016-09-24 MED ORDER — HALOPERIDOL LACTATE 5 MG/ML IJ SOLN: 0.5000 mg | INTRAMUSCULAR | Status: DC | PRN

## 2016-09-24 MED ORDER — SODIUM CHLORIDE 0.9 % IV SOLN: 250.0000 mL | INTRAVENOUS | Status: DC | PRN

## 2016-09-24 NOTE — Progress Notes (Signed)
Patient had 7 beat run of V tach this AM. Patient sleeping. Asymptomatic.

## 2016-09-24 NOTE — Progress Notes (Signed)
CRITICAL VALUE ALERT  Critical Value:  Na 119  Date & Time Notied:  09/24/16 0555  Provider Notified: Linton FlemingsX Blount  Orders Received/Actions taken:  No new orders given

## 2016-09-24 NOTE — Progress Notes (Signed)
Daily Progress Note   Patient Name: Alicia [name]       Date: 09/24/2016 DOB: October 29, 1922  Age: 83 y.o. MRN#: [id number] Attending Physician: Alison Murray, MD Primary Care Physician: [name], MD Admit Date: 09/15/2016  Reason for Consultation/Follow-up: Establishing goals of care  Subjective: Extensive conversation held with 7 family members.  Son was on the phone from [place].  Simmie Davies is the HCPOA. After discussion of CAD/MI/Kidney failure/not eating family agreed that patient is near end of life and requested Hospice House.   Family's questions and concerns addressed.   Assessment: Kidney failure worsening.  Sodium dropping precipitously as well. Patient with minimal to no PO intake for 4 days.     Patient Profile/HPI:  Pt is a 83 yo female with hx of CKD, CAD who came with cc of dark stool and chest pain. She said she was in her usual state of health until yesterday when she had chest pain across her chest, worsen with breathing, non radiating, moderate, w/o dyspnea/cough/fever/chills along with constipation. She took a stool softener and went to bed but later had two dark BMs which got her worried that she may have had bloody stool which she never had before. She had mild abdominal discomfort w/o nausea/vomiting/diarrhea. No other complaints.   Length of Stay: 9  Current Medications: Scheduled Meds:  . lactose free nutrition  237 mL Oral TID BM  . sodium chloride flush  3 mL Intravenous Q12H    Continuous Infusions: . sodium chloride      PRN Meds: sodium chloride, acetaminophen, albuterol, antiseptic oral rinse, glycopyrrolate **OR** glycopyrrolate **OR** glycopyrrolate, guaiFENesin-dextromethorphan, haloperidol **OR** haloperidol **OR** haloperidol lactate,  LORazepam **OR** LORazepam **OR** LORazepam, nitroGLYCERIN, ondansetron (ZOFRAN) IV, phenol, polyvinyl alcohol, sodium chloride flush, traMADol  Physical Exam        Cachectically thin frail elderly demented CV rrr Resp no distress Abdomen thin NT  Vital Signs: BP (!) 121/47 (BP Location: Left Arm)   Pulse 92   Temp 97.9 F (36.6 C) (Axillary)   Resp 18   Ht 5\' 4"  (1.626 m)   Wt 57.8 kg (127 lb 6.8 oz)   SpO2 94%   BMI 21.87 kg/m  SpO2: SpO2: 94 % O2 Device: O2 Device: Not Delivered O2 Flow Rate: O2 Flow Rate (L/min): 1.5 L/min  Intake/output  summary:  Intake/Output Summary (Last 24 hours) at 09/24/16 1500 Last data filed at 09/24/16 1331  Gross per 24 hour  Intake              200 ml  Output                0 ml  Net              200 ml   LBM: Last BM Date: 09/22/16 Baseline Weight: Weight: 54.9 kg (121 lb) Most recent weight: Weight: 57.8 kg (127 lb 6.8 oz)       Palliative Assessment/Data:      Patient Active Problem List   Diagnosis Date Noted  . CKD (chronic kidney disease) stage 5, GFR less than 15 ml/min (HCC)   . Palliative care encounter   . Community acquired pneumonia 09/19/2016  . Protein-calorie malnutrition, severe 09/17/2016  . Acute combined systolic and diastolic heart failure (HCC)   . Elevated d-dimer   . Coronary artery disease involving native coronary artery of native heart without angina pectoris 08/23/2014  . Essential hypertension 08/23/2014  . Hyperlipidemia 08/23/2014  . Takotsubo syndrome 08/17/2014  . Cervical spinal cord compression (HCC)   . CKD (chronic kidney disease)   . Spinal stenosis in cervical region 08/16/2014  . NSTEMI (non-ST elevated myocardial infarction) (HCC) 08/16/2014  . AKI (acute kidney injury) (HCC) 08/15/2014  . Right hand weakness 08/15/2014  . Anemia 08/15/2014  . Peripheral edema 04/21/2014  . Dyspnea 04/15/2014  . Upper airway cough syndrome 10/08/2013  . Postinflammatory pulmonary fibrosis (HCC)  10/08/2013    Palliative Care Plan    Recommendations/Plan:  CSW order placed to request Hospice house Interventions/medications not contributing to comfort were discontinued. Medications ordered PRN to increase comfort.  PMT will sign off.  Please call us back if needed. [phone]   Goals of Care and Additional Recommendations:  Limitations on Scope of Treatment: Full Comfort Care  Code Status:  DNR  Prognosis:   < 2 weeks advanced CAD with recent NSTEMI, Advanced kidney failure with daily worsening of creatinine and sodium, not eating or drinking.  Discharge Planning:  Hospice facility   Care plan was discussed with attending physician and family.  Thank you for allowing the Palliative Medicine Team to assist in the care of this patient.  Total time spent:  60 min.     Greater than 50%  of this time was spent counseling and coordinating care related to the above assessment and plan.  Norvel RichardsMarianne Asahd Can, PA-C Palliative Medicine  Please contact Palliative MedicineTeam phone at [phone] for questions and concerns between 7 am - 7 pm.   Please see AMION for individual provider pager numbers.

## 2016-09-24 NOTE — Progress Notes (Signed)
Patient ID: Alicia KarvonenAlberta S [name], female   DOB: [birth year], 83 y.o.   MRN: [id number]  PROGRESS NOTE    Alicia Karvonenlberta S [name]  WUJ:[id number] DOB: [birth year] DOA: 09/15/2016  PCP: Georgann HousekeeperHusain, Karrar, MD   Brief Narrative:  83 year old female with Takotsubo cardiomyopathy, dyslipidemia, CKD, tobacco abuse, CAD who presented to ED with chest pain and elevated troponin level. Cardio consulted for NSTEMI.   Assessment & Plan:   Active Problems:   NSTEMI (non-ST elevated myocardial infarction) (HCC) - ECHO 8/23 - EF 55%, apical inferior, anterior and septal hypokinesis, grade 1 DD - Lexiscan 8/25 - There is a moderate sized defect centered in the apex extending into the low anterior wall. While there is a fixed component consistent with infarct, there is a small to moderate region demonstrating reversibility as well consistent with associated ischemia. Poor thickening in the region of the myocardial defect centered at the apex. Left ventricular ejection fraction 56% - Palliative following for GOC - No further cardiac work up planned   Acute combined systolic and diastolic heart failure (HCC) / Takotsubo CM - Per cardio, outpt follow up, signed off 8/27 - Appt sch for 10/06/2016 at 9 am with Azalee CourseHao Meng PA  Protein-calorie malnutrition, severe - In the context of chronic illness - Diet as tolerated   Elevated D dimer - V/Q low risk  Lobar pneumonia, unspecified organism  - Stopped cefepime today - Monitor off of abx   CKD stage 5 / Hyponatremia  - Cr continues to trend up and sodium continues to trend down - Worrisome that kidneys are shutting down - Follow up BMP in am  Hyperkalemia - Likely from NSTEMI - Given kayexalate 8/29 - Potassium WNL  Anemia of chronic disease - Due to CKD - Hgb stable   Dyslipidemia - Continue Crestor     DVT prophylaxis: Hep subQ Code Status: DNR/DNI Family Communication: no family at the bedside Disposition Plan: SNF likely in next 24 hours;  will check blood work tomorrow to see further sodium trend and Creatinine    Consultants:   Cardio  PCT  Procedures:   ECHO 8/23 - EF 55%, apical inferior, anterior and septal hypokinesis, grade 1 DD  Lexiscan 8/25 - 1. There is a moderate sized defect centered in the apex extending into the low anterior wall. While there is a fixed component consistent with infarct, there is a small to moderate region demonstrating reversibility as well consistent with associated ischemia. Poor thickening in the region of the myocardial defect centered at the apex. Left ventricular ejection fraction 56%  Antimicrobials:   Azithromycin  Cefepime 8/26 --> 8/31   Subjective: No overnight events,  Objective: Vitals:   09/23/16 0529 09/23/16 1259 09/23/16 1957 09/24/16 0536  BP: (!) 133/55 123/77 (!) 130/55 (!) 121/47  Pulse: 93 97 93 92  Resp: 18 18 18 18   Temp: (!) 97.3 F (36.3 C) 99 F (37.2 C) 98.1 F (36.7 C) 97.9 F (36.6 C)  TempSrc: Oral Oral Oral Axillary  SpO2: 93% 99% 99% 94%  Weight:      Height:        Intake/Output Summary (Last 24 hours) at 09/24/16 1054 Last data filed at 09/24/16 0200  Gross per 24 hour  Intake               80 ml  Output                0 ml  Net  80 ml   Filed Weights   09/15/16 1634 09/15/16 2146 09/21/16 1156  Weight: 54.9 kg (121 lb) 53.2 kg (117 lb 4.6 oz) 57.8 kg (127 lb 6.8 oz)    Examination:  General exam: Appears calm and comfortable  Respiratory system: Coarse breath sounds, no wheezing  Cardiovascular system: S1 & S2 heard, rate controlled  Gastrointestinal system: Abdomen is nondistended, soft and nontender. No organomegaly or masses felt. Normal bowel sounds heard. Central nervous system: No focal neurological deficits. Extremities: Symmetric 5 x 5 power. Skin: No rashes, lesions or ulcers Psychiatry: Not agitated or restless   Data Reviewed: I have personally reviewed following labs and imaging  studies  CBC:  Recent Labs Lab 09/20/16 0451 09/24/16 0443  WBC 10.5 18.6*  HGB 9.2* 8.2*  HCT 27.7* 23.6*  MCV 84.2 80.5  PLT 247 316   Basic Metabolic Panel:  Recent Labs Lab 09/21/16 2039 09/22/16 0238 09/22/16 0727 09/22/16 1212 09/24/16 0443  NA 117* 118* 118* 120* 119*  K 4.9 5.4* 5.3* 5.9* 4.4  CL 89* 90* 90* 89* 89*  CO2 18* 18* 20* 21* 17*  GLUCOSE 115* 100* 93 88 67  BUN 39* 41* 44* 46* 57*  CREATININE 3.59* 3.66* 3.89* 4.15* 4.90*  CALCIUM 8.3* 8.3* 8.5* 8.7* 8.1*   GFR: Estimated Creatinine Clearance: 6.5 mL/min (A) (by C-G formula based on SCr of 4.9 mg/dL (H)). Liver Function Tests:  Recent Labs Lab 09/20/16 0451  AST 29  ALT 13*  ALKPHOS 71  BILITOT 0.6  PROT 6.5  ALBUMIN 2.6*   No results for input(s): LIPASE, AMYLASE in the last 168 hours. No results for input(s): AMMONIA in the last 168 hours. Coagulation Profile: No results for input(s): INR, PROTIME in the last 168 hours. Cardiac Enzymes:  Recent Labs Lab 09/17/16 1825  TROPONINI 0.13*   BNP (last 3 results) No results for input(s): PROBNP in the last 8760 hours. HbA1C: No results for input(s): HGBA1C in the last 72 hours. CBG: No results for input(s): GLUCAP in the last 168 hours. Lipid Profile: No results for input(s): CHOL, HDL, LDLCALC, TRIG, CHOLHDL, LDLDIRECT in the last 72 hours. Thyroid Function Tests:  Recent Labs  09/21/16 2304  TSH 3.162   Anemia Panel: No results for input(s): VITAMINB12, FOLATE, FERRITIN, TIBC, IRON, RETICCTPCT in the last 72 hours. Urine analysis:    Component Value Date/Time   COLORURINE YELLOW 08/15/2014 1800   APPEARANCEUR CLEAR 08/15/2014 1800   LABSPEC 1.004 (L) 08/15/2014 1800   PHURINE 6.0 08/15/2014 1800   GLUCOSEU NEGATIVE 08/15/2014 1800   HGBUR NEGATIVE 08/15/2014 1800   BILIRUBINUR NEGATIVE 08/15/2014 1800   KETONESUR NEGATIVE 08/15/2014 1800   PROTEINUR NEGATIVE 08/15/2014 1800   UROBILINOGEN 0.2 08/15/2014 1800    NITRITE NEGATIVE 08/15/2014 1800   LEUKOCYTESUR NEGATIVE 08/15/2014 1800   Sepsis Labs: @LABRCNTIP (procalcitonin:4,lacticidven:4)   ) Recent Results (from the past 240 hour(s))  MRSA PCR Screening     Status: None   Collection Time: 09/19/16  3:37 PM  Result Value Ref Range Status   MRSA by PCR NEGATIVE NEGATIVE Final    Comment:        The GeneXpert MRSA Assay (FDA approved for NASAL specimens only), is one component of a comprehensive MRSA colonization surveillance program. It is not intended to diagnose MRSA infection nor to guide or monitor treatment for MRSA infections.   Culture, expectorated sputum-assessment     Status: None   Collection Time: 09/19/16  3:54 PM  Result Value Ref  Range Status   Specimen Description SPUTUM  Final   Special Requests Normal  Final   Sputum evaluation THIS SPECIMEN IS ACCEPTABLE FOR SPUTUM CULTURE  Final   Report Status 09/19/2016 FINAL  Final  Culture, respiratory (NON-Expectorated)     Status: None   Collection Time: 09/19/16  3:54 PM  Result Value Ref Range Status   Specimen Description SPUTUM  Final   Special Requests Normal Reflexed from [id number]  Final   Gram Stain   Final    RARE WBC PRESENT, PREDOMINANTLY PMN RARE GRAM NEGATIVE RODS RARE GRAM POSITIVE COCCI IN PAIRS RARE YEAST RARE SQUAMOUS EPITHELIAL CELLS PRESENT    Culture   Final    Consistent with normal respiratory flora. Performed at [facility], [address] [place], [place] [address]    Report Status 09/22/2016 FINAL  Final      Radiology Studies: Dg Chest Port 1 View  Result Date: 09/21/2016 CLINICAL DATA:  Wheezing, followup EXAM: PORTABLE CHEST 1 VIEW COMPARISON:  Chest x-ray of 09/19/2016 and chest x-ray of 09/15/2016 FINDINGS: There has been some minimal improvement in the extensive airspace disease throughout much of the left lung. Some opacities remain peripherally in the left mid lung and at the left lung base. There do appear to be small  pleural effusions present with bibasilar linear atelectasis noted. Cardiomegaly is stable. No acute bony abnormality is seen. IMPRESSION: 1. Some interval improvement in left lung airspace disease when compared to the prior CT. 2. Persistent opacities in the left mid lung and left lung base consistent with residual pneumonia. 3. Small pleural effusions Electronically Signed   By: Dwyane Dee M.D.   On: 09/21/2016 11:14        Scheduled Meds: . aspirin  81 mg Oral Daily  . folic acid  1 mg Oral Daily  . heparin  5,000 Units Subcutaneous Q8H  . lactose free nutrition  237 mL Oral TID BM  . magnesium chloride  1 tablet Oral Daily  . multivitamin with minerals  1 tablet Oral Daily  . rosuvastatin  40 mg Oral q1800   Continuous Infusions:   LOS: 9 days    Time spent: 25 minutes  Greater than 50% of the time spent on counseling and coordinating the care.   Manson Passey, MD [facility] Pager [phone]  If 7PM-7AM, please contact night-coverage [email or web address] Password [facility] 09/24/2016, 10:54 AM

## 2016-09-25 MED ORDER — LORAZEPAM 0.5 MG PO TABS: 0.5000 mg | ORAL_TABLET | ORAL | 0 refills | Status: AC | PRN

## 2016-09-25 MED ORDER — ACETAMINOPHEN 325 MG PO TABS: 650.0000 mg | ORAL_TABLET | Freq: Four times a day (QID) | ORAL | 0 refills | Status: AC | PRN

## 2016-09-25 MED ORDER — HALOPERIDOL 0.5 MG PO TABS: 0.5000 mg | ORAL_TABLET | ORAL | 0 refills | Status: AC | PRN

## 2016-09-25 NOTE — Progress Notes (Addendum)
[facility] [phone], [facility] to transport

## 2016-09-25 NOTE — Discharge Summary (Signed)
Physician Discharge Summary  Alicia [name]:[id number] DOB: [birth year] DOA: 09/15/2016  PCP: [name], MD  Admit date: 09/15/2016 Discharge date: 09/25/2016  Recommendations for Outpatient Follow-up:  1. Hosp care as family prefers comfort care at this point   Discharge Diagnoses:  Active Problems:   NSTEMI (non-ST elevated myocardial infarction) (HCC)   Acute combined systolic and diastolic heart failure (HCC)   Elevated d-dimer   Protein-calorie malnutrition, severe   Community acquired pneumonia   CKD (chronic kidney disease) stage 5, GFR less than 15 ml/min (HCC)   Palliative care encounter   Goals of care, counseling/discussion   Comfort measures only status    Discharge Condition: stable   Diet recommendation: as tolerated   History of present illness:  83 year old female with Takotsubo cardiomyopathy, dyslipidemia, CKD, tobacco abuse, CAD who presented to ED with chest pain and elevated troponin level. Cardio consulted for NSTEMI.   Hospital Course:   Active Problems: NSTEMI (non-ST elevated myocardial infarction) (HCC) - ECHO 8/23 - EF 55%, apical inferior, anterior and septal hypokinesis, grade 1 DD - Lexiscan 8/25 - There is a moderate sized defect centered in the apex extending into the low anterior wall. While there is a fixed component consistent with infarct, there is a small to moderate region demonstrating reversibility as well consistent with associated ischemia. Poor thickening in the region of the myocardial defect centered at the apex. Left ventricular ejection fraction 56% - Palliative following for GOC - No further cardiac work up planned  - Focus on comfort care at this point   Acute combined systolic and diastolic heart failure (HCC) / Takotsubo CM - Per cardio, outpt follow up, signed off 8/27 - Appt sch for 10/06/2016 at 9 am with Azalee Course PA  Protein-calorie malnutrition, severe - In the context of chronic illness - Diet as  tolerated   Elevated D dimer - V/Q low risk  Lobar pneumonia, unspecified organism  - Stopped all abx  CKD stage 5 / Hyponatremia  - Cr continues to trend up and sodium continues to trend down - Focus on comfort care at this point   Hyperkalemia - Likely from NSTEMI - Given kayexalate 8/29 - Potassium WNL  Anemia of chronic disease - Due to CKD - Hgb stable   Dyslipidemia - Focus on comfort - Stopped statin therapy    DVT prophylaxis: Heparin subQ Code Status: DNR/DNI Family Communication: no family at the bedside     Consultants:   Cardio  PCT  Procedures:   ECHO 8/23 - EF 55%, apical inferior, anterior and septal hypokinesis, grade 1 DD  Lexiscan 8/25 - 1. There is a moderate sized defect centered in the apex extending into the low anterior wall. While there is a fixed component consistent with infarct, there is a small to moderate region demonstrating reversibility as well consistent with associated ischemia. Poor thickening in the region of the myocardial defect centered at the apex. Left ventricular ejection fraction 56%  Antimicrobials:   Azithromycin  Cefepime 8/26 --> 8/31  Signed:  Manson Passey, MD  [facility] 09/25/2016, 10:53 AM  Pager #: [phone]  Time spent in minutes: more than 30 minutes   Discharge Exam: Vitals:   09/24/16 2019 09/25/16 0413  BP: (!) 120/58 (!) 99/46  Pulse: 92 90  Resp: 18 20  Temp: 97.7 F (36.5 C) (!) 97.5 F (36.4 C)  SpO2: 95% 93%   Vitals:   09/24/16 0536 09/24/16 1540 09/24/16 2019 09/25/16 0413  BP: [name])  121/47 (!) 116/48 (!) 120/58 (!) 99/46  Pulse: 92 91 92 90  Resp: 18 18 18 20   Temp: 97.9 F (36.6 C) 98 F (36.7 C) 97.7 F (36.5 C) (!) 97.5 F (36.4 C)  TempSrc: Axillary Oral Oral Axillary  SpO2: 94% 94% 95% 93%  Weight:      Height:        General: Pt is alert, follows commands appropriately, not in acute distress Cardiovascular: Regular rate and rhythm, S1/S2  +, no murmurs Respiratory: Clear to auscultation bilaterally, no wheezing, no crackles, no rhonchi Abdominal: Soft, non tender, non distended, bowel sounds +, no guarding Extremities: no edema, no cyanosis, pulses palpable bilaterally DP and PT Neuro: Grossly nonfocal  Discharge Instructions  Discharge Instructions    Call MD for:  persistant nausea and vomiting    Complete by:  As directed    Call MD for:  severe uncontrolled pain    Complete by:  As directed    Diet - low sodium heart healthy    Complete by:  As directed    Increase activity slowly    Complete by:  As directed      Allergies as of 09/25/2016      Reactions   Tylenol 8 Hour [acetaminophen] Other (See Comments)   GI Upset   Aspirin Nausea And Vomiting   GI Upset   Penicillins Rash      Medication List    STOP taking these medications   famotidine 20 MG tablet Commonly known as:  PEPCID   FLAX SEED OIL PO   folic acid 1 MG tablet Commonly known as:  FOLVITE   Iron Tabs   LASIX 20 MG tablet Generic drug:  furosemide   magnesium chloride 64 MG Tbec SR tablet Commonly known as:  SLOW-MAG   multivitamin with minerals Tabs tablet   potassium chloride 10 MEQ tablet Commonly known as:  K-DUR   TH EYE DROP TEARS OP   VITAMIN B 12 PO   Vitamin D3 5000 units Caps     TAKE these medications   acetaminophen 325 MG tablet Commonly known as:  TYLENOL Take 2 tablets (650 mg total) by mouth every 6 (six) hours as needed for mild pain, fever or headache.   feeding supplement Liqd Take 1 Container by mouth 2 (two) times daily between meals.   haloperidol 0.5 MG tablet Commonly known as:  HALDOL Take 1 tablet (0.5 mg total) by mouth every 4 (four) hours as needed for agitation (or delirium).   LORazepam 0.5 MG tablet Commonly known as:  ATIVAN Take 1 tablet (0.5 mg total) by mouth every 4 (four) hours as needed for anxiety.   sodium chloride 5 % ophthalmic solution Commonly known as:  MURO  128 Place 1-2 drops into both eyes as needed for eye irritation.            Discharge Care Instructions        Start     Ordered   09/25/16 0000  acetaminophen (TYLENOL) 325 MG tablet  Every 6 hours PRN     09/25/16 1052   09/25/16 0000  haloperidol (HALDOL) 0.5 MG tablet  Every 4 hours PRN     09/25/16 1052   09/25/16 0000  LORazepam (ATIVAN) 0.5 MG tablet  Every 4 hours PRN     09/25/16 1052   09/25/16 0000  Increase activity slowly     09/25/16 1052   09/25/16 0000  Diet - low sodium heart healthy  09/25/16 1052   09/25/16 0000  Call MD for:  severe uncontrolled pain     09/25/16 1052   09/25/16 0000  Call MD for:  persistant nausea and vomiting     09/25/16 1052     Follow-up Information    [place], Peter M, MD Follow up on 10/06/2016.   Specialty:  Cardiology Why:  at 8:40 AM with PA, Azalee Course for Dr. [place]  Contact information: [address] [place] [address] [phone]            The results of significant diagnostics from this hospitalization (including imaging, microbiology, ancillary and laboratory) are listed below for reference.    Significant Diagnostic Studies: Dg Chest 2 View  Result Date: 09/15/2016 CLINICAL DATA:  Chest pain. EXAM: CHEST  2 VIEW COMPARISON:  Radiograph August 20, 2014. FINDINGS: Stable cardiomediastinal silhouette. Atherosclerosis of thoracic aorta is noted. No pneumothorax or pleural effusion is noted. Left midlung opacity is noted concerning for possible pneumonia or inflammation. Multilevel degenerative changes are noted in the thoracic spine. IMPRESSION: Focal left midlung opacity is noted concerning for pneumonia for atelectasis. Followup PA and lateral chest X-ray is recommended in 3-4 weeks following trial of antibiotic therapy to ensure resolution and exclude underlying malignancy. Electronically Signed   By: Lupita Raider, M.D.   On: 09/15/2016 19:30   Ct Chest Wo Contrast  Result Date:  09/19/2016 CLINICAL DATA:  Chest pain EXAM: CT CHEST WITHOUT CONTRAST TECHNIQUE: Multidetector CT imaging of the chest was performed following the standard protocol without IV contrast. COMPARISON:  Chest x-ray 09/15/2016 FINDINGS: Cardiovascular: Heart size upper normal. No pericardial effusion. No thoracic aortic aneurysm. Mediastinum/Nodes: Scattered small mediastinal lymph nodes. Calcified lymph nodes are seen in the right hilum. Abnormal soft tissue in the left hilum likely related to the associated left lung airspace disease. The esophagus has normal imaging features. There is no axillary lymphadenopathy. Lungs/Pleura: Hyperexpansion is consistent with emphysema. There is diffuse interstitial and airspace disease in the the left upper and lower lobe with patchy central ground-glass attenuation right middle and lower lobes. 6 mm ill-defined nodule posterior right lower lobe has other associated smaller ground-glass nodules. Upper Abdomen: Unremarkable. Musculoskeletal: Bone windows reveal no worrisome lytic or sclerotic osseous lesions. IMPRESSION: 1. Diffuse interstitial and airspace disease in the left lung, most suggestive of pneumonia. Disease is substantially progressed since the chest x-ray of 09/15/2016. Electronically Signed   By: Kennith Center M.D.   On: 09/19/2016 14:23   Nm Myocar Multi W/spect W/wall Motion / Ef  Result Date: 09/18/2016 CLINICAL DATA:  Chest pain. EXAM: MYOCARDIAL IMAGING WITH SPECT (REST AND PHARMACOLOGIC-STRESS) GATED LEFT VENTRICULAR WALL MOTION STUDY LEFT VENTRICULAR EJECTION FRACTION TECHNIQUE: Standard myocardial SPECT imaging was performed after resting intravenous injection of 10 mCi Tc-51m tetrofosmin. Subsequently, intravenous infusion of Lexiscan was performed under the supervision of the Cardiology staff. At peak effect of the drug, 30 mCi Tc-42m tetrofosmin was injected intravenously and standard myocardial SPECT imaging was performed. Quantitative gated imaging was  also performed to evaluate left ventricular wall motion, and estimate left ventricular ejection fraction. COMPARISON:  None. FINDINGS: Perfusion: There is a moderate size defect in the apex seen at rest and stress. While there is a fixed component, there is a small to moderate region of reversibility within this defect. Wall Motion: Poor thickening in the region of the myocardial defect. Other walls move normally. Left Ventricular Ejection Fraction: 56 % End diastolic volume 101 ml End systolic volume 44  ml IMPRESSION: 1. There is a moderate sized defect centered in the apex extending into the low anterior wall. While there is a fixed component consistent with infarct, there is a small to moderate region demonstrating reversibility as well consistent with associated ischemia. . 2. Poor thickening in the region of the myocardial defect centered at the apex. 3. Left ventricular ejection fraction 56% 4. Non invasive risk stratification*: Intermediate *2012 Appropriate Use Criteria for Coronary Revascularization Focused Update: J Am Coll Cardiol. 2012;59(9):857-881. http://content.dementiazones.comonlinejacc.org/article.aspx?articleid=1201161 Electronically Signed   By: Gerome Samavid  Williams III M.D   On: 09/18/2016 12:56   Nm Pulmonary Perf And Vent  Result Date: 09/16/2016 CLINICAL DATA:  Chest pain with elevated D-dimer EXAM: NUCLEAR MEDICINE VENTILATION - PERFUSION LUNG SCAN VIEWS: Anterior, posterior, left lateral, right lateral, RPO, LPO, RAO, LAO -ventilation and profusion RADIOPHARMACEUTICALS:  32.7 mCi Technetium-7178m DTPA aerosol inhalation and 3.8 mCi Technetium-3578m MAA IV COMPARISON:  Chest radiograph September 15, 2016. FINDINGS: Ventilation: There is no segmental or significant subsegmental ventilation defect. A few rather minimal areas of inhomogeneity are noted on the ventilation study, likely due to what appears to be a degree of underlying COPD. Perfusion: There is no segmental or significant subsegmental perfusion defect.  There are a few rather tiny matching ventilation and profusion defects, likely due to underlying COPD. No significant ventilation/ perfusion mismatch. IMPRESSION: Findings suggesting a degree of underlying COPD. No appreciable ventilation/ perfusion mismatch. No segmental or significant subsegmental perfusion defects. This study constitutes a low probability of pulmonary embolus. Electronically Signed   By: Bretta BangWilliam  Woodruff III M.D.   On: 09/16/2016 11:49   Dg Chest Port 1 View  Result Date: 09/21/2016 CLINICAL DATA:  Wheezing, followup EXAM: PORTABLE CHEST 1 VIEW COMPARISON:  Chest x-ray of 09/19/2016 and chest x-ray of 09/15/2016 FINDINGS: There has been some minimal improvement in the extensive airspace disease throughout much of the left lung. Some opacities remain peripherally in the left mid lung and at the left lung base. There do appear to be small pleural effusions present with bibasilar linear atelectasis noted. Cardiomegaly is stable. No acute bony abnormality is seen. IMPRESSION: 1. Some interval improvement in left lung airspace disease when compared to the prior CT. 2. Persistent opacities in the left mid lung and left lung base consistent with residual pneumonia. 3. Small pleural effusions Electronically Signed   By: Dwyane DeePaul  Barry M.D.   On: 09/21/2016 11:14    Microbiology: Recent Results (from the past 240 hour(s))  MRSA PCR Screening     Status: None   Collection Time: 09/19/16  3:37 PM  Result Value Ref Range Status   MRSA by PCR NEGATIVE NEGATIVE Final    Comment:        The GeneXpert MRSA Assay (FDA approved for NASAL specimens only), is one component of a comprehensive MRSA colonization surveillance program. It is not intended to diagnose MRSA infection nor to guide or monitor treatment for MRSA infections.   Culture, expectorated sputum-assessment     Status: None   Collection Time: 09/19/16  3:54 PM  Result Value Ref Range Status   Specimen Description SPUTUM  Final    Special Requests Normal  Final   Sputum evaluation THIS SPECIMEN IS ACCEPTABLE FOR SPUTUM CULTURE  Final   Report Status 09/19/2016 FINAL  Final  Culture, respiratory (NON-Expectorated)     Status: None   Collection Time: 09/19/16  3:54 PM  Result Value Ref Range Status   Specimen Description SPUTUM  Final   Special Requests Normal  Reflexed from [id number]  Final   Gram Stain   Final    RARE WBC PRESENT, PREDOMINANTLY PMN RARE GRAM NEGATIVE RODS RARE GRAM POSITIVE COCCI IN PAIRS RARE YEAST RARE SQUAMOUS EPITHELIAL CELLS PRESENT    Culture   Final    Consistent with normal respiratory flora. Performed at [facility], [address] [place], [place] [address]    Report Status 09/22/2016 FINAL  Final     Labs: Basic Metabolic Panel:  Recent Labs Lab 09/21/16 2039 09/22/16 0238 09/22/16 0727 09/22/16 1212 09/24/16 0443  NA 117* 118* 118* 120* 119*  K 4.9 5.4* 5.3* 5.9* 4.4  CL 89* 90* 90* 89* 89*  CO2 18* 18* 20* 21* 17*  GLUCOSE 115* 100* 93 88 67  BUN 39* 41* 44* 46* 57*  CREATININE 3.59* 3.66* 3.89* 4.15* 4.90*  CALCIUM 8.3* 8.3* 8.5* 8.7* 8.1*   Liver Function Tests:  Recent Labs Lab 09/20/16 0451  AST 29  ALT 13*  ALKPHOS 71  BILITOT 0.6  PROT 6.5  ALBUMIN 2.6*   No results for input(s): LIPASE, AMYLASE in the last 168 hours. No results for input(s): AMMONIA in the last 168 hours. CBC:  Recent Labs Lab 09/20/16 0451 09/24/16 0443  WBC 10.5 18.6*  HGB 9.2* 8.2*  HCT 27.7* 23.6*  MCV 84.2 80.5  PLT 247 316   Cardiac Enzymes: No results for input(s): CKTOTAL, CKMB, CKMBINDEX, TROPONINI in the last 168 hours. BNP: BNP (last 3 results) No results for input(s): BNP in the last 8760 hours.  ProBNP (last 3 results) No results for input(s): PROBNP in the last 8760 hours.  CBG: No results for input(s): GLUCAP in the last 168 hours.

## 2016-09-25 NOTE — Discharge Instructions (Signed)
Hyponatremia Hyponatremia is when the amount of salt (sodium) in your blood is too low. When salt levels are low, your cells absorb extra water and they swell. The swelling happens throughout the body, but it mostly affects the brain. Follow these instructions at home:  Take medicines only as told by your doctor. Many medicines can make this condition worse. Talk with your doctor about any medicines that you are currently taking.  Carefully follow a recommended diet as told by your doctor.  Carefully follow instructions from your doctor about fluid restrictions.  Keep all follow-up visits as told by your doctor. This is important.  Do not drink alcohol. Contact a doctor if:  You feel sicker to your stomach (nauseous).  You feel more confused.  You feel more tired (fatigued).  Your headache gets worse.  You feel weaker.  Your symptoms go away and then they come back.  You have trouble following the diet instructions. Get help right away if:  You start to twitch and shake (have a seizure).  You pass out (faint).  You keep having watery poop (diarrhea).  You keep throwing up (vomiting). This information is not intended to replace advice given to you by your health care provider. Make sure you discuss any questions you have with your health care provider. Document Released: 09/23/2010 Document Revised: 06/19/2015 Document Reviewed: 01/07/2014 Elsevier Interactive Patient Education  2018 Elsevier Inc.  

## 2016-09-25 NOTE — Progress Notes (Signed)
[facility] Liaison:  RN visit  Received request from Butch Penny, [place], for family interest in [facility] with request for transfer today.  Chart reviewed.  Met with patient and Minette Brine, niece, to confirm interest and explain services.  Family agreeable to transfer today.  Butch Penny, CSW, aware.  Registration paperwork completed.  Dr. Orpah Melter to assume care per family request.  Please fax discharge summary to [phone].  RN, please call report to [phone].  Please arrange transport for patient to arrive as soon as possible.    Thank you for the referral.  Edyth Gunnels, RN, BSN [facility] Liaison [phone]  All hospital liaisons are now on [place].

## 2016-09-25 NOTE — Clinical Social Work Note (Signed)
Late Entry: SW notified by nursing earlier today that MD had completed DC summary for patient for placement at [facility].  SW contacted daughter Bobbye Morton who confirmed desire for [facility].  Referral completed to Modena Morrow, [facility] Liaison who met with daughter and completed admission paperwork.  Nursing notified to call report to [facility]; EMS arranged for transport. Daughter was pleased with d/c plan;  Patient is alert and oriented to person. No frther SW needs identified. CSW signing off.  Lorie Phenix. Pauline Good, [place] (weekend coverage)

## 2016-10-06 ENCOUNTER — Ambulatory Visit: Payer: Medicare Other | Admitting: Physician Assistant

## 2016-10-06 NOTE — Progress Notes (Deleted)
Cardiology Office Note    Date:  10/06/2016   ID:  NIREL BABLER, DOB 1922/12/20, MRN [id number]  PCP:  Wenda Low, MD  Cardiologist:  Dr. Martinique  No chief complaint on file.   History of Present Illness:  Alicia [name] is a 83 y.o. female with PMH of pulm fibrosis, anemia, HTN, HLD, tobacco and takotsubo cardiomyopathy in 2016. Cardiac catheterization in 2016 demonstrated mild nonobstructive CAD. Echocardiogram showed apical akinesis consistent with stress-induced cardiomyopathy with EF 40%. Her ejection fraction has normalized on repeat echocardiogram in September 2016 with EF 55-60%. Since then, patient has stopped beta blocker on her own due to intolerance. She recently presented to the hospital with chest pain on 09/13/2016. EKG showed anterolateral T-wave inversion. Cardiology was consulted. Initial troponin was elevated at 0.89. D-dimer was also elevated at 7.13. Echocardiogram obtained on 09/15/2016 showed EF 55%, apical inferior, apical anterior, apical septal mild hypokinesis, grade 1 DD, mild-to-moderate TR, PA peak pressure 52 mmHg. VQ scan obtained on 09/16/2016 was low probability for PE, however finding does suggest a degree of underlying COPD. she underwent Myoview on 09/18/2016 which showed EF 56%, moderate size defect centered in the apex extending into the low anterior wall, there is a fixed component consistent with infarct, however there is also small to moderate region demonstrating reversibility consistent with ischemia. Since Myoview was not high risk, medical management was recommended. CT of chest without contrast obtained on the following day showed diffuse interstitial and airspace disease in the left lung most suggestive of pneumonia. She was treated with antibiotics. During this admission, patient had worsening anemia and worsening renal function. Hemoglobin trended down from 10.6 on admission down to 8.2 while creatinine trended up from 1.5 on admission up to  4.9.  Yes EKG CBC and BMET   Past Medical History:  Diagnosis Date  . Anemia   . Arthritis   . CAD (coronary artery disease)    a.  LHC 7/16:  prox to mid LAD 30, OM1 30, OM3 30, pRCA 40  . Carotid stenosis    a.  Carotid US 7/16:  bilat ICA 1-39%  . Cervical spinal stenosis   . Chronic constipation   . CKD (chronic kidney disease)   . H/O Legionnaire's disease   . Hyperlipidemia   . Hypertension   . Peripheral edema   . Pneumonia   . Postinflammatory pulmonary fibrosis ([place])   . Takotsubo cardiomyopathy 07/2014   NSTEMI 07/2014 >>   a. Echo 7/16:  apical AK c/w stress induced CM, EF 40%, no clot, mod TR, PASP 38 mmHg;  b. Limited echo 9/16: EF 55-60%, normal wall motion, grade 1 diastolic dysfunction, MAC, mild LAE, normal RV function, mild RAE, PASP 38  . Upper airway cough syndrome     Past Surgical History:  Procedure Laterality Date  . ABDOMINAL HYSTERECTOMY    . CARDIAC CATHETERIZATION N/A 08/16/2014   Procedure: Left Heart Cath and Coronary Angiography;  Surgeon: Jolaine Artist, MD;  Location: [facility];  Service: Cardiovascular;  Laterality: N/A;  . CHOLECYSTECTOMY      Current Medications: Outpatient Medications Prior to Visit  Medication Sig Dispense Refill  . acetaminophen (TYLENOL) 325 MG tablet Take 2 tablets (650 mg total) by mouth every 6 (six) hours as needed for mild pain, fever or headache. 30 tablet 0  . feeding supplement (BOOST HIGH PROTEIN) LIQD Take 1 Container by mouth 2 (two) times daily between meals.    . haloperidol (HALDOL)  0.5 MG tablet Take 1 tablet (0.5 mg total) by mouth every 4 (four) hours as needed for agitation (or delirium). 30 tablet 0  . LORazepam (ATIVAN) 0.5 MG tablet Take 1 tablet (0.5 mg total) by mouth every 4 (four) hours as needed for anxiety. 30 tablet 0  . sodium chloride (MURO 128) 5 % ophthalmic solution Place 1-2 drops into both eyes as needed for eye irritation.     No facility-administered medications prior  to visit.      Allergies:   Tylenol 8 hour [acetaminophen]; Aspirin; and Penicillins   Social History   Social History  . Marital status: Married    Spouse name: N/A  . Number of children: N/A  . Years of education: N/A   Social History Main Topics  . Smoking status: Former Smoker    Packs/day: 0.50    Years: 36.00    Types: Cigarettes    Quit date: 01/25/1977  . Smokeless tobacco: Never Used  . Alcohol use No  . Drug use: No  . Sexual activity: Not on file   Other Topics Concern  . Not on file   Social History Narrative  . No narrative on file     Family History:  The patient's ***family history includes Cancer in her mother; Healthy in her brother, brother, father, and sister; Lung cancer in her brother, brother, and brother.   ROS:   Please see the history of present illness.    ROS All other systems reviewed and are negative.   PHYSICAL EXAM:   VS:  There were no vitals taken for this visit.   GEN: Well nourished, well developed, in no acute distress  HEENT: normal  Neck: no JVD, carotid bruits, or masses Cardiac: ***RRR; no murmurs, rubs, or gallops,no edema  Respiratory:  clear to auscultation bilaterally, normal work of breathing GI: soft, nontender, nondistended, + BS MS: no deformity or atrophy  Skin: warm and dry, no rash Neuro:  Alert and Oriented x 3, Strength and sensation are intact Psych: euthymic mood, full affect  Wt Readings from Last 3 Encounters:  09/21/16 127 lb 6.8 oz (57.8 kg)  01/15/15 130 lb 6.4 oz (59.1 kg)  10/21/14 129 lb 6.4 oz (58.7 kg)      Studies/Labs Reviewed:   EKG:  EKG is*** ordered today.  The ekg ordered today demonstrates ***  Recent Labs: 09/20/2016: ALT 13 09/21/2016: TSH 3.162 09/24/2016: BUN 57; Creatinine, Ser 4.90; Hemoglobin 8.2; Platelets 316; Potassium 4.4; Sodium 119   Lipid Panel    Component Value Date/Time   CHOL 121 09/17/2016 0503   TRIG 80 09/17/2016 0503   HDL 36 (L) 09/17/2016 0503   CHOLHDL  3.4 09/17/2016 0503   VLDL 16 09/17/2016 0503   LDLCALC 69 09/17/2016 0503    Additional studies/ records that were reviewed today include:   Echo 09/16/2016 LV EF: 55%  Study Conclusions  - Left ventricle: The cavity size was normal. Wall thickness was   increased in a pattern of mild LVH. The estimated ejection   fraction was 55%. There appears to be apical inferior, apical   anterior, and apical septal mild hypokinesis. Doppler parameters   are consistent with abnormal left ventricular relaxation (grade 1   diastolic dysfunction). - Aortic valve: There was no stenosis. - Mitral valve: There was no significant regurgitation. - Left atrium: The atrium was moderately dilated. - Right ventricle: The cavity size was mildly dilated. Systolic   function was normal. - Tricuspid valve: There  was mild-moderate regurgitation. Peak   RV-RA gradient (S): 49 mm Hg. - Pulmonary arteries: PA peak pressure: 52 mm Hg (S). - Inferior vena cava: The vessel was normal in size. The   respirophasic diameter changes were in the normal range (= 50%),   consistent with normal central venous pressure. - Pericardium, extracardiac: A trivial pericardial effusion was   identified.  Impressions:  - Normal LV size with mild LV hypertrophy. EF 55% with apical   inferior/anterior/septal mild hypokinesis. Mildly dilated RV with   normal systolic function. Moderate left atrial enlargement. Mild   to moderate TR. Moderate pulmonary hypertension.   VQ scan 09/16/2016  IMPRESSION: Findings suggesting a degree of underlying COPD. No appreciable ventilation/ perfusion mismatch. No segmental or significant subsegmental perfusion defects. This study constitutes a low probability of pulmonary embolus.   Myoview 09/18/2016 IMPRESSION: 1. There is a moderate sized defect centered in the apex extending into the low anterior wall. While there is a fixed component consistent with infarct, there is a small to  moderate region demonstrating reversibility as well consistent with associated ischemia.  2. Poor thickening in the region of the myocardial defect centered at the apex.  3. Left ventricular ejection fraction 56%  4. Non invasive risk stratification*: Intermediate   CT of chest wo contrast 09/19/2016 IMPRESSION: 1. Diffuse interstitial and airspace disease in the left lung, most suggestive of pneumonia. Disease is substantially progressed since the chest x-ray of 09/15/2016.   ASSESSMENT:    No diagnosis found.   PLAN:  In order of problems listed above:  1. ***    Medication Adjustments/Labs and Tests Ordered: Current medicines are reviewed at length with the patient today.  Concerns regarding medicines are outlined above.  Medication changes, Labs and Tests ordered today are listed in the Patient Instructions below. There are no Patient Instructions on file for this visit.   Alicia [name], [place]  10/06/2016 7:12 AM    [address], [place], [place]  [address] Phone: [phone]; Fax: [phone]

## 2016-10-07 ENCOUNTER — Encounter: Payer: Self-pay | Admitting: *Deleted

## 2016-10-25 DEATH — deceased

## 2018-02-22 IMAGING — CT CT CHEST W/O CM
2 of 3 series · 15 of 36 positions shown, 18 images · non-contrast
Comparison: Chest x-ray 09/15/2016

CLINICAL DATA: Chest pain

EXAM:
CT CHEST WITHOUT CONTRAST
TECHNIQUE: Multidetector CT imaging of the chest was performed following the
standard protocol without IV contrast.

[Series 2: thorax · axial · 0.69mm/px · z∈[+1112,+1382]mm · 12 of 159 slices shown, 15 images]
[im 12/159  mediastinal]
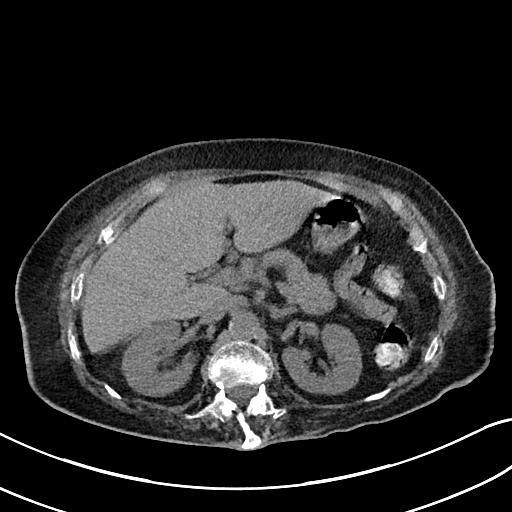
[im 12/159  lung]
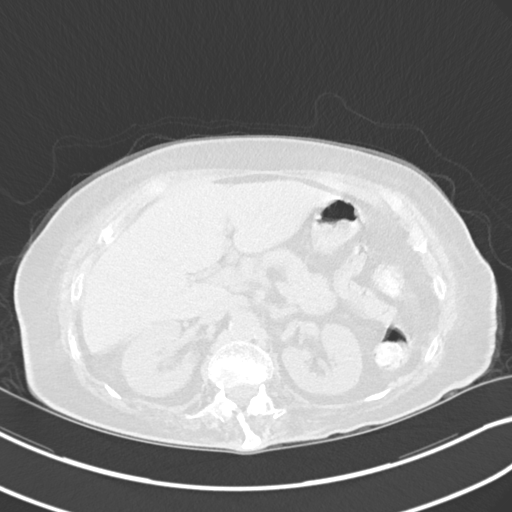
[im 24/159  lung]
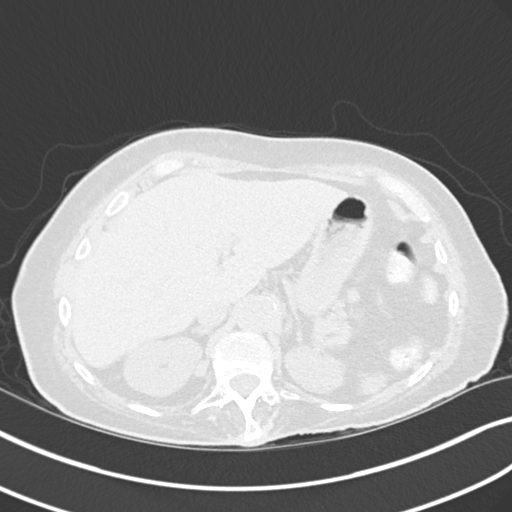
[im 36/159  lung]
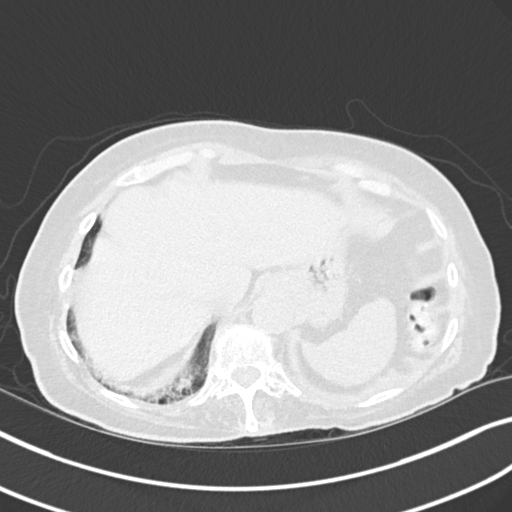
[im 47/159  lung]
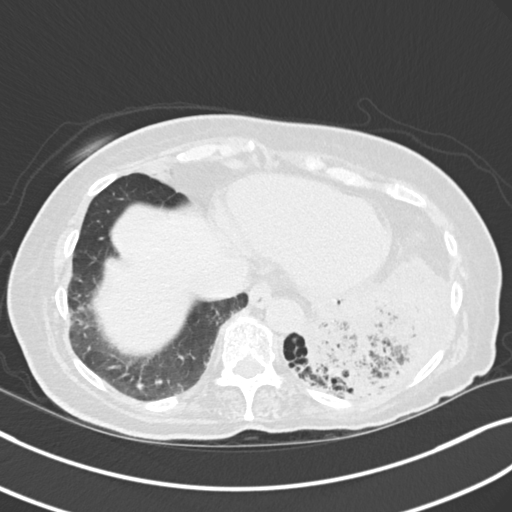
[im 59/159  mediastinal]
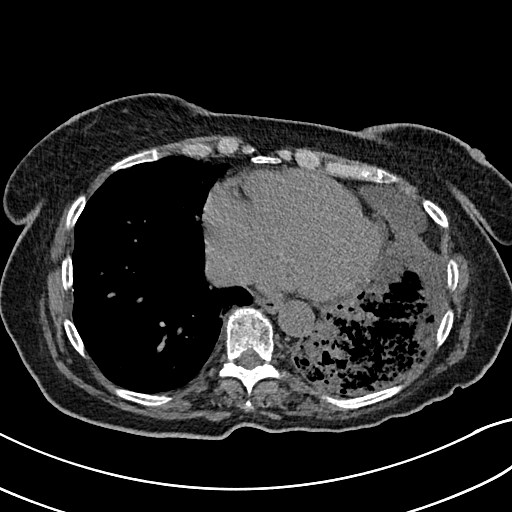
[im 59/159  lung]
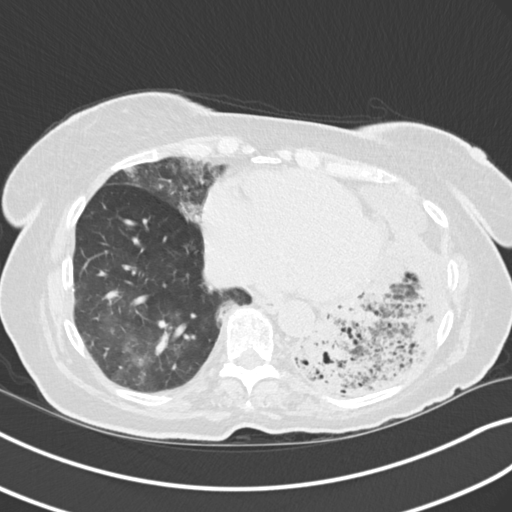
[im 71/159  lung]
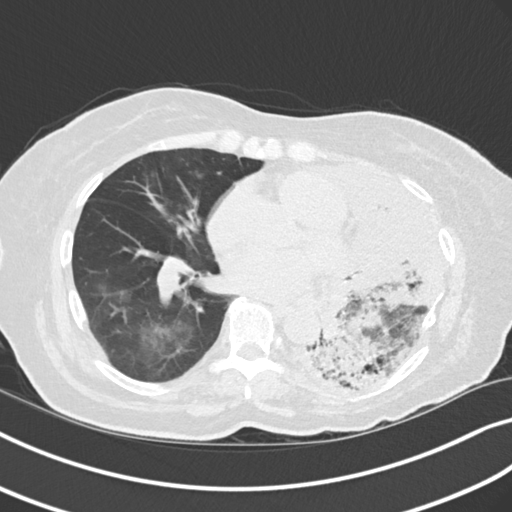
[im 88/159  lung]
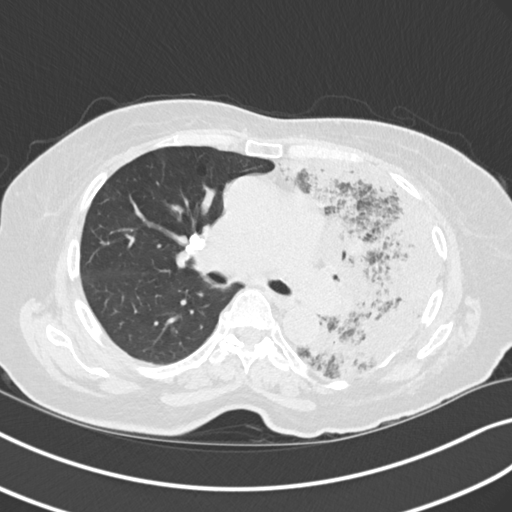
[im 100/159  lung]
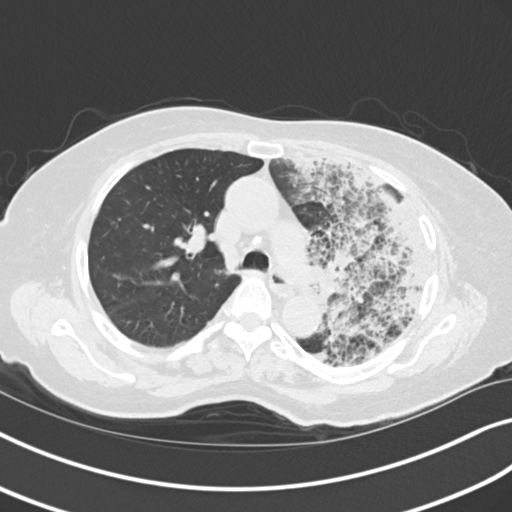
[im 112/159  mediastinal]
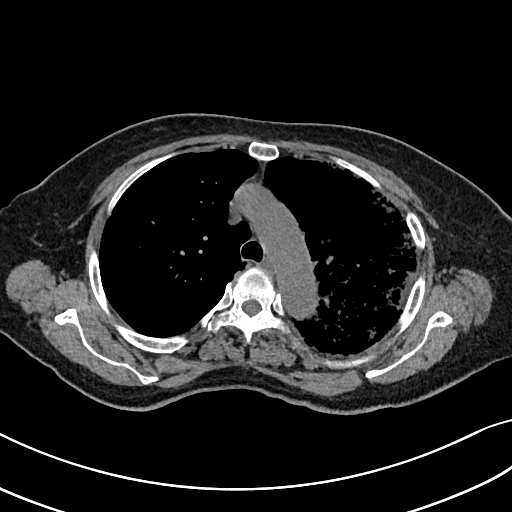
[im 112/159  lung]
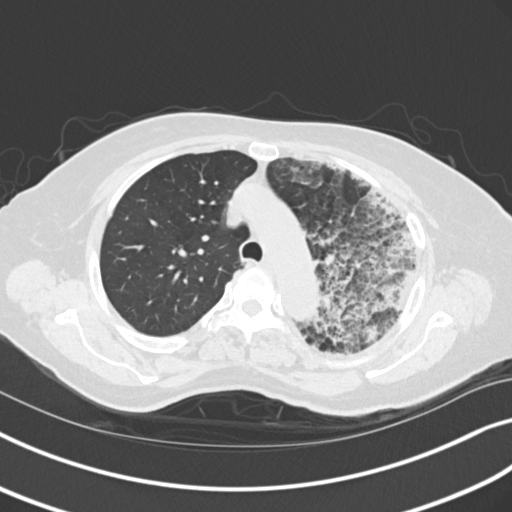
[im 123/159  lung]
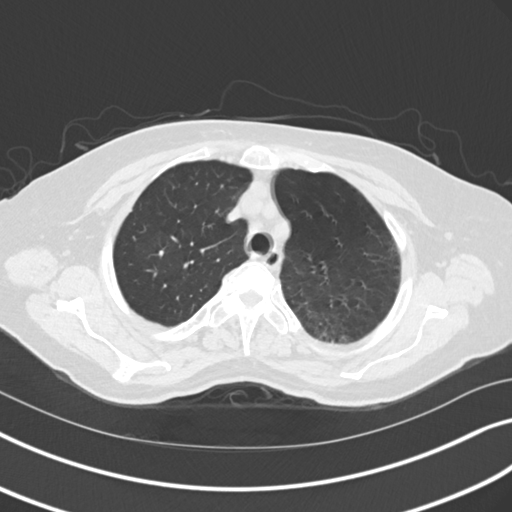
[im 135/159  lung]
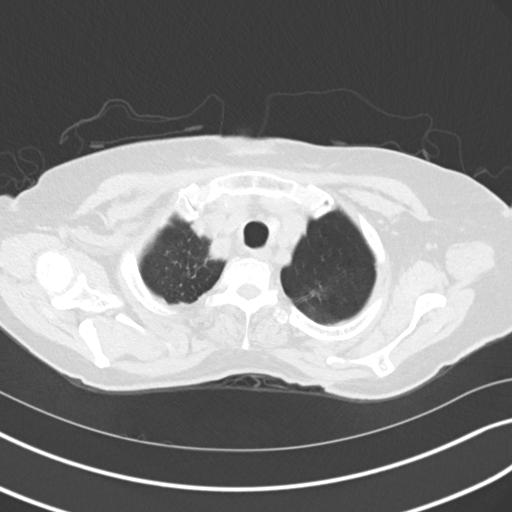
[im 147/159  lung]
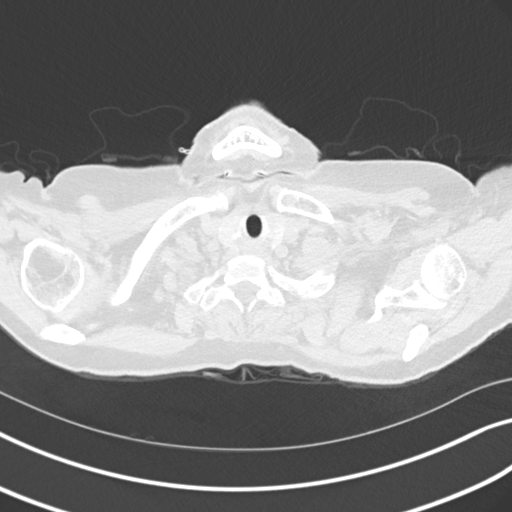

[Series 5: coronal · coronal · 0.62mm/px · 3 of 150 slices shown]
[im 30/150  lung]
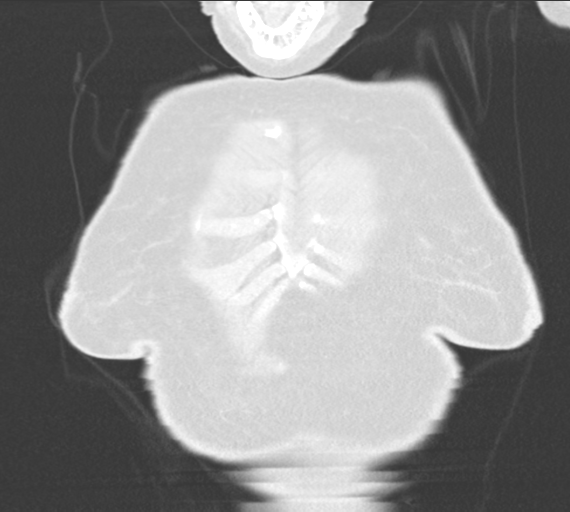
[im 60/150  lung]
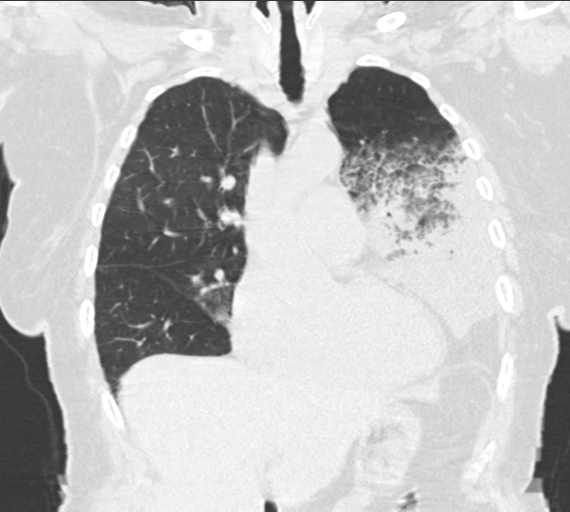
[im 90/150  lung]
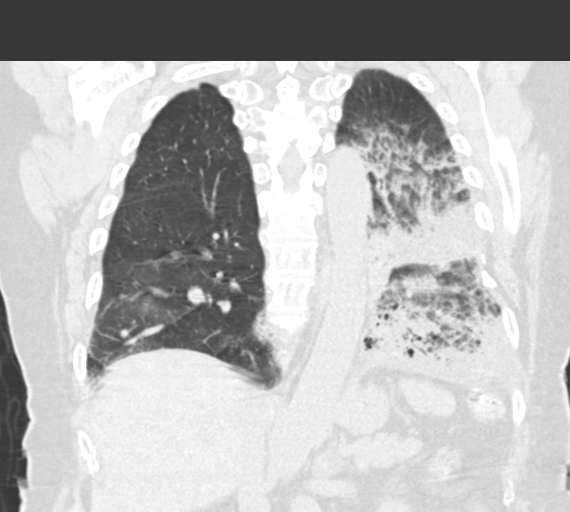

[15 of 36 positions shown; findings below may reference images not displayed]

FINDINGS: Cardiovascular: Heart size upper normal. No pericardial effusion. No
thoracic aortic aneurysm.

Mediastinum/Nodes: Scattered small mediastinal lymph nodes.
Calcified lymph nodes are seen in the right hilum. Abnormal soft
tissue in the left hilum likely related to the associated left lung
airspace disease. The esophagus has normal imaging features. There
is no axillary lymphadenopathy.

Lungs/Pleura: Hyperexpansion is consistent with emphysema. There is
diffuse interstitial and airspace disease in the the left upper and
lower lobe with patchy central ground-glass attenuation right middle
and lower lobes. 6 mm ill-defined nodule posterior right lower lobe
has other associated smaller ground-glass nodules.

Upper Abdomen: Unremarkable.

Musculoskeletal: Bone windows reveal no worrisome lytic or sclerotic
osseous lesions.
IMPRESSION: 1. Diffuse interstitial and airspace disease in the left lung, most
suggestive of pneumonia. Disease is substantially progressed since
the chest x-ray of 09/15/2016.
